# Patient Record
Sex: Female | Born: 1965 | Race: White | Hispanic: No | Marital: Married | Smoking: Never smoker
Health system: Southern US, Community
[De-identification: ages and names within clinical notes are randomized; demographics above are authoritative.]

## PROBLEM LIST (undated history)

## (undated) DIAGNOSIS — Z803 Family history of malignant neoplasm of breast: Secondary | ICD-10-CM

## (undated) DIAGNOSIS — C801 Malignant (primary) neoplasm, unspecified: Secondary | ICD-10-CM

## (undated) DIAGNOSIS — Z9221 Personal history of antineoplastic chemotherapy: Secondary | ICD-10-CM

## (undated) DIAGNOSIS — I10 Essential (primary) hypertension: Secondary | ICD-10-CM

## (undated) DIAGNOSIS — R011 Cardiac murmur, unspecified: Secondary | ICD-10-CM

## (undated) DIAGNOSIS — D759 Disease of blood and blood-forming organs, unspecified: Secondary | ICD-10-CM

## (undated) HISTORY — PX: OTHER SURGICAL HISTORY: SHX169

## (undated) HISTORY — PX: TUBAL LIGATION: SHX77

## (undated) HISTORY — DX: Family history of malignant neoplasm of breast: Z80.3

---

## 2015-01-17 ENCOUNTER — Emergency Department (INDEPENDENT_AMBULATORY_CARE_PROVIDER_SITE_OTHER)
Admission: EM | Admit: 2015-01-17 | Discharge: 2015-01-17 | Disposition: A | Discharge status: Home / Self Care | Payer: No Typology Code available for payment source | Source: Home / Self Care | Attending: Emergency Medicine | Admitting: Emergency Medicine

## 2015-01-17 ENCOUNTER — Encounter (HOSPITAL_COMMUNITY): Payer: Self-pay | Admitting: *Deleted

## 2015-01-17 DIAGNOSIS — I10 Essential (primary) hypertension: Secondary | ICD-10-CM

## 2015-01-17 HISTORY — DX: Essential (primary) hypertension: I10

## 2015-01-17 LAB — POCT I-STAT, CHEM 8
BUN: 18 mg/dL (ref 6–23)
CALCIUM ION: 1.19 mmol/L (ref 1.12–1.23)
Chloride: 105 mmol/L (ref 96–112)
Creatinine, Ser: 0.9 mg/dL (ref 0.50–1.10)
GLUCOSE: 100 mg/dL — AB (ref 70–99)
HCT: 42 % (ref 36.0–46.0)
Hemoglobin: 14.3 g/dL (ref 12.0–15.0)
POTASSIUM: 3.8 mmol/L (ref 3.5–5.1)
SODIUM: 141 mmol/L (ref 135–145)
TCO2: 22 mmol/L (ref 0–100)

## 2015-01-17 MED ORDER — HYDROCHLOROTHIAZIDE 25 MG PO TABS: 25.0000 mg | ORAL_TABLET | Freq: Every day | ORAL | Status: DC

## 2015-01-17 NOTE — Discharge Instructions (Signed)
We are going to change her blood pressure medicine to hydrochlorothiazide. Take 1 pill daily. Check your blood pressure at home several times a week. If it is consistently over 140/90 please come back for medication adjustment. Follow-up with your new doctor as scheduled next month.

## 2015-01-17 NOTE — ED Notes (Signed)
Pt   Requesting  A  Refill  Of  Her  BP   meds          She  Reports  Last  Pm   She  Had  A  Headache     And her  bp is  Elevated     -   She  Is  New  To  The  Area  And  Is   Between  Doctors

## 2015-01-17 NOTE — ED Provider Notes (Signed)
CSN: [id number]     Arrival date & time 01/17/15  1141 History   First MD Initiated Contact with Patient 01/17/15 1153     Chief Complaint  Patient presents with  . Medication Refill   (Consider location/radiation/quality/duration/timing/severity/associated sxs/prior Treatment) HPI She is a 49 year old woman here for medication refill. She is on methyldopa for hypertension. This was started while she was pregnant with her younger son. Her son is now 80 years old and recently stopped nursing. Her prescription is for 500 mg 4 times a day, but she has been taking it twice a day since she realized she was going to have difficulty getting it refilled. She was doing fine on the twice a day dosing, until last night when she felt "bad." She took her blood pressure and it was 158/108. She took a third methyldopa last night, and took her last methyldopa this morning.  She is new to [place]. She are family are likely moving here, but her doctor is still located in [place]. She was unable to get a refill through her doctor in [place].  Past Medical History  Diagnosis Date  . Hypertension    History reviewed. No pertinent past surgical history. History reviewed. No pertinent family history. History  Substance Use Topics  . Smoking status: Never Smoker   . Smokeless tobacco: Not on file  . Alcohol Use: Yes   OB History    No data available     Review of Systems  Eyes: Negative.   Respiratory: Negative.   Cardiovascular: Negative.   Neurological: Negative.     Allergies  Review of patient's allergies indicates no known allergies.  Home Medications   Prior to Admission medications   Medication Sig Start Date End Date Taking? Authorizing Provider  hydrochlorothiazide (HYDRODIURIL) 25 MG tablet Take 1 tablet (25 mg total) by mouth daily. 01/17/15   Melony Overly, MD   BP 132/90 mmHg  Pulse 93  Temp(Src) 99.2 F (37.3 C) (Oral)  Resp 18  SpO2 97%  LMP 12/19/2014 Physical Exam   Constitutional: She is oriented to person, place, and time. She appears well-developed and well-nourished. No distress.  Cardiovascular: Normal rate.   Pulmonary/Chest: Effort normal.  Neurological: She is alert and oriented to person, place, and time.    ED Course  Procedures (including critical care time) Labs Review Labs Reviewed  POCT I-STAT, CHEM 8 - Abnormal; Notable for the following:    Glucose, Bld 100 (*)    All other components within normal limits    Imaging Review No results found.   MDM   1. Essential hypertension    As she is no longer nursing, will change to hydrochlorothiazide 25 mg daily. She has an appointment with a doctor here on March 9. She will monitor her blood pressure at home and follow-up if it is over 140/90.     Melony Overly, MD 01/17/15 1247

## 2015-06-11 ENCOUNTER — Other Ambulatory Visit: Payer: Self-pay

## 2015-06-11 DIAGNOSIS — Z1231 Encounter for screening mammogram for malignant neoplasm of breast: Secondary | ICD-10-CM

## 2015-06-30 ENCOUNTER — Ambulatory Visit
Admission: RE | Admit: 2015-06-30 | Discharge: 2015-06-30 | Disposition: A | Discharge status: Home / Self Care | Payer: No Typology Code available for payment source | Source: Ambulatory Visit

## 2015-06-30 DIAGNOSIS — Z1231 Encounter for screening mammogram for malignant neoplasm of breast: Secondary | ICD-10-CM

## 2016-10-30 ENCOUNTER — Other Ambulatory Visit: Payer: Self-pay | Admitting: Obstetrics & Gynecology

## 2016-10-30 ENCOUNTER — Other Ambulatory Visit (HOSPITAL_COMMUNITY)
Admission: RE | Admit: 2016-10-30 | Discharge: 2016-10-30 | Disposition: A | Discharge status: Home / Self Care | Payer: No Typology Code available for payment source | Source: Ambulatory Visit | Attending: Obstetrics & Gynecology | Admitting: Obstetrics & Gynecology

## 2016-10-30 DIAGNOSIS — Z01419 Encounter for gynecological examination (general) (routine) without abnormal findings: Secondary | ICD-10-CM | POA: Insufficient documentation

## 2016-10-30 DIAGNOSIS — Z1151 Encounter for screening for human papillomavirus (HPV): Secondary | ICD-10-CM | POA: Insufficient documentation

## 2016-11-06 LAB — CYTOLOGY - PAP
DIAGNOSIS: NEGATIVE
HPV: NOT DETECTED

## 2017-12-11 DIAGNOSIS — C50919 Malignant neoplasm of unspecified site of unspecified female breast: Secondary | ICD-10-CM

## 2017-12-11 DIAGNOSIS — Z923 Personal history of irradiation: Secondary | ICD-10-CM

## 2017-12-11 DIAGNOSIS — C801 Malignant (primary) neoplasm, unspecified: Secondary | ICD-10-CM

## 2017-12-11 HISTORY — DX: Malignant neoplasm of unspecified site of unspecified female breast: C50.919

## 2017-12-11 HISTORY — DX: Personal history of irradiation: Z92.3

## 2017-12-11 HISTORY — DX: Malignant (primary) neoplasm, unspecified: C80.1

## 2017-12-11 HISTORY — PX: BREAST LUMPECTOMY: SHX2

## 2018-01-08 ENCOUNTER — Other Ambulatory Visit: Payer: Self-pay | Admitting: Obstetrics & Gynecology

## 2018-01-08 DIAGNOSIS — Z1231 Encounter for screening mammogram for malignant neoplasm of breast: Secondary | ICD-10-CM

## 2018-01-29 ENCOUNTER — Ambulatory Visit
Admission: RE | Admit: 2018-01-29 | Discharge: 2018-01-29 | Disposition: A | Discharge status: Home / Self Care | Payer: BC Managed Care – PPO | Source: Ambulatory Visit | Attending: Obstetrics & Gynecology | Admitting: Obstetrics & Gynecology

## 2018-01-29 DIAGNOSIS — Z1231 Encounter for screening mammogram for malignant neoplasm of breast: Secondary | ICD-10-CM

## 2018-01-30 ENCOUNTER — Other Ambulatory Visit: Payer: Self-pay | Admitting: Obstetrics & Gynecology

## 2018-01-30 DIAGNOSIS — R928 Other abnormal and inconclusive findings on diagnostic imaging of breast: Secondary | ICD-10-CM

## 2018-02-05 ENCOUNTER — Other Ambulatory Visit: Payer: Self-pay | Admitting: Obstetrics & Gynecology

## 2018-02-05 ENCOUNTER — Ambulatory Visit
Admission: RE | Admit: 2018-02-05 | Discharge: 2018-02-05 | Disposition: A | Discharge status: Home / Self Care | Payer: BC Managed Care – PPO | Source: Ambulatory Visit | Attending: Obstetrics & Gynecology | Admitting: Obstetrics & Gynecology

## 2018-02-05 ENCOUNTER — Other Ambulatory Visit: Payer: BC Managed Care – PPO

## 2018-02-05 DIAGNOSIS — R928 Other abnormal and inconclusive findings on diagnostic imaging of breast: Secondary | ICD-10-CM

## 2018-02-05 DIAGNOSIS — N6310 Unspecified lump in the right breast, unspecified quadrant: Secondary | ICD-10-CM

## 2018-02-07 ENCOUNTER — Ambulatory Visit
Admission: RE | Admit: 2018-02-07 | Discharge: 2018-02-07 | Disposition: A | Discharge status: Home / Self Care | Payer: BC Managed Care – PPO | Source: Ambulatory Visit | Attending: Obstetrics & Gynecology | Admitting: Obstetrics & Gynecology

## 2018-02-07 ENCOUNTER — Other Ambulatory Visit: Payer: Self-pay | Admitting: Obstetrics & Gynecology

## 2018-02-07 DIAGNOSIS — N6310 Unspecified lump in the right breast, unspecified quadrant: Secondary | ICD-10-CM

## 2018-02-07 HISTORY — PX: BREAST BIOPSY: SHX20

## 2018-02-08 ENCOUNTER — Other Ambulatory Visit: Payer: Self-pay | Admitting: Internal Medicine

## 2018-02-12 ENCOUNTER — Other Ambulatory Visit: Payer: Self-pay | Admitting: Surgery

## 2018-02-12 DIAGNOSIS — Z853 Personal history of malignant neoplasm of breast: Secondary | ICD-10-CM

## 2018-02-13 ENCOUNTER — Encounter: Payer: Self-pay | Admitting: Radiation Oncology

## 2018-02-13 ENCOUNTER — Telehealth: Payer: Self-pay | Admitting: Oncology

## 2018-02-13 NOTE — Telephone Encounter (Signed)
Appt has been scheduled for the pt to see Dr. Jana Hakim on 3/26 at 4pm. A genetic counseling appt has been scheduled for the pt to see Ferol Luz on 3/26 at 11am. Pt has agreed to the appts. Aware to arrive 30 minutes early for her first appt. Letter mailed.

## 2018-02-14 ENCOUNTER — Other Ambulatory Visit: Payer: Self-pay | Admitting: Surgery

## 2018-02-14 DIAGNOSIS — C50911 Malignant neoplasm of unspecified site of right female breast: Secondary | ICD-10-CM

## 2018-02-21 ENCOUNTER — Ambulatory Visit
Admission: RE | Admit: 2018-02-21 | Discharge: 2018-02-21 | Disposition: A | Discharge status: Home / Self Care | Payer: BC Managed Care – PPO | Source: Ambulatory Visit | Attending: Surgery | Admitting: Surgery

## 2018-02-21 ENCOUNTER — Inpatient Hospital Stay
Admission: RE | Admit: 2018-02-21 | Discharge: 2018-02-21 | Disposition: A | Discharge status: Home / Self Care | Payer: BC Managed Care – PPO | Source: Ambulatory Visit | Attending: Surgery | Admitting: Surgery

## 2018-02-21 DIAGNOSIS — C50911 Malignant neoplasm of unspecified site of right female breast: Secondary | ICD-10-CM

## 2018-02-21 MED ORDER — GADOBENATE DIMEGLUMINE 529 MG/ML IV SOLN
18.0000 mL | Freq: Once | INTRAVENOUS | Status: AC | PRN
  Administered 2018-02-21: 18 mL via INTRAVENOUS

## 2018-02-27 ENCOUNTER — Other Ambulatory Visit: Payer: Self-pay | Admitting: Surgery

## 2018-02-27 DIAGNOSIS — Z853 Personal history of malignant neoplasm of breast: Secondary | ICD-10-CM

## 2018-02-27 NOTE — Progress Notes (Signed)
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  MRI Breast 02/21/2018: 1. Biopsy-proven invasive ductal carcinoma within the upper-outer quadrant of the right breast, with associated biopsy clip, the combination of mass and surrounding/abutting non-mass enhancement measures 2.3 x 2 x 1.9 cm. 2. No evidence of multifocal or multicentric disease within the right breast.  3. No evidence of malignancy within the left breast.  4. No evidence of metastatic lymphadenopathy.  Histology per Pathology Report: Right Breast 02/07/2018    Receptor Status: ER(+ 100%), PR (+ 20%), Her2-neu (-), Ki-67 (10%)  Did patient present with symptoms (if so, please note symptoms) or was this found on screening mammography?: Mammogram done on 01/29/2018 noted possible mass associated with distortion and calcifications in the right breast.  Past/Anticipated interventions by surgeon, if any: Right Breast Patial Mastectomy With Radioactive Seed And Sentinel Lymph Node Biopsy 03/18/2018 with Dr. Ninfa Linden  Past/Anticipated interventions by medical oncology, if any: Chemotherapy  Appointment to see Dr. Jana Hakim on 03/05/2018 at 4pm  Appointment to see Genetic Counselor on 03/05/2018 at 11 am.  Lymphedema issues, if any: No, only in her right arm in relation to the two blood clots.  Both are resolving.  Pain issues, if any: No  BP 116/85 (BP Location: Right Arm, Patient Position: Sitting, Cuff Size: Normal)   Pulse 84   Temp 98.2 F (36.8 C) (Oral)   Resp 16   Ht 5' 4"  (1.626 m)   Wt 189 lb 12.8 oz (86.1 kg)   SpO2 97%   BMI 32.58 kg/m    Wt Readings from Last 3 Encounters:  03/05/18 189 lb 12.8 oz (86.1 kg)    SAFETY ISSUES:  Prior radiation? No  Pacemaker/ICD? No  Possible current  pregnancy? No  Is the patient on methotrexate? No  Current Complaints / other details: Stopped taking birth control pills, 2 blood clots in her right wrist and elbow area.  Prescribed strong dose of aleve to resolve.  Swelling is down.     Cori Razor, RN 02/27/2018,9:16 AM

## 2018-02-28 ENCOUNTER — Other Ambulatory Visit: Payer: Self-pay | Admitting: Surgery

## 2018-02-28 DIAGNOSIS — Z853 Personal history of malignant neoplasm of breast: Secondary | ICD-10-CM

## 2018-02-28 NOTE — Progress Notes (Signed)
[place]  [phone] Fax:([phone]     ID: Anna Conrad DOB: 02/21/1966  MR#: [id number]  JOA#:[id number]  Patient Care Team: Filiberto Pinks as PCP - General (Physician Assistant) Nysha Koplin, Virgie Dad, MD as Consulting Physician (Oncology) Kyung Rudd, MD as Consulting Physician (Radiation Oncology) [name], MD as Consulting Physician (General Surgery) Janyth Pupa, DO as Consulting Physician (Obstetrics and Gynecology) Janyth Pupa, DO as Consulting Physician (Obstetrics and Gynecology) OTHER MD:  CHIEF COMPLAINT: Estrogen receptor positive breast cancer  CURRENT TREATMENT: awaiting definitive surgery   HISTORY OF CURRENT ILLNESS: Anna Conrad had routine screening mammography on 01/29/2018 showing a possible mass with associated calcifications in the right breast. She underwent unilateral right diagnostic mammography with tomography and right breast ultrasonography at [facility] on 02/05/2018 showing a highly suspicious mass over the 10:30 position upper outer of the right breast located 6 cm from the nipple measuring 1.3 x 1.4 x 1.5 cm. Associated microcalcifications corresponding to the mammographic abnormality. Ultrasound of the right axilla was normal.  Accordingly on 02/29/2019 she proceeded to biopsy of the right breast area in question. 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 Prognostic indicators significant for: estrogen receptor, 100% positive with strong staining intensity and progesterone receptor, 20% positive iwht moderate staining intensity. Proliferation marker Ki67 at 10%. HER2 not amplified with ratios HER2/CEP17 signals 1.17 and average HER2 copies per cell 2.45.  On 02/21/2018 the patient had bilateral breast MRI.  This measured the upper outer quadrant right breast mass at 2.3 cm including an area of surrounding  non-masslike enhancement.  There was no evidence of multifocal or multicentric disease, no lymphadenopathy, and no findings in the left breast.  The patient's subsequent history is as detailed below.  INTERVAL HISTORY: Anna Conrad was evaluated in the breast cancer clinic on 03/05/2018 accompanied by her husband, Anna Conrad. Her case was also presented at the multidisciplinary breast cancer conference on 02/13/2018. At that time a preliminary plan was proposed: Genetics referral, breast conserving surgery, Oncotype or Mammaprint   REVIEW OF SYSTEMS: There were no specific symptoms leading to the original mammogram, which was routinely scheduled. The patient denies unusual headaches, visual changes, nausea, vomiting, stiff neck, dizziness, or gait imbalance. There has been no cough, phlegm production, or pleurisy, no chest pain or pressure, and no change in bowel or bladder habits. The patient denies fever, rash, bleeding, unexplained fatigue or unexplained weight loss.  She does not exercise regularly.  A detailed review of systems was otherwise entirely negative.  PAST MEDICAL HISTORY: Past Medical History:  Diagnosis Date  . Family history of breast cancer   . Hypertension   She used to have migraines. She denies GERD, asthma, emphysema. She was told that she had a slight heart murmur. She denies heart palpitations.   PAST SURGICAL HISTORY: Past Surgical History:  Procedure Laterality Date  . cesearean section      2 Caesarian sections- tubal ligation at the second c-section.  FAMILY HISTORY Family History  Problem Relation Age of Onset  . Breast cancer Sister 80       approximate  . Cancer Father 52       started in shoulder, spread to spine, brain, lung  . Cancer Paternal [name]        'blood cancer'  . Heart attack Paternal Uncle   . Breast cancer Cousin        age dx unk  .  Breast cancer Cousin        age dx unk  . Breast cancer Cousin        age dx unk   The patient's father  passed away in the summer 2018 at the age of 54 due to metastatic lung cancer. He was a previous smoker. The patient's mother is alive at age 63. The patient has 1 brother and 1 sister. The patient's sister was diagnosed with breast cancer at the age of 9.  The patient has 3 paternal cousins diagnosed with breast cancer. She denies a family history of ovarian cancer.    GYNECOLOGIC HISTORY:  The patient's periods currently are about twice a month.   Menarche: 52 years old. She used to be on a dance team.  Age at first live birth: 52 years old She is [place] P2. She was taking oral contraceptives, but this has been discontinued as of January 2019. She had bilateral tubal ligation at the time of her second c-section.    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  She has a PhD.  Her husband, Anna Conrad, is a Chief Strategy Officer for [employer], making metal parts. He travels about 2-3 times per year. Their sons are Anna Conrad age 34, and Anna Conrad age 62. She attends [employer].     ADVANCED DIRECTIVES:    HEALTH MAINTENANCE: Social History   Tobacco Use  . Smoking status: Never Smoker  . Smokeless tobacco: Never Used  Substance Use Topics  . Alcohol use: Yes  . Drug use: Never     Colonoscopy:n/a  PAP: 2017/ normal  Bone density: n/a   No Known Allergies  Current Outpatient Medications  Medication Sig Dispense Refill  . hydrochlorothiazide (HYDRODIURIL) 25 MG tablet Take 1 tablet (25 mg total) by mouth daily. 30 tablet 1  . Multiple Vitamins-Minerals (MULTIVITAMIN WITH MINERALS) tablet Take 1 tablet by mouth daily.     No current facility-administered medications for this visit.     OBJECTIVE: Middle-aged white woman who appears well  Vitals:   03/05/18 1557  BP: 121/83  Pulse: 72  Resp: 18  Temp: 98.4 F (36.9 C)  SpO2: 100%     Body mass index is 32.75 kg/m.   Wt Readings from Last 3 Encounters:  03/05/18 190 lb 12.8 oz (86.5 kg)    03/05/18 189 lb 12.8 oz (86.1 kg)      ECOG FS:0 - Asymptomatic  Ocular: Sclerae unicteric, pupils round and equal Ear-nose-throat: Oropharynx clear and moist Lymphatic: No cervical or supraclavicular adenopathy Lungs no rales or rhonchi Heart regular rate and rhythm Abd soft, nontender, positive bowel sounds MSK no focal spinal tenderness, no joint edema Neuro: non-focal, well-oriented, appropriate affect Breasts: I do not palpate a mass in the right breast.  There are no skin or nipple changes of concern.  The left breast is benign.  Both axillae are benign.   LAB RESULTS:  CMP     Component Value Date/Time   NA 141 01/17/2015 1237   K 3.8 01/17/2015 1237   CL 105 01/17/2015 1237   GLUCOSE 100 (H) 01/17/2015 1237   BUN 18 01/17/2015 1237   CREATININE 0.90 01/17/2015 1237    No results found for: TOTALPROTELP, ALBUMINELP, A1GS, A2GS, BETS, BETA2SER, GAMS, MSPIKE, SPEI  No results found for: KPAFRELGTCHN, LAMBDASER, KAPLAMBRATIO  Lab Results  Component Value Date   HGB 14.3 01/17/2015   HCT 42.0 01/17/2015    _0 @  No results  found for: LABCA2  No components found for: [id number]  No results for input(s): INR in the last 168 hours.  No results found for: LABCA2  No results found for: RKY706  No results found for: CBJ628  No results found for: BTD176  No results found for: CA2729  No components found for: HGQUANT  No results found for: CEA1 / No results found for: CEA1   No results found for: AFPTUMOR  No results found for: CHROMOGRNA  No results found for: PSA1  No visits with results within 3 Day(s) from this visit.  Latest known visit with results is:  Orders Only on 10/30/2016  Component Date Value Ref Range Status  . Adequacy 10/30/2016 Satisfactory for evaluation  endocervical/transformation zone component PRESENT.   Final  . Diagnosis 10/30/2016 NEGATIVE FOR INTRAEPITHELIAL LESIONS OR MALIGNANCY.   Final  . Diagnosis  10/30/2016 A LETTER WAS SENT TO THE PATIENT INFORMING HER OF THE ABOVE RESULTS.   Final  . HPV 10/30/2016 NOT DETECTED   Final   Normal Reference Range - NOT Detected  . Material Submitted 10/30/2016 CervicoVaginal Pap [ThinPrep Imaged]   Final    (this displays the last labs from the last 3 days)  No results found for: TOTALPROTELP, ALBUMINELP, A1GS, A2GS, BETS, BETA2SER, GAMS, MSPIKE, SPEI (this displays SPEP labs)  No results found for: KPAFRELGTCHN, LAMBDASER, KAPLAMBRATIO (kappa/lambda light chains)  No results found for: HGBA, HGBA2QUANT, HGBFQUANT, HGBSQUAN (Hemoglobinopathy evaluation)   No results found for: LDH  No results found for: IRON, TIBC, IRONPCTSAT (Iron and TIBC)  No results found for: FERRITIN  Urinalysis No results found for: COLORURINE, APPEARANCEUR, LABSPEC, PHURINE, GLUCOSEU, HGBUR, BILIRUBINUR, KETONESUR, PROTEINUR, UROBILINOGEN, NITRITE, LEUKOCYTESUR   STUDIES: Mr Breast Bilateral W Wo Contrast Inc Cad  Result Date: 02/21/2018 CLINICAL DATA:  Recent diagnosis of invasive ductal carcinoma within the right breast. LABS:  GFR 75, creatinine 0.9 EXAM: BILATERAL BREAST MRI WITH AND WITHOUT CONTRAST TECHNIQUE: Multiplanar, multisequence MR images of both breasts were obtained prior to and following the intravenous administration of 18 ml of MultiHance. THREE-DIMENSIONAL MR IMAGE RENDERING ON INDEPENDENT WORKSTATION: Three-dimensional MR images were rendered by post-processing of the original MR data on an independent workstation. The three-dimensional MR images were interpreted, and findings are reported in the following complete MRI report for this study. Three dimensional images were evaluated at the independent DynaCad workstation COMPARISON:  Previous exam(s). FINDINGS: Breast composition: c. Heterogeneous fibroglandular tissue. Background parenchymal enhancement: Minimal Right breast: Irregular enhancing mass within the upper-outer quadrant of the right breast,  at posterior depth, main component measuring 1.5 x 1.3 x 1.3 cm (transverse by AP by craniocaudal dimensions respectively), with associated biopsy clip consistent with biopsy-proven invasive ductal carcinoma. Additional non-mass enhancement surrounding/abutting the dominant mass extends the transverse measurement to 2.3 cm, the AP dimension to 2 cm and the craniocaudal measurement to 1.9 cm. No additional enhancing mass, non-mass enhancement or secondary signs of malignancy within the right breast. Left breast: No mass or abnormal enhancement. Lymph nodes: No abnormal appearing lymph nodes. Ancillary findings:  None. IMPRESSION: 1. Biopsy-proven invasive ductal carcinoma within the upper-outer quadrant of the right breast, with associated biopsy clip, the combination of mass and surrounding/abutting non-mass enhancement measures 2.3 x 2 x 1.9 cm. 2. No evidence of multifocal or multicentric disease within the right breast. 3. No evidence of malignancy within the left breast. 4. No evidence of metastatic lymphadenopathy. RECOMMENDATION: Per current treatment plan for patient's biopsy-proven right breast cancer. BI-RADS CATEGORY  6: Known  biopsy-proven malignancy. Electronically Signed   By: Franki Cabot M.D.   On: 02/21/2018 11:26   US Breast Ltd Uni Right Inc Axilla  Result Date: 02/05/2018 CLINICAL DATA:  Patient presents for additional views of the right breast as followup to a recent screening exam to evaluate a possible mass. Family history breast cancer in her sister at age 37. EXAM: DIGITAL DIAGNOSTIC right MAMMOGRAM WITH TOMO ULTRASOUND right BREAST COMPARISON:  Previous exam(s). ACR Breast Density Category c: The breast tissue is heterogeneously dense, which may obscure small masses. FINDINGS: Additional images demonstrate a spiculated mass with associated microcalcifications over the upper outer quadrant of the right breast in the posterior third. Targeted ultrasound is performed, showing a hypoechoic  mass with irregular shape and margins as well as intrinsic microcalcifications at the 10:30 position of the right breast 6 cm from the nipple corresponding to the mammographic abnormality. This measures 1.3 x 1.4 x 1.5 cm. Ultrasound of the right axilla is within normal. IMPRESSION: Highly suspicious mass over the 10:30 position of the right breast 6 cm from the nipple measuring 1.3 x 1.4 x 1.5 cm. RECOMMENDATION: Recommend ultrasound-guided core needle biopsy for further evaluation. I have discussed the findings and recommendations with the patient. Results were also provided in writing at the conclusion of the visit. If applicable, a reminder letter will be sent to the patient regarding the next appointment. BI-RADS CATEGORY  5: Highly suggestive of malignancy. Biopsy to be scheduled here at the [facility] prior to patient's departure. Electronically Signed   By: Marin Olp M.D.   On: 02/05/2018 08:49   Mm Diag Breast Tomo Uni Right  Result Date: 02/05/2018 CLINICAL DATA:  Patient presents for additional views of the right breast as followup to a recent screening exam to evaluate a possible mass. Family history breast cancer in her sister at age 3. EXAM: DIGITAL DIAGNOSTIC right MAMMOGRAM WITH TOMO ULTRASOUND right BREAST COMPARISON:  Previous exam(s). ACR Breast Density Category c: The breast tissue is heterogeneously dense, which may obscure small masses. FINDINGS: Additional images demonstrate a spiculated mass with associated microcalcifications over the upper outer quadrant of the right breast in the posterior third. Targeted ultrasound is performed, showing a hypoechoic mass with irregular shape and margins as well as intrinsic microcalcifications at the 10:30 position of the right breast 6 cm from the nipple corresponding to the mammographic abnormality. This measures 1.3 x 1.4 x 1.5 cm. Ultrasound of the right axilla is within normal. IMPRESSION: Highly suspicious mass over the 10:30 position of  the right breast 6 cm from the nipple measuring 1.3 x 1.4 x 1.5 cm. RECOMMENDATION: Recommend ultrasound-guided core needle biopsy for further evaluation. I have discussed the findings and recommendations with the patient. Results were also provided in writing at the conclusion of the visit. If applicable, a reminder letter will be sent to the patient regarding the next appointment. BI-RADS CATEGORY  5: Highly suggestive of malignancy. Biopsy to be scheduled here at the [facility] prior to patient's departure. Electronically Signed   By: Marin Olp M.D.   On: 02/05/2018 08:49   Mm Clip Placement Right  Result Date: 02/07/2018 CLINICAL DATA:  Post ultrasound-guided core needle biopsy of right breast mass. EXAM: DIAGNOSTIC RIGHT MAMMOGRAM POST ULTRASOUND BIOPSY COMPARISON:  Previous exam(s). FINDINGS: Mammographic images were obtained following ultrasound guided biopsy of right breast mass. Two-view mammography demonstrates presence of ribbon shaped marker in expected mammographic position quadrant, posterior depth. IMPRESSION: Successful placement of ribbon shaped marker post  ultrasound-guided core needle biopsy of the right breast. Final Assessment: Post Procedure Mammograms for Marker Placement Electronically Signed   By: Fidela Salisbury M.D.   On: 02/07/2018 08:27   [place] Rt Breast Bx W Loc Dev 1st Lesion Img Bx Spec US Guide  Addendum Date: 02/08/2018   ADDENDUM REPORT: 02/08/2018 12:44 ADDENDUM: Pathology revealed GRADE II INVASIVE DUCTAL CARCINOMA, DUCTAL CARCINOMA IN SITU of the Right breast, 10:30 o'clock. This was found to be concordant by Dr. Fidela Salisbury. Pathology results were discussed with the patient by telephone. The patient reported doing well after the biopsy with tenderness at the site. Post biopsy instructions and care were reviewed and questions were answered. The patient was encouraged to call [facility] for any additional concerns. Surgical  consultation has been arranged with Dr. Nedra Hai at [facility] on February 12, 2018. Pathology results reported by Terie Purser, RN on 02/08/2018. Electronically Signed   By: Fidela Salisbury M.D.   On: 02/08/2018 12:44   Result Date: 02/08/2018 CLINICAL DATA:  Right breast 10:30 o'clock mass. EXAM: ULTRASOUND GUIDED RIGHT BREAST CORE NEEDLE BIOPSY COMPARISON:  Previous exam(s). FINDINGS: I met with the patient and we discussed the procedure of ultrasound-guided biopsy, including benefits and alternatives. We discussed the high likelihood of a successful procedure. We discussed the risks of the procedure, including infection, bleeding, tissue injury, clip migration, and inadequate sampling. Informed written consent was given. The usual time-out protocol was performed immediately prior to the procedure. Lesion quadrant: Upper outer quadrant Using sterile technique and 1% Lidocaine as local anesthetic, under direct ultrasound visualization, a 14 gauge spring-loaded device was used to perform biopsy of right breast 10:30 o'clock mass using a inferior approach. At the conclusion of the procedure a ribbon shaped tissue marker clip was deployed into the biopsy cavity. Follow up 2 view mammogram was performed and dictated separately. IMPRESSION: Ultrasound guided biopsy of right breast. No apparent complications. Electronically Signed: By: Fidela Salisbury M.D. On: 02/07/2018 08:19    ELIGIBLE FOR AVAILABLE RESEARCH PROTOCOL: no  ASSESSMENT: 52 y.o. [place], [place] woman status post right breast upper outer quadrant biopsy 02/07/2018 for a clinical T1c-T2 N0, stage IA-B invasive ductal carcinoma, grade 2, estrogen receptor and progesterone receptor positive, HER-2 not amplified, with an MIB-1 of 10%  (1) breast conserving surgery 03/18/2018  (2) Mammaprint to be obtained from the definitive surgical sample: Chemotherapy not anticipated  (3) adjuvant radiation to follow  (4) antiestrogens to  follow at the completion of local treatment  (5) genetics testing 03/05/2018  PLAN: We spent the better part of today's hour-long appointment discussing the biology of her diagnosis and the specifics of her situation. We first reviewed the fact that cancer is not one disease but more than 100 different diseases and that it is important to keep them separate-- otherwise when friends and relatives discuss their own cancer experiences with Solyana confusion can result. Similarly we explained that if breast cancer spreads to the bone or liver, the patient would not have bone cancer or liver cancer, but breast cancer in the bone and breast cancer in the liver: one cancer in three places-- not 3 different cancers which otherwise would have to be treated in 3 different ways.  We discussed the difference between local and systemic therapy. In terms of loco-regional treatment, lumpectomy plus radiation is equivalent to mastectomy as far as survival is concerned. For this reason, and because the cosmetic results are generally superior, we recommend breast  conserving surgery.   We also noted that in terms of sequencing of treatments, whether systemic therapy or surgery is done first does not affect the ultimate outcome.  This would only be relevant if she turns out to have a deleterious mutation and decides to have bilateral mastectomies.  In that case she likely would want to take some time to make a definitive decision and might want to start antiestrogens preop  We then discussed the rationale for systemic therapy. There is some risk that this cancer may have already spread to other parts of her body. Patients frequently ask at this point about bone scans, CAT scans and PET scans to find out if they have occult breast cancer somewhere else. The problem is that in early stage disease we are much more likely to find false positives then true cancers and this would expose the patient to unnecessary procedures as well  as unnecessary radiation. Scans cannot answer the question the patient really would like to know, which is whether she has microscopic disease elsewhere in her body. For those reasons we do not recommend them.  Of course we would proceed to aggressive evaluation of any symptoms that might suggest metastatic disease, but that is not the case here.  Next we went over the options for systemic therapy which are anti-estrogens, anti-HER-2 immunotherapy, and chemotherapy. Sarie does not meet criteria for anti-HER-2 immunotherapy. She is a good candidate for anti-estrogens.  The question of chemotherapy is more complicated. Chemotherapy is most effective in rapidly growing, aggressive tumors. It is much less effective in low-grade, slow growing cancers, like Janvi 's. For that reason we are going to request a Mammaprint from the definitive surgical sample, as suggested by NCCN guidelines. That will help [place] make a definitive decision regarding chemotherapy in this case.  The hand does qualify for genetics testing.  In patients who carry a deleterious mutation [for example in a  BRCA gene], the risk of a new breast cancer developing in the future may be sufficiently great that the patient may choose bilateral mastectomies. However if she wishes to keep her breasts in that situation it is safe to do so. That would require intensified screening, which generally means not only yearly mammography but a yearly breast MRI as well. Of course, if there is a deleterious mutation bilateral oophorectomy would be necessary as there is no standard screening protocol for ovarian cancer.  Ensured at this point the plan is to proceed to breast conserving surgery with sentinel lymph node sampling, then obtain a Mammaprint which hopefully will be low risk and at that point the patient could proceed to adjuvant radiation.  At the completion of local treatment she would start antiestrogens.  If she does carry a deleterious mutation  and is interested in exploring the possibility of bilateral mastectomies, then most likely we would postpone the initial surgery so she can learn more about reconstruction options and she would go on antiestrogens pending a definitive decision  Satia has a good understanding of the overall plan. She agrees with it. She knows the goal of treatment in her case is cure. She will call with any problems that may develop before her next visit here.   Rayner Erman, Virgie Dad, MD  03/05/18 5:06 PM Medical Oncology and Hematology [facility] [address] [place], [place] [address] Tel. [phone]    Fax. [phone]  This document serves as a record of services personally performed by Lurline Del, MD. It was created on  his behalf by Sheron Nightingale, a trained medical scribe. The creation of this record is based on the scribe's personal observations and the provider's statements to them.   I have reviewed the above documentation for accuracy and completeness, and I agree with the above.

## 2018-03-04 ENCOUNTER — Ambulatory Visit: Payer: BC Managed Care – PPO | Admitting: Radiation Oncology

## 2018-03-05 ENCOUNTER — Inpatient Hospital Stay: Payer: BC Managed Care – PPO | Attending: Oncology | Admitting: Oncology

## 2018-03-05 ENCOUNTER — Inpatient Hospital Stay: Payer: BC Managed Care – PPO

## 2018-03-05 ENCOUNTER — Ambulatory Visit
Admission: RE | Admit: 2018-03-05 | Discharge: 2018-03-05 | Disposition: A | Discharge status: Home / Self Care | Payer: BC Managed Care – PPO | Source: Ambulatory Visit | Attending: Radiation Oncology | Admitting: Radiation Oncology

## 2018-03-05 ENCOUNTER — Inpatient Hospital Stay: Payer: BC Managed Care – PPO | Admitting: Genetics

## 2018-03-05 ENCOUNTER — Encounter: Payer: Self-pay | Admitting: Radiation Oncology

## 2018-03-05 ENCOUNTER — Other Ambulatory Visit: Payer: Self-pay

## 2018-03-05 ENCOUNTER — Encounter: Payer: Self-pay | Admitting: Genetics

## 2018-03-05 VITALS — BP 116/85 | HR 84 | Temp 98.2°F | Resp 16 | Ht 64.0 in | Wt 189.8 lb

## 2018-03-05 DIAGNOSIS — D0511 Intraductal carcinoma in situ of right breast: Secondary | ICD-10-CM

## 2018-03-05 DIAGNOSIS — C50411 Malignant neoplasm of upper-outer quadrant of right female breast: Secondary | ICD-10-CM | POA: Diagnosis not present

## 2018-03-05 DIAGNOSIS — Z803 Family history of malignant neoplasm of breast: Secondary | ICD-10-CM | POA: Insufficient documentation

## 2018-03-05 DIAGNOSIS — Z170 Estrogen receptor positive status [ER+]: Secondary | ICD-10-CM | POA: Diagnosis not present

## 2018-03-05 DIAGNOSIS — I10 Essential (primary) hypertension: Secondary | ICD-10-CM | POA: Insufficient documentation

## 2018-03-05 DIAGNOSIS — Z79899 Other long term (current) drug therapy: Secondary | ICD-10-CM | POA: Insufficient documentation

## 2018-03-05 NOTE — Progress Notes (Signed)
REFERRING PROVIDER: [name], MD [place], [place] [address]  PRIMARY PROVIDER:  Janyth Pupa, DO  PRIMARY REASON FOR VISIT:  1. Malignant neoplasm of upper-outer quadrant of right breast in female, estrogen receptor positive ([place])   2. Family history of breast cancer     HISTORY OF PRESENT ILLNESS:   Anna Conrad, a 52 y.o. female, was seen for a [facility] cancer genetics consultation at the request of Dr. Ninfa Linden due to a personal and family history of cancer.  [name] presents to clinic today to discuss the possibility of a hereditary predisposition to cancer, genetic testing, and to further clarify her future cancer risks, as well as potential cancer risks for family members.   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 Ms. Stiefel has a   Right breast partial mastectomy with radioactive seed and sentinel lymph node biopsy currently scheduled for 03/18/2018.    HORMONAL RISK FACTORS:  Menarche was at age patient cannot recall age.   First live birth at age 6.  Ovaries intact: yes.  Hysterectomy: no.  Colonoscopy: no; not examined. Mammogram within the last year: yes. Number of breast biopsies: 0.   Past Medical History:  Diagnosis Date  . Family history of breast cancer   . Hypertension     No past surgical history on file.  Social History   Socioeconomic History  . Marital status: Married    Spouse name: Not on file  . Number of children: Not on file  . Years of education: Not on file  . Highest education level: Not on file  Occupational History  . Not on file  Social Needs  . Financial resource strain: Not on file  . Food insecurity:    Worry: Not on file    Inability: Not on file  . Transportation needs:    Medical: Not on file    Non-medical: Not on file  Tobacco Use  . Smoking status: Never Smoker  Substance and  Sexual Activity  . Alcohol use: Yes  . Drug use: Not on file  . Sexual activity: Not on file  Lifestyle  . Physical activity:    Days per week: Not on file    Minutes per session: Not on file  . Stress: Not on file  Relationships  . Social connections:    Talks on phone: Not on file    Gets together: Not on file    Attends religious service: Not on file    Active member of club or organization: Not on file    Attends meetings of clubs or organizations: Not on file    Relationship status: Not on file  Other Topics Concern  . Not on file  Social History Narrative  . Not on file     FAMILY HISTORY:  We obtained a detailed, 4-generation family history.  Significant diagnoses are listed below: Family History  Problem Relation Age of Onset  . Breast cancer Sister 10       approximate  . Cancer Father 27       started in shoulder, spread to spine, brain, lung  . Cancer Paternal [name]        'blood cancer'  . Heart attack Paternal Uncle   . Breast cancer Cousin        age dx unk  . Breast cancer Cousin  age dx unk  . Breast cancer Cousin        age dx unk   [name] has 57 and 71 year-old sons.  [name] has a sister who is currently 50 who developed breast cancer at about 50 years of age.  She has no children and has not had genetic testing. [name] also has a brother who is 30 with no history of cancer. He has 3 daughters.   [name] father died at 59 due to cancer.  She reports it started in his shoulder and then spread to his lung, brain, and spine.  [name] has 1 paternal aunt with no history of cancer. This aunt had 6-7 children.  3 of these children (all female) developed breast cancer.  The age of diagnosis for these cousins is unk.  [name] has a paternal uncle who had a blood cancer and later died due to a heart attack.  [name] has 2 other paternal uncles with no history of cancer.  [name] paternal grandparents had no history of cancer.    [name] mother is 8 with no history of cancer.  She had a hysterectomy.  [name] has 7 maternal aunts and 2 maternal uncles with no history of cancer.  [name] reports no history of any cancer in her many maternal cousins.  [name] maternal grandfather died at a young/mid-life age due to a motor vehicle accident.  [name] maternal grandmother lived into 'old age46 with no history of cancer.   Ms. Garling is unaware of previous family history of genetic testing for hereditary cancer risks. Patient's maternal ancestors are of Northern European descent, and paternal ancestors are of English/Irish descent. There is no reported Ashkenazi Jewish ancestry. There is no known consanguinity.  GENETIC COUNSELING ASSESSMENT: [name] is a 52 y.o. female with a personal and family history which is somewhat suggestive of a Hereditary Cancer Predisposition Syndrome. We, therefore, discussed and recommended the following at today's visit.   DISCUSSION: We reviewed the characteristics, features and inheritance patterns of hereditary cancer syndromes. We also discussed genetic testing, including the appropriate family members to test, the process of testing, insurance coverage and turn-around-time for results. We discussed the implications of a negative, positive and/or variant of uncertain significant result. In order to get genetic test results in a timely manner so that [name] can use these genetic test results for surgical decisions, we recommended [name] pursue genetic testing for the Breast Cancer STAT Panel. We then recommend [name] pursue reflex genetic testing to the Common Hereditary Cancers gene panel. The Common Hereditary Cancer Panel offered by [facility] includes sequencing and/or deletion duplication testing of the following 47 genes: APC, ATM, AXIN2, BARD1, BMPR1A, BRCA1, BRCA2, BRIP1, CDH1, CDKN2A (p14ARF), CDKN2A (p16INK4a), CKD4, CHEK2, CTNNA1, DICER1, EPCAM  (Deletion/duplication testing only), GREM1 (promoter region deletion/duplication testing only), KIT, MEN1, MLH1, MSH2, MSH3, MSH6, MUTYH, NBN, NF1, NHTL1, PALB2, PDGFRA, PMS2, POLD1, POLE, PTEN, RAD50, RAD51C, RAD51D, SDHB, SDHC, SDHD, SMAD4, SMARCA4. STK11, TP53, TSC1, TSC2, and VHL.  The following genes were evaluated for sequence changes only: SDHA and HOXB13 c.251G>A variant only.  We discussed that only 5-10% of cancers are associated with a Hereditary cancer predisposition syndrome.  One of the most common hereditary cancer syndromes that increases breast cancer risk is called Hereditary Breast and Ovarian Cancer (HBOC) syndrome.  This syndrome is caused by mutations in the BRCA1 and BRCA2 genes.  This syndrome increases an individual's  lifetime risk to develop breast, ovarian, pancreatic, and other types of cancer.  There are also many other cancer predisposition syndromes caused by mutations in several other genes.  We discussed that if she is found to have a mutation in one of these genes, it may impact surgical decisions, and alter future medical management recommendations such as increased cancer screenings and consideration of risk reducing surgeries.  A positive result could also have implications for the patient's family members.  A Negative result would mean we were unable to identify a hereditary component to her cancer, but does not rule out the possibility of a hereditary basis for her cancer.  There could be mutations that are undetectable by current technology, or in genes not yet tested or identified to increase cancer risk.    We discussed the potential to find a Variant of Uncertain Significance or VUS.  These are variants that have not yet been identified as pathogenic or benign, and it is unknown if this variant is associated with increased cancer risk or if this is a normal finding.  Most VUS's are reclassified to benign or likely benign.   It should not be used to make medical  management decisions. With time, we suspect the lab will determine the significance of any VUS's identified if any.   Based on Ms. Kelliher's personal and family history of cancer, she meets medical criteria for genetic testing. Despite that she meets criteria, she may still have an out of pocket cost. We discussed that if her out of pocket cost for testing is over $100, the laboratory will call and confirm whether she wants to proceed with testing.  If the out of pocket cost of testing is less than $100 she will be billed by the genetic testing laboratory.   We discussed that some people do not want to undergo genetic testing due to fear of genetic discrimination.  A federal law called the Genetic Information Non-Discrimination Act (GINA) of 2008 helps protect individuals against genetic discrimination based on their genetic test results.  It impacts both health insurance and employment.  For health insurance, it protects against increased premiums, being kicked off insurance or being forced to take a test in order to be insured.  For employment it protects against hiring, firing and promoting decisions based on genetic test results.  Health status due to a cancer diagnosis is not protected under GINA.  This law does not protect life insurance, disability insurance, or other types of insurance.   PLAN: After considering the risks, benefits, and limitations, Ms. Cisek  provided informed consent to pursue genetic testing and the blood sample was sent to [facility] for analysis of the Breast Cancer STAT Panel with plans to pursue reflex testing to the Common Hereditary Cancers Panel. Preliminary results should be available within approximately 5-12 days' time, at which point they will be disclosed by telephone to Ms. Daniel, as will any additional recommendations warranted by these results. Ms. Liston will receive a summary of her genetic counseling visit and a copy of her results once available. This  information will also be available in Epic. We encouraged Ms. Genson to remain in contact with cancer genetics annually so that we can continuously update the family history and inform her of any changes in cancer genetics and testing that may be of benefit for her family. Ms. Crear questions were answered to her satisfaction today. Our contact information was provided should additional questions or concerns arise.  Based on Ms. Heavrin's  family history, we recommended her sister and/or paternal cousins with breast cancer, have genetic counseling and testing. Ms. Hunsberger will let us know if we can be of any assistance in coordinating genetic counseling and/or testing for this family member.   Lastly, we encouraged Ms. Voth to remain in contact with cancer genetics annually so that we can continuously update the family history and inform her of any changes in cancer genetics and testing that may be of benefit for this family.   Ms.  Boerema questions were answered to her satisfaction today. Our contact information was provided should additional questions or concerns arise. Thank you for the referral and allowing [place] to share in the care of your patient.   Tana Felts, MS, [facility] Certified Genetic Counselor [email or web address] phone: [phone]  The patient was seen for a total of 35 minutes in face-to-face genetic counseling.  The patient was accompanied today by her husband.  This patient was discussed with Drs. Magrinat, Lindi Adie and/or Burr Medico who agrees with the above.

## 2018-03-05 NOTE — Progress Notes (Signed)
Radiation Oncology         [phone] ________________________________  Name: Anna Conrad        MRN: [id number]  Date of Service: 03/05/2018 DOB: [birth year]  YI:RSWN, Anderson Malta, DO  [name], MD     REFERRING PHYSICIAN: [name], MD   DIAGNOSIS: The encounter diagnosis was Malignant neoplasm of upper-outer quadrant of right breast in female, estrogen receptor positive ([place]).   HISTORY OF PRESENT ILLNESS: Anna Conrad is a 52 y.o. female seen at the request of Dr. Ninfa Linden for new diagnosis of right breast cancer. The patient was found to have a mass in the right breast on screening mammogram in February 2019. Diagnostic ultrasound on 02/05/18 revealed a 1.3 x 1.4 x 1.5 cm at 10:30. The axilla was negative for adenopathy. She underwent biopsy on 02/07/18 revealing a grade 2 invasive ductal carcinoma with DCIS, ER/PR positive, HER2 negative. She also underwent an MRI of bilateral breasts revealing a 1.5 x 1.3 x 1.3 cm mass in the upper outer quadrant of the right breast, as well as non mass enhancement abutting the dominant mass but extending 2.3 x 2 x 1.9 cm. No adenopathy was noted and no masses or enhancement was seen in the left breast. She's met with genetic counseling today and is going to proceed with testing. She is tentatively scheduled for lumpectomy on 03/18/18, and will meet with Dr. Jana Hakim this afternoon as well. She comes today to review options of radiotherapy.   PREVIOUS RADIATION THERAPY: No   PAST MEDICAL HISTORY:  Past Medical History:  Diagnosis Date  . Family history of breast cancer   . Hypertension        PAST SURGICAL HISTORY: Past Surgical History:  Procedure Laterality Date  . cesearean section       FAMILY HISTORY:  Family History  Problem Relation Age of Onset  . Breast cancer Sister 75       approximate  . Cancer Father 38       started in shoulder, spread to spine, brain, lung  . Cancer Paternal [name]    'blood cancer'  . Heart attack Paternal Uncle   . Breast cancer Cousin        age dx unk  . Breast cancer Cousin        age dx unk  . Breast cancer Cousin        age dx unk     SOCIAL HISTORY:  reports that she has never smoked. She has never used smokeless tobacco. She reports that she drinks alcohol. She reports that she does not use drugs. The patient is married and lives in [place]. She has  10 and 50 year old boys. She is accompanied by her husband.  ALLERGIES: Patient has no known allergies.   MEDICATIONS:  Current Outpatient Medications  Medication Sig Dispense Refill  . hydrochlorothiazide (HYDRODIURIL) 25 MG tablet Take 1 tablet (25 mg total) by mouth daily. 30 tablet 1   No current facility-administered medications for this encounter.      REVIEW OF SYSTEMS: On review of systems, the patient reports that she is doing well overall. She denies any chest pain, shortness of breath, cough, fevers, chills, night sweats, unintended weight changes. She denies any bowel or bladder disturbances, and denies abdominal pain, nausea or vomiting. She denies any new musculoskeletal or joint aches or pains. A complete review of systems is obtained and is otherwise negative.     PHYSICAL EXAM:  Wt Readings from  Last 3 Encounters:  03/05/18 189 lb 12.8 oz (86.1 kg)   Temp Readings from Last 3 Encounters:  03/05/18 98.2 F (36.8 C) (Oral)  01/17/15 99.2 F (37.3 C) (Oral)   BP Readings from Last 3 Encounters:  03/05/18 116/85  01/17/15 132/90   Pulse Readings from Last 3 Encounters:  03/05/18 84  01/17/15 93   Pain Assessment Pain Score: 0-No pain/10  In general this is a well appearing caucasian female in no acute distress. She is alert and oriented x4 and appropriate throughout the examination. HEENT reveals that the patient is normocephalic, atraumatic. EOMs are intact. PERRLA. Skin is intact without any evidence of gross lesions. Cardiopulmonary assessment is negative for  acute distress and she exhibits normal effort. Breast exam is deferred at this time.    ECOG = 0  0 - Asymptomatic (Fully active, able to carry on all predisease activities without restriction)  1 - Symptomatic but completely ambulatory (Restricted in physically strenuous activity but ambulatory and able to carry out work of a light or sedentary nature. For example, light housework, office work)  2 - Symptomatic, <50% in bed during the day (Ambulatory and capable of all self care but unable to carry out any work activities. Up and about more than 50% of waking hours)  3 - Symptomatic, >50% in bed, but not bedbound (Capable of only limited self-care, confined to bed or chair 50% or more of waking hours)  4 - Bedbound (Completely disabled. Cannot carry on any self-care. Totally confined to bed or chair)  5 - Death   Eustace Pen MM, Creech RH, Tormey DC, et al. [phone]). "Toxicity and response criteria of the [facility]". [place] Oncol. 5 (6): 649-55    LABORATORY DATA:  Lab Results  Component Value Date   HGB 14.3 01/17/2015   HCT 42.0 01/17/2015   Lab Results  Component Value Date   NA 141 01/17/2015   K 3.8 01/17/2015   CL 105 01/17/2015   No results found for: ALT, AST, GGT, ALKPHOS, BILITOT    RADIOGRAPHY: Mr Breast Bilateral W Wo Contrast Inc Cad  Result Date: 02/21/2018 CLINICAL DATA:  Recent diagnosis of invasive ductal carcinoma within the right breast. LABS:  GFR 75, creatinine 0.9 EXAM: BILATERAL BREAST MRI WITH AND WITHOUT CONTRAST TECHNIQUE: Multiplanar, multisequence MR images of both breasts were obtained prior to and following the intravenous administration of 18 ml of MultiHance. THREE-DIMENSIONAL MR IMAGE RENDERING ON INDEPENDENT WORKSTATION: Three-dimensional MR images were rendered by post-processing of the original MR data on an independent workstation. The three-dimensional MR images were interpreted, and findings are reported in the  following complete MRI report for this study. Three dimensional images were evaluated at the independent DynaCad workstation COMPARISON:  Previous exam(s). FINDINGS: Breast composition: c. Heterogeneous fibroglandular tissue. Background parenchymal enhancement: Minimal Right breast: Irregular enhancing mass within the upper-outer quadrant of the right breast, at posterior depth, main component measuring 1.5 x 1.3 x 1.3 cm (transverse by AP by craniocaudal dimensions respectively), with associated biopsy clip consistent with biopsy-proven invasive ductal carcinoma. Additional non-mass enhancement surrounding/abutting the dominant mass extends the transverse measurement to 2.3 cm, the AP dimension to 2 cm and the craniocaudal measurement to 1.9 cm. No additional enhancing mass, non-mass enhancement or secondary signs of malignancy within the right breast. Left breast: No mass or abnormal enhancement. Lymph nodes: No abnormal appearing lymph nodes. Ancillary findings:  None. IMPRESSION: 1. Biopsy-proven invasive ductal carcinoma within the upper-outer quadrant of the  right breast, with associated biopsy clip, the combination of mass and surrounding/abutting non-mass enhancement measures 2.3 x 2 x 1.9 cm. 2. No evidence of multifocal or multicentric disease within the right breast. 3. No evidence of malignancy within the left breast. 4. No evidence of metastatic lymphadenopathy. RECOMMENDATION: Per current treatment plan for patient's biopsy-proven right breast cancer. BI-RADS CATEGORY  6: Known biopsy-proven malignancy. Electronically Signed   By: Franki Cabot M.D.   On: 02/21/2018 11:26   US Breast Ltd Uni Right Inc Axilla  Result Date: 02/05/2018 CLINICAL DATA:  Patient presents for additional views of the right breast as followup to a recent screening exam to evaluate a possible mass. Family history breast cancer in her sister at age 60. EXAM: DIGITAL DIAGNOSTIC right MAMMOGRAM WITH TOMO ULTRASOUND right BREAST  COMPARISON:  Previous exam(s). ACR Breast Density Category c: The breast tissue is heterogeneously dense, which may obscure small masses. FINDINGS: Additional images demonstrate a spiculated mass with associated microcalcifications over the upper outer quadrant of the right breast in the posterior third. Targeted ultrasound is performed, showing a hypoechoic mass with irregular shape and margins as well as intrinsic microcalcifications at the 10:30 position of the right breast 6 cm from the nipple corresponding to the mammographic abnormality. This measures 1.3 x 1.4 x 1.5 cm. Ultrasound of the right axilla is within normal. IMPRESSION: Highly suspicious mass over the 10:30 position of the right breast 6 cm from the nipple measuring 1.3 x 1.4 x 1.5 cm. RECOMMENDATION: Recommend ultrasound-guided core needle biopsy for further evaluation. I have discussed the findings and recommendations with the patient. Results were also provided in writing at the conclusion of the visit. If applicable, a reminder letter will be sent to the patient regarding the next appointment. BI-RADS CATEGORY  5: Highly suggestive of malignancy. Biopsy to be scheduled here at the [facility] prior to patient's departure. Electronically Signed   By: Marin Olp M.D.   On: 02/05/2018 08:49   Mm Diag Breast Tomo Uni Right  Result Date: 02/05/2018 CLINICAL DATA:  Patient presents for additional views of the right breast as followup to a recent screening exam to evaluate a possible mass. Family history breast cancer in her sister at age 42. EXAM: DIGITAL DIAGNOSTIC right MAMMOGRAM WITH TOMO ULTRASOUND right BREAST COMPARISON:  Previous exam(s). ACR Breast Density Category c: The breast tissue is heterogeneously dense, which may obscure small masses. FINDINGS: Additional images demonstrate a spiculated mass with associated microcalcifications over the upper outer quadrant of the right breast in the posterior third. Targeted ultrasound is  performed, showing a hypoechoic mass with irregular shape and margins as well as intrinsic microcalcifications at the 10:30 position of the right breast 6 cm from the nipple corresponding to the mammographic abnormality. This measures 1.3 x 1.4 x 1.5 cm. Ultrasound of the right axilla is within normal. IMPRESSION: Highly suspicious mass over the 10:30 position of the right breast 6 cm from the nipple measuring 1.3 x 1.4 x 1.5 cm. RECOMMENDATION: Recommend ultrasound-guided core needle biopsy for further evaluation. I have discussed the findings and recommendations with the patient. Results were also provided in writing at the conclusion of the visit. If applicable, a reminder letter will be sent to the patient regarding the next appointment. BI-RADS CATEGORY  5: Highly suggestive of malignancy. Biopsy to be scheduled here at the [facility] prior to patient's departure. Electronically Signed   By: Marin Olp M.D.   On: 02/05/2018 08:49   Mm Clip Placement  Right  Result Date: 02/07/2018 CLINICAL DATA:  Post ultrasound-guided core needle biopsy of right breast mass. EXAM: DIAGNOSTIC RIGHT MAMMOGRAM POST ULTRASOUND BIOPSY COMPARISON:  Previous exam(s). FINDINGS: Mammographic images were obtained following ultrasound guided biopsy of right breast mass. Two-view mammography demonstrates presence of ribbon shaped marker in expected mammographic position quadrant, posterior depth. IMPRESSION: Successful placement of ribbon shaped marker post ultrasound-guided core needle biopsy of the right breast. Final Assessment: Post Procedure Mammograms for Marker Placement Electronically Signed   By: Fidela Salisbury M.D.   On: 02/07/2018 08:27   [place] Rt Breast Bx W Loc Dev 1st Lesion Img Bx Spec US Guide  Addendum Date: 02/08/2018   ADDENDUM REPORT: 02/08/2018 12:44 ADDENDUM: Pathology revealed GRADE II INVASIVE DUCTAL CARCINOMA, DUCTAL CARCINOMA IN SITU of the Right breast, 10:30 o'clock. This was found to be  concordant by Dr. Fidela Salisbury. Pathology results were discussed with the patient by telephone. The patient reported doing well after the biopsy with tenderness at the site. Post biopsy instructions and care were reviewed and questions were answered. The patient was encouraged to call [facility] for any additional concerns. Surgical consultation has been arranged with Dr. Nedra Hai at [facility] on February 12, 2018. Pathology results reported by Terie Purser, RN on 02/08/2018. Electronically Signed   By: Fidela Salisbury M.D.   On: 02/08/2018 12:44   Result Date: 02/08/2018 CLINICAL DATA:  Right breast 10:30 o'clock mass. EXAM: ULTRASOUND GUIDED RIGHT BREAST CORE NEEDLE BIOPSY COMPARISON:  Previous exam(s). FINDINGS: I met with the patient and we discussed the procedure of ultrasound-guided biopsy, including benefits and alternatives. We discussed the high likelihood of a successful procedure. We discussed the risks of the procedure, including infection, bleeding, tissue injury, clip migration, and inadequate sampling. Informed written consent was given. The usual time-out protocol was performed immediately prior to the procedure. Lesion quadrant: Upper outer quadrant Using sterile technique and 1% Lidocaine as local anesthetic, under direct ultrasound visualization, a 14 gauge spring-loaded device was used to perform biopsy of right breast 10:30 o'clock mass using a inferior approach. At the conclusion of the procedure a ribbon shaped tissue marker clip was deployed into the biopsy cavity. Follow up 2 view mammogram was performed and dictated separately. IMPRESSION: Ultrasound guided biopsy of right breast. No apparent complications. Electronically Signed: By: Fidela Salisbury M.D. On: 02/07/2018 08:19       IMPRESSION/PLAN: 1. 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 Dr. Lisbeth Renshaw discusses the  pathology findings and reviews the nature of invasive breast disease. The consensus from the breast conference included meeting with genetic counseling which she has done. If she does not proceed with mastectomy, she would be a candidate for breast conservation with lumpectomy and sentinel node biopsy which she's scheduled for on 03/18/18. Her tumor would be tested for oncotype dx score to determine a role for systemic therapy. Provided that chemotherapy is not indicated, the patient's course would then be followed by external radiotherapy to the breast followed by antiestrogen therapy. We discussed the risks, benefits, short, and long term effects of radiotherapy, and the patient is interested in proceeding if she proceeds with lumpectomy. Dr. Lisbeth Renshaw discusses the delivery and logistics of radiotherapy and anticipates a course of 6 1/2 weeks. We will see her back about 2 weeks after surgery to move forward with the simulation and planning process and anticipate starting radiotherapy about 4 weeks after surgery.  2.  Possible genetic predisposition to malignancy. The patient is a candidate for testing and has decided to proceed. This would influence her decisions for surgery. She will proceed surgically once we know the results of her testing.  In a visit lasting 45 minutes, greater than 50% of the time was spent face to face discussing her case, and coordinating the patient's care.  The above documentation reflects my direct findings during this shared patient visit. Please see the separate note by Dr. Lisbeth Renshaw on this date for the remainder of the patient's plan of care.    Carola Rhine, PAC

## 2018-03-06 ENCOUNTER — Encounter: Payer: Self-pay | Admitting: General Practice

## 2018-03-06 ENCOUNTER — Encounter: Payer: Self-pay | Admitting: *Deleted

## 2018-03-06 ENCOUNTER — Telehealth: Payer: Self-pay | Admitting: Oncology

## 2018-03-06 NOTE — Telephone Encounter (Signed)
Per 3/26 no los 

## 2018-03-06 NOTE — Progress Notes (Signed)
[facility] Psychosocial Distress Screening Clinical Social Work  Clinical Social Work was referred by distress screening protocol.  The patient scored a 6 on the Psychosocial Distress Thermometer which indicates moderate distress. Clinical Social Worker Edwyna Shell to assess for distress and other psychosocial needs. Unable to talk today, requested CSW call before 10 AM during her planning period.  CSW mailed information packet to patient's home.    ONCBCN DISTRESS SCREENING 03/05/2018  Screening Type Initial Screening  Distress experienced in past week (1-10) 6  Practical problem type Insurance;Work/school;Transportation    Clinical Social Worker follow up needed: Yes.    If yes, follow up plan:   Call tomorrow before 10.  Edwyna Shell, LCSW Clinical Social Worker Phone:  [phone]

## 2018-03-07 ENCOUNTER — Encounter: Payer: Self-pay | Admitting: General Practice

## 2018-03-07 NOTE — Progress Notes (Signed)
[facility] Psychosocial Distress Screening Clinical Social Work  Clinical Social Work was referred by distress screening protocol.  The patient scored a 6 on the Psychosocial Distress Thermometer which indicates moderate distress. Clinical Social Worker Edwyna Shell to assess for distress and other psychosocial needs. CSW and patient discussed common feeling and emotions when being diagnosed with cancer, and the importance of support during treatment. CSW informed patient of the support team and support services at [facility]. CSW provided contact information and encouraged patient to call with any questions or concerns.  Discussed concerns over financial impact of cancer diagnosis, concerned about being able to afford treatment.  Has children ages 48 and 70, has discussed diagnosis w children.  Is having trouble w transportation, will refer to [facility] for help post surgery.  Will mail information packet for [place].    ONCBCN DISTRESS SCREENING 03/05/2018  Screening Type Initial Screening  Distress experienced in past week (1-10) 6  Practical problem type Insurance;Work/school;Transportation     Clinical Social Worker follow up needed: Yes.    If yes, follow up plan: Reconnect after surgery to discuss needs at that time.    Edwyna Shell, LCSW Clinical Social Worker Phone:  [phone]

## 2018-03-07 NOTE — Progress Notes (Signed)
[name] CSW Progress Notes  Patient referred to [facility], agency will reach out to patient and patient will inform them when she needs to schedule rides for treatment ' Anna Conrad, [place] Worker Phone:  [phone]

## 2018-03-11 NOTE — Pre-Procedure Instructions (Signed)
Anna Conrad  03/11/2018      [facility] ([facility]), [place] [address] [place] [address] Phone: [phone] Fax: [phone]    Your procedure is scheduled on April 8  Report to [facility] at [place].M.  Call this number if you have problems the morning of surgery:  [phone]   Remember:  Do not eat food or drink liquids after midnight.  Continue all medications as directed by your physician except follow these medication instructions before surgery below  Please complete your PRE-SURGERY ENSURE that was given to before you leave your house the morning of surgery.  Please, if able, drink it in one setting. DO NOT SIP.   Take these medicines the morning of surgery with A SIP OF WATER NONE  7 days prior to surgery STOP taking any Aspirin(unless otherwise instructed by your surgeon), Aleve, Naproxen, Ibuprofen, Motrin, Advil, Goody's, BC's, all herbal medications, fish oil, and all vitamins    Do not wear jewelry, make-up or nail polish.  Do not wear lotions, powders, or perfumes, or deodorant.  Do not shave 48 hours prior to surgery.    Do not bring valuables to the hospital.  [facility] is not responsible for any belongings or valuables.  Contacts, dentures or bridgework may not be worn into surgery.  Leave your suitcase in the car.  After surgery it may be brought to your room.  For patients admitted to the hospital, discharge time will be determined by your treatment team.  Patients discharged the day of surgery will not be allowed to drive home.   Special instructions:   [facility]- Preparing For Surgery  Before surgery, you can play an important role. Because skin is not sterile, your skin needs to be as free of germs as possible. You can reduce the number of germs on your skin by washing with CHG (chlorahexidine gluconate) Soap before surgery.  CHG is an antiseptic cleaner which  kills germs and bonds with the skin to continue killing germs even after washing.  Please do not use if you have an allergy to CHG or antibacterial soaps. If your skin becomes reddened/irritated stop using the CHG.  Do not shave (including legs and underarms) for at least 48 hours prior to first CHG shower. It is OK to shave your face.  Please follow these instructions carefully.   1. Shower the NIGHT BEFORE SURGERY and the MORNING OF SURGERY with CHG.   2. If you chose to wash your hair, wash your hair first as usual with your normal shampoo.  3. After you shampoo, rinse your hair and body thoroughly to remove the shampoo.  4. Use CHG as you would any other liquid soap. You can apply CHG directly to the skin and wash gently with a scrungie or a clean washcloth.   5. Apply the CHG Soap to your body ONLY FROM THE NECK DOWN.  Do not use on open wounds or open sores. Avoid contact with your eyes, ears, mouth and genitals (private parts). Wash Face and genitals (private parts)  with your normal soap.  6. Wash thoroughly, paying special attention to the area where your surgery will be performed.  7. Thoroughly rinse your body with warm water from the neck down.  8. DO NOT shower/wash with your normal soap after using and rinsing off the CHG Soap.  9. Pat yourself dry with a CLEAN TOWEL.  10. Wear CLEAN PAJAMAS to bed the night before surgery, wear comfortable clothes the morning of surgery  11. Place CLEAN SHEETS on your bed the night of your first shower and DO NOT SLEEP WITH PETS.    Day of Surgery: Do not apply any deodorants/lotions. Please wear clean clothes to the hospital/surgery center.      Please read over the following fact sheets that you were given.

## 2018-03-12 ENCOUNTER — Encounter (HOSPITAL_COMMUNITY): Payer: Self-pay

## 2018-03-12 ENCOUNTER — Other Ambulatory Visit: Payer: Self-pay

## 2018-03-12 ENCOUNTER — Encounter (HOSPITAL_COMMUNITY)
Admission: RE | Admit: 2018-03-12 | Discharge: 2018-03-12 | Disposition: A | Discharge status: Home / Self Care | Payer: BC Managed Care – PPO | Source: Ambulatory Visit | Attending: Surgery | Admitting: Surgery

## 2018-03-12 DIAGNOSIS — R9431 Abnormal electrocardiogram [ECG] [EKG]: Secondary | ICD-10-CM | POA: Insufficient documentation

## 2018-03-12 DIAGNOSIS — Z01818 Encounter for other preprocedural examination: Secondary | ICD-10-CM | POA: Diagnosis not present

## 2018-03-12 DIAGNOSIS — I10 Essential (primary) hypertension: Secondary | ICD-10-CM | POA: Insufficient documentation

## 2018-03-12 DIAGNOSIS — I498 Other specified cardiac arrhythmias: Secondary | ICD-10-CM | POA: Diagnosis not present

## 2018-03-12 DIAGNOSIS — Z01812 Encounter for preprocedural laboratory examination: Secondary | ICD-10-CM | POA: Diagnosis not present

## 2018-03-12 HISTORY — DX: Disease of blood and blood-forming organs, unspecified: D75.9

## 2018-03-12 HISTORY — DX: Cardiac murmur, unspecified: R01.1

## 2018-03-12 LAB — BASIC METABOLIC PANEL
Anion gap: 9 (ref 5–15)
BUN: 13 mg/dL (ref 6–20)
CO2: 27 mmol/L (ref 22–32)
Calcium: 9.2 mg/dL (ref 8.9–10.3)
Chloride: 101 mmol/L (ref 101–111)
Creatinine, Ser: 0.98 mg/dL (ref 0.44–1.00)
GFR calc Af Amer: >60 mL/min (ref >60)
GLUCOSE: 89 mg/dL (ref 65–99)
POTASSIUM: 3.4 mmol/L — AB (ref 3.5–5.1)
Sodium: 137 mmol/L (ref 135–145)

## 2018-03-12 LAB — CBC
HEMATOCRIT: 41.8 % (ref 36.0–46.0)
Hemoglobin: 13.6 g/dL (ref 12.0–15.0)
MCH: 30.8 pg (ref 26.0–34.0)
MCHC: 32.5 g/dL (ref 30.0–36.0)
MCV: 94.6 fL (ref 78.0–100.0)
Platelets: 306 10*3/uL (ref 150–400)
RBC: 4.42 MIL/uL (ref 3.87–5.11)
RDW: 12.2 % (ref 11.5–15.5)
WBC: 7.3 10*3/uL (ref 4.0–10.5)

## 2018-03-13 ENCOUNTER — Telehealth: Payer: Self-pay | Admitting: Genetics

## 2018-03-14 ENCOUNTER — Encounter: Payer: Self-pay | Admitting: Genetics

## 2018-03-14 ENCOUNTER — Ambulatory Visit: Payer: Self-pay | Admitting: Genetics

## 2018-03-14 DIAGNOSIS — Z803 Family history of malignant neoplasm of breast: Secondary | ICD-10-CM

## 2018-03-14 DIAGNOSIS — C50411 Malignant neoplasm of upper-outer quadrant of right female breast: Secondary | ICD-10-CM

## 2018-03-14 DIAGNOSIS — Z170 Estrogen receptor positive status [ER+]: Secondary | ICD-10-CM

## 2018-03-14 DIAGNOSIS — Z1379 Encounter for other screening for genetic and chromosomal anomalies: Secondary | ICD-10-CM

## 2018-03-14 NOTE — Progress Notes (Signed)
HPI: Anna Conrad was previously seen in the [employer] [facility] clinic on 03/05/2018 due to a personal and family history of breast cancer and concerns regarding a hereditary predisposition to cancer. Please refer to our prior cancer genetics clinic note for more information regarding Anna Conrad medical, social and family histories, and our assessment and recommendations, at the time. Anna Conrad recent genetic test results were disclosed to her, as well as recommendations warranted by these results. These results and recommendations are discussed in more detail below.  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 Anna Conrad has a   Right breast partial mastectomy with radioactive seed and sentinel lymph node biopsy currently scheduled for 03/18/2018.   CANCER HISTORY:    Malignant neoplasm of upper-outer quadrant of right breast in female, estrogen receptor positive ([place])   03/05/2018 Initial Diagnosis    Malignant neoplasm of upper-outer quadrant of right breast in female, estrogen receptor positive ([place])      03/13/2018 Genetic Testing    The Common Hereditary Cancer Panel offered by [facility] includes sequencing and/or deletion duplication testing of the following 47 genes: APC, ATM, AXIN2, BARD1, BMPR1A, BRCA1, BRCA2, BRIP1, CDH1, CDKN2A (p14ARF), CDKN2A (p16INK4a), CKD4, CHEK2, CTNNA1, DICER1, EPCAM (Deletion/duplication testing only), GREM1 (promoter region deletion/duplication testing only), KIT, MEN1, MLH1, MSH2, MSH3, MSH6, MUTYH, NBN, NF1, NHTL1, PALB2, PDGFRA, PMS2, POLD1, POLE, PTEN, RAD50, RAD51C, RAD51D, SDHB, SDHC, SDHD, SMAD4, SMARCA4. STK11, TP53, TSC1, TSC2, and VHL.  The following genes were evaluated for sequence changes only: SDHA and HOXB13 c.251G>A variant only.  Results: No pathogenic variants identified. A variant of uncertain significance (VUS) in the gene MSH3 was also  identified c.1027+4T>C (Intronic).  VUS's should not be used to impact medical management.  The date of this test report is 03/13/2018.         FAMILY HISTORY:  We obtained a detailed, 4-generation family history.  Significant diagnoses are listed below: Family History  Problem Relation Age of Onset  . Breast cancer Sister 71       approximate  . Cancer Father 28       started in shoulder, spread to spine, brain, lung  . Cancer Paternal [name]        'blood cancer'  . Heart attack Paternal Uncle   . Breast cancer Cousin        age dx unk  . Breast cancer Cousin        age dx unk  . Breast cancer Cousin        age dx unk   Anna Conrad has 48 and 35 year-old sons.  Anna Conrad has a sister who is currently 38 who developed breast cancer at about 50 years of age.  She has no children and has not had genetic testing. Anna Conrad also has a brother who is 87 with no history of cancer. He has 3 daughters.   Anna Conrad father died at 76 due to cancer.  She reports it started in his shoulder and then spread to his lung, brain, and spine.  Anna Conrad has 1 paternal aunt with no history of cancer. This aunt had 6-7 children.  3 of these children (all female) developed breast cancer.  The age of diagnosis for these cousins is unk.  Anna Conrad has a paternal uncle who had a blood cancer and later died due to a heart attack.  Anna Conrad  has 2 other paternal uncles with no history of cancer.  Anna Conrad paternal grandparents had no history of cancer.   Anna Conrad mother is 33 with no history of cancer.  She had a hysterectomy.  Anna Conrad has 7 maternal aunts and 2 maternal uncles with no history of cancer.  Anna Conrad reports no history of any cancer in her many maternal cousins.  Anna Conrad maternal grandfather died at a young/mid-life age due to a motor vehicle accident.  Anna Conrad maternal grandmother lived into 'old age73 with no history of cancer.   Anna Conrad is unaware of previous family  history of genetic testing for hereditary cancer risks. Patient's maternal ancestors are of Northern European descent, and paternal ancestors are of English/Irish descent. There is no reported Ashkenazi Jewish ancestry. There is no known consanguinity.  GENETIC TEST RESULTS: Genetic testing performed through [facility]'s Common Hereditary Cancers Panel reported out on 03/13/2018 showed no pathogenic mutations. The Common Hereditary Cancer Panel offered by [facility] includes sequencing and/or deletion duplication testing of the following 47 genes: APC, ATM, AXIN2, BARD1, BMPR1A, BRCA1, BRCA2, BRIP1, CDH1, CDKN2A (p14ARF), CDKN2A (p16INK4a), CKD4, CHEK2, CTNNA1, DICER1, EPCAM (Deletion/duplication testing only), GREM1 (promoter region deletion/duplication testing only), KIT, MEN1, MLH1, MSH2, MSH3, MSH6, MUTYH, NBN, NF1, NHTL1, PALB2, PDGFRA, PMS2, POLD1, POLE, PTEN, RAD50, RAD51C, RAD51D, SDHB, SDHC, SDHD, SMAD4, SMARCA4. STK11, TP53, TSC1, TSC2, and VHL.  The following genes were evaluated for sequence changes only: SDHA and HOXB13 c.251G>A variant only..  A variant of uncertain significance (VUS) in a gene called MSH3 was also noted. c.1027+4T>C (Intronic).  The test report will be scanned into EPIC and will be located under the Molecular Pathology section of the Results Review tab.A portion of the result report is included below for reference.      We discussed with Anna Conrad that because current genetic testing is not perfect, it is possible there may be a gene mutation in one of these genes that current testing cannot detect, but that chance is small. We also discussed, that there could be another gene that has not yet been discovered, or that we have not yet tested, that is responsible for the cancer diagnoses in the family. It is also possible there is a hereditary cause for the cancer in the family that Anna Conrad did not inherit and therefore was not identified in her testing.  Therefore, it is  important to remain in touch with cancer genetics in the future so that we can continue to offer Ms. Hamler the most up to date genetic testing.   Regarding the VUS in MSH3: At this time, it is unknown if this variant is associated with increased cancer risk or if this is a normal finding, but most variants such as this get reclassified to being inconsequential. It should not be used to make medical management decisions. With time, we suspect the lab will determine the significance of this variant, if any. If we do learn more about it, we will try to contact Ms. Kennard to discuss it further. However, it is important to stay in touch with [place] periodically and keep the address and phone number up to date.  ADDITIONAL GENETIC TESTING: We discussed with Ms. Edgin that there are other genes that are associated with increased cancer risk that can be analyzed. The laboratories that offer this testing look at these additional genes via a hereditary cancer gene panel. Should Ms. Decker wish to pursue additional genetic testing, we are happy to discuss  and coordinate this testing, at any time.    CANCER SCREENING RECOMMENDATIONS: Ms. Mount test result is considered negative (normal).  This means that we have not identified a hereditary cause for personal and family history of cancer at this time.   While reassuring, this does not definitively rule out a hereditary basis for her cancer. It is still possible that there could be genetic mutations that are undetectable by current technology, or genetic mutations in genes that have not been tested or identified to increase cancer risk.  It is also possible there is a hereditary cause for the cancer in her family that Ms. Bassinger did not inherit, and therefore was not found in her genetic testing. Therefore, it is recommended she continue to follow the cancer management and screening guidelines provided by her oncology and primary healthcare provider. Other factors such  as her personal and family history may still affect her cancer risk.  RECOMMENDATIONS FOR FAMILY MEMBERS: Women in this family might be at some increased risk of developing cancer, over the general population risk, simply due to the family history of cancer. We recommended women in this family have a yearly mammogram beginning at age 58, or 18 years younger than the earliest onset of cancer, an annual clinical breast exam, and perform monthly breast self-exams.  Women in this family should also have a gynecological exam as recommended by their primary provider. All family members should have a colonoscopy by age 77.  All family members should inform their physicians about the family history of cancer so their doctors can make the most appropriate screening recommendations for them.   Based on Ms. Morine's family history, we recommended her siblings and all paternal relatives (especailly those affected with cancer), have genetic counseling and testing. Ms. Selinger will let us know if we can be of any assistance in coordinating genetic counseling and/or testing for these family members.   FOLLOW-UP: Lastly, we discussed with Ms. Buel that cancer genetics is a rapidly advancing field and it is possible that new genetic tests will be appropriate for her and/or her family members in the future. We encouraged her to remain in contact with cancer genetics on an annual basis so we can update her personal and family histories and let her know of advances in cancer genetics that may benefit this family.   Our contact number was provided. Ms. Hachey questions were answered to her satisfaction, and she knows she is welcome to call us at anytime with additional questions or concerns.   Ferol Luz, MS, [facility] Certified Genetic Counselor [email or web address]

## 2018-03-14 NOTE — Telephone Encounter (Signed)
Revealed negative genetic testing.  Revealed that a VUS in MSH3 was identified.   This normal result is reassuring and indicates that it is unlikely Anna Conrad's cancer is due to a hereditary cause.  It is unlikely that there is an increased risk of another cancer due to a mutation in one of these genes.  However, genetic testing is not perfect, and cannot definitively rule out a hereditary cause.  It will be important for her to keep in contact with genetics to learn if any additional testing may be needed in the future.   Recommended her siblings and any maternal relatives (especially those affected with cancer) also have genetic testing.

## 2018-03-15 ENCOUNTER — Ambulatory Visit
Admission: RE | Admit: 2018-03-15 | Discharge: 2018-03-15 | Disposition: A | Discharge status: Home / Self Care | Payer: BC Managed Care – PPO | Source: Ambulatory Visit | Attending: Surgery | Admitting: Surgery

## 2018-03-15 DIAGNOSIS — Z853 Personal history of malignant neoplasm of breast: Secondary | ICD-10-CM

## 2018-03-17 NOTE — H&P (Signed)
Anna Conrad  Location: [facility] Patient #: [id number] DOB: [birth year] Married / Language: English / Race: White Female   History of Present Illness  The patient is a 52 year old female who presents with breast cancer. This is a 52 year old female referred by Dobrinka Dimitrova after the recent diagnosis of a right breast cancer. This was found on screening mammography. She had a 1.4 cm mass in the upper outer quadrant of the right breast. Ultrasound of her lymph nodes was normal. 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 Receptors status is currently pending. She has no previous history of breast cancer or problems with her breasts. She denies nipple discharge. She has a sister who was diagnosed with node positive breast cancer before the age of 73 and has since had chemotherapy and radiation after surgery for this. There is no other family history of breast cancer although she reports multiple other cancers in her extended family. She is otherwise healthy and without complaints.   Past Surgical History  Breast Biopsy  Right. Cesarean Section - Multiple   Diagnostic Studies History  Colonoscopy  never Mammogram  1-3 years ago  Allergies  No Known Drug Allergies [02/12/2018]:  Medication History  Aviane (0.1-20MG -MCG Tablet, Oral) Active. Lisinopril-Hydrochlorothiazide (10-12.5MG  Tablet, Oral) Active. Medications Reconciled  Social History Alcohol use  Occasional alcohol use. Caffeine use  Carbonated beverages, Coffee, Tea. No drug use  Tobacco use  Never smoker.  Family History  Arthritis  Mother. Breast Cancer  Family Members In General, Sister. Heart disease in female family member before age 40  Hypertension  Brother, Mother. Prostate Cancer  Father. Respiratory Condition  Brother, Father.  Pregnancy / Birth History   Contraceptive History  Oral contraceptives. Irregular  periods  Length (months) of breastfeeding  12-24 Maternal age  6-40 Para  2  Other Problems Breast Cancer  Heart murmur  High blood pressure  Hypercholesterolemia  Other disease, cancer, significant illness  Umbilical Hernia Repair     Review of Systems General Not Present- Appetite Loss, Chills, Fatigue, Fever, Night Sweats, Weight Gain and Weight Loss. Skin Not Present- Change in Wart/Mole, Dryness, Hives, Jaundice, New Lesions, Non-Healing Wounds, Rash and Ulcer. HEENT Not Present- Earache, Hearing Loss, Hoarseness, Nose Bleed, Oral Ulcers, Ringing in the Ears, Seasonal Allergies, Sinus Pain, Sore Throat, Visual Disturbances, Wears glasses/contact lenses and Yellow Eyes. Respiratory Present- Snoring. Not Present- Bloody sputum, Chronic Cough, Difficulty Breathing and Wheezing. Breast Present- Breast Mass. Not Present- Breast Pain, Nipple Discharge and Skin Changes. Cardiovascular Present- Swelling of Extremities. Not Present- Chest Pain, Difficulty Breathing Lying Down, Leg Cramps, Palpitations, Rapid Heart Rate and Shortness of Breath. Gastrointestinal Not Present- Abdominal Pain, Bloating, Bloody Stool, Change in Bowel Habits, Chronic diarrhea, Constipation, Difficulty Swallowing, Excessive gas, Gets full quickly at meals, Hemorrhoids, Indigestion, Nausea, Rectal Pain and Vomiting. Female Genitourinary Not Present- Frequency, Nocturia, Painful Urination, Pelvic Pain and Urgency. Musculoskeletal Present- Swelling of Extremities. Not Present- Back Pain, Joint Pain, Joint Stiffness, Muscle Pain and Muscle Weakness. Neurological Present- Decreased Memory. Not Present- Fainting, Headaches, Numbness, Seizures, Tingling, Tremor, Trouble walking and Weakness. Psychiatric Not Present- Anxiety, Bipolar, Change in Sleep Pattern, Depression, Fearful and Frequent crying. Endocrine Present- Hair Changes. Not Present- Cold Intolerance, Excessive Hunger, Heat Intolerance, Hot flashes and  New Diabetes. Hematology Not Present- Blood Thinners, Easy Bruising, Excessive bleeding, Gland problems, HIV and Persistent Infections.  Vitals  Weight: 190.8 lb Height: 64in Body Surface Area: 1.92 m  Body Mass Index: 32.75 kg/m  Temp.: 98.30F(Oral)  Pulse: 68 (Regular)  BP: 140/82 (Sitting, Left Arm, Standard)    Physical Exam General Mental Status-Alert. General Appearance-Consistent with stated age. Hydration-Well hydrated. Voice-Normal.  Head and Neck Head-normocephalic, atraumatic with no lesions or palpable masses. Trachea-midline. Thyroid Gland Characteristics - normal size and consistency.  Eye Eyeball - Bilateral-Extraocular movements intact. Sclera/Conjunctiva - Bilateral-No scleral icterus.  Chest and Lung Exam Chest and lung exam reveals -quiet, even and easy respiratory effort with no use of accessory muscles and on auscultation, normal breath sounds, no adventitious sounds and normal vocal resonance. Inspection Chest Wall - Normal. Back - normal.  Breast Breast - Left-Symmetric, Non Tender, No Biopsy scars, no Dimpling, No Inflammation, No Lumpectomy scars, No Mastectomy scars, No Peau d' Orange. Breast - Right-Symmetric, Non Tender, No Biopsy scars, no Dimpling, No Inflammation, No Lumpectomy scars, No Mastectomy scars, No Peau d' Orange. Breast Lump-No Palpable Breast Mass. Note: I am uncertain whether I palpate the mass in the right breast. There is no ecchymosis from the biopsy. There are no skin changes   Cardiovascular Cardiovascular examination reveals -normal heart sounds, regular rate and rhythm with no murmurs and normal pedal pulses bilaterally.  Abdomen Inspection Inspection of the abdomen reveals - No Hernias. Skin - Scar - no surgical scars. Palpation/Percussion Palpation and Percussion of the abdomen reveal - Soft, Non Tender, No Rebound tenderness, No Rigidity (guarding) and No  hepatosplenomegaly. Auscultation Auscultation of the abdomen reveals - Bowel sounds normal.  Neurologic Neurologic evaluation reveals -alert and oriented x 3 with no impairment of recent or remote memory. Mental Status-Normal.  Musculoskeletal Normal Exam - Left-Upper Extremity Strength Normal and Lower Extremity Strength Normal. Normal Exam - Right-Upper Extremity Strength Normal and Lower Extremity Strength Normal.  Lymphatic Head & Neck  General Head & Neck Lymphatics: Bilateral - Description - Normal. Axillary  General Axillary Region: Bilateral - Description - Normal. Tenderness - Non Tender. Femoral & Inguinal - Did not examine.    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 I had a long discussion with the patient and her husband in detail. We discussed breast conservation versus mastectomy. We discussed postoperative radiation. We discussed antihormonal therapy and chemotherapy. Her issue currently is the need for genetic counseling given the cancer in her sister. I also recommend bilateral breast MRIs. We will refer her to the breast center to be evaluated by the medical and radiation oncologist as well as genetic counseling. She is interested in breast conservation. I discussed a radioactive seed guided right breast partial mastectomy and sentinel lymph node biopsy with her in detail. We discussed the risk of the surgery as well as detail. We will hold on scheduling until she has been presented at breast conference and further decisions have been made. She and her husband are in agreement with plan  Addendum:  She is negative for genetics related to breast cancer.  No other abnormalities were seen on MRI in either breast.

## 2018-03-17 NOTE — Anesthesia Preprocedure Evaluation (Signed)
Anesthesia Evaluation  Patient identified by MRN, date of birth, ID band Patient awake    Reviewed: Allergy & Precautions, H&P , Patient's Chart, lab work & pertinent test results, reviewed documented beta blocker date and time   Airway Mallampati: II  TM Distance: >3 FB Neck ROM: full    Dental no notable dental hx.    Pulmonary    Pulmonary exam normal breath sounds clear to auscultation       Cardiovascular hypertension,  Rhythm:regular Rate:Normal     Neuro/Psych    GI/Hepatic   Endo/Other    Renal/GU      Musculoskeletal   Abdominal   Peds  Hematology   Anesthesia Other Findings   Reproductive/Obstetrics                             Anesthesia Physical Anesthesia Plan  ASA: II  Anesthesia Plan: General   Post-op Pain Management:  Regional for Post-op pain   Induction: Intravenous  PONV Risk Score and Plan: 2 and Dexamethasone, Ondansetron and Treatment may vary due to age or medical condition  Airway Management Planned: LMA  Additional Equipment:   Intra-op Plan:   Post-operative Plan:   Informed Consent: I have reviewed the patients History and Physical, chart, labs and discussed the procedure including the risks, benefits and alternatives for the proposed anesthesia with the patient or authorized representative who has indicated his/her understanding and acceptance.   Dental Advisory Given  Plan Discussed with: CRNA and Surgeon  Anesthesia Plan Comments: ( )        Anesthesia Quick Evaluation

## 2018-03-18 ENCOUNTER — Encounter (HOSPITAL_COMMUNITY): Payer: Self-pay | Admitting: *Deleted

## 2018-03-18 ENCOUNTER — Ambulatory Visit (HOSPITAL_COMMUNITY)
Admission: RE | Admit: 2018-03-18 | Discharge: 2018-03-18 | Disposition: A | Discharge status: Home / Self Care | Payer: BC Managed Care – PPO | Source: Ambulatory Visit | Attending: Surgery | Admitting: Surgery

## 2018-03-18 ENCOUNTER — Ambulatory Visit
Admission: RE | Admit: 2018-03-18 | Discharge: 2018-03-18 | Disposition: A | Discharge status: Home / Self Care | Payer: BC Managed Care – PPO | Source: Ambulatory Visit | Attending: Surgery | Admitting: Surgery

## 2018-03-18 ENCOUNTER — Encounter (HOSPITAL_COMMUNITY)
Admission: RE | Disposition: A | Discharge status: Home / Self Care | Payer: Self-pay | Source: Ambulatory Visit | Attending: Surgery

## 2018-03-18 ENCOUNTER — Other Ambulatory Visit: Payer: Self-pay

## 2018-03-18 ENCOUNTER — Ambulatory Visit (HOSPITAL_COMMUNITY): Payer: BC Managed Care – PPO | Admitting: Anesthesiology

## 2018-03-18 ENCOUNTER — Ambulatory Visit (HOSPITAL_COMMUNITY): Payer: BC Managed Care – PPO | Admitting: Emergency Medicine

## 2018-03-18 DIAGNOSIS — R011 Cardiac murmur, unspecified: Secondary | ICD-10-CM | POA: Insufficient documentation

## 2018-03-18 DIAGNOSIS — E7800 Pure hypercholesterolemia, unspecified: Secondary | ICD-10-CM | POA: Diagnosis not present

## 2018-03-18 DIAGNOSIS — Z853 Personal history of malignant neoplasm of breast: Secondary | ICD-10-CM

## 2018-03-18 DIAGNOSIS — Z170 Estrogen receptor positive status [ER+]: Secondary | ICD-10-CM | POA: Diagnosis not present

## 2018-03-18 DIAGNOSIS — Z8249 Family history of ischemic heart disease and other diseases of the circulatory system: Secondary | ICD-10-CM | POA: Insufficient documentation

## 2018-03-18 DIAGNOSIS — I10 Essential (primary) hypertension: Secondary | ICD-10-CM | POA: Insufficient documentation

## 2018-03-18 DIAGNOSIS — N6011 Diffuse cystic mastopathy of right breast: Secondary | ICD-10-CM | POA: Diagnosis not present

## 2018-03-18 DIAGNOSIS — Z91040 Latex allergy status: Secondary | ICD-10-CM | POA: Diagnosis not present

## 2018-03-18 DIAGNOSIS — C50411 Malignant neoplasm of upper-outer quadrant of right female breast: Secondary | ICD-10-CM | POA: Diagnosis present

## 2018-03-18 DIAGNOSIS — Z79899 Other long term (current) drug therapy: Secondary | ICD-10-CM | POA: Diagnosis not present

## 2018-03-18 HISTORY — PX: BREAST LUMPECTOMY WITH RADIOACTIVE SEED AND SENTINEL LYMPH NODE BIOPSY: SHX6550

## 2018-03-18 HISTORY — PX: BREAST LUMPECTOMY: SHX2

## 2018-03-18 LAB — POCT PREGNANCY, URINE: Preg Test, Ur: NEGATIVE

## 2018-03-18 SURGERY — BREAST LUMPECTOMY WITH RADIOACTIVE SEED AND SENTINEL LYMPH NODE BIOPSY
Anesthesia: General | Site: Breast | Laterality: Right

## 2018-03-18 MED ORDER — TECHNETIUM TC 99M SULFUR COLLOID FILTERED: 1.0000 | Freq: Once | INTRAVENOUS | Status: DC | PRN

## 2018-03-18 MED ORDER — ACETAMINOPHEN 500 MG PO TABS
ORAL_TABLET | ORAL | Status: AC
  Administered 2018-03-18: 1000 mg
  Filled 2018-03-18: qty 2

## 2018-03-18 MED ORDER — SODIUM CHLORIDE 0.9 % IJ SOLN
INTRAMUSCULAR | Status: AC
  Filled 2018-03-18: qty 10

## 2018-03-18 MED ORDER — CHLORHEXIDINE GLUCONATE CLOTH 2 % EX PADS: 6.0000 | MEDICATED_PAD | Freq: Once | CUTANEOUS | Status: DC

## 2018-03-18 MED ORDER — CELECOXIB 200 MG PO CAPS: 200.0000 mg | ORAL_CAPSULE | ORAL | Status: DC

## 2018-03-18 MED ORDER — FENTANYL CITRATE (PF) 250 MCG/5ML IJ SOLN
INTRAMUSCULAR | Status: AC
  Filled 2018-03-18: qty 5

## 2018-03-18 MED ORDER — DEXAMETHASONE SODIUM PHOSPHATE 10 MG/ML IJ SOLN
INTRAMUSCULAR | Status: DC | PRN
  Administered 2018-03-18: 10 mg via INTRAVENOUS

## 2018-03-18 MED ORDER — 0.9 % SODIUM CHLORIDE (POUR BTL) OPTIME
TOPICAL | Status: DC | PRN
  Administered 2018-03-18: 1000 mL

## 2018-03-18 MED ORDER — GABAPENTIN 300 MG PO CAPS: 300.0000 mg | ORAL_CAPSULE | ORAL | Status: DC

## 2018-03-18 MED ORDER — PHENYLEPHRINE 40 MCG/ML (10ML) SYRINGE FOR IV PUSH (FOR BLOOD PRESSURE SUPPORT)
PREFILLED_SYRINGE | INTRAVENOUS | Status: DC | PRN
  Administered 2018-03-18: 80 ug via INTRAVENOUS
  Administered 2018-03-18: 120 ug via INTRAVENOUS
  Administered 2018-03-18: 80 ug via INTRAVENOUS
  Administered 2018-03-18: 120 ug via INTRAVENOUS
  Administered 2018-03-18: 40 ug via INTRAVENOUS
  Administered 2018-03-18: 80 ug via INTRAVENOUS

## 2018-03-18 MED ORDER — BUPIVACAINE-EPINEPHRINE 0.25% -1:200000 IJ SOLN
INTRAMUSCULAR | Status: DC | PRN
  Administered 2018-03-18: 20 mL

## 2018-03-18 MED ORDER — LIDOCAINE 2% (20 MG/ML) 5 ML SYRINGE
INTRAMUSCULAR | Status: DC | PRN
  Administered 2018-03-18: 100 mg via INTRAVENOUS

## 2018-03-18 MED ORDER — GABAPENTIN 300 MG PO CAPS
ORAL_CAPSULE | ORAL | Status: AC
  Administered 2018-03-18: 300 mg
  Filled 2018-03-18: qty 1

## 2018-03-18 MED ORDER — CEFAZOLIN SODIUM-DEXTROSE 2-4 GM/100ML-% IV SOLN
INTRAVENOUS | Status: AC
  Filled 2018-03-18: qty 100

## 2018-03-18 MED ORDER — PROPOFOL 10 MG/ML IV BOLUS
INTRAVENOUS | Status: DC | PRN
  Administered 2018-03-18: 160 mg via INTRAVENOUS

## 2018-03-18 MED ORDER — BUPIVACAINE-EPINEPHRINE (PF) 0.25% -1:200000 IJ SOLN
INTRAMUSCULAR | Status: AC
  Filled 2018-03-18: qty 30

## 2018-03-18 MED ORDER — CELECOXIB 200 MG PO CAPS
ORAL_CAPSULE | ORAL | Status: AC
  Administered 2018-03-18: 200 mg
  Filled 2018-03-18: qty 1

## 2018-03-18 MED ORDER — LACTATED RINGERS IV SOLN
INTRAVENOUS | Status: DC | PRN
  Administered 2018-03-18 (×2): via INTRAVENOUS

## 2018-03-18 MED ORDER — ONDANSETRON HCL 4 MG/2ML IJ SOLN
INTRAMUSCULAR | Status: DC | PRN
  Administered 2018-03-18: 4 mg via INTRAVENOUS

## 2018-03-18 MED ORDER — FENTANYL CITRATE (PF) 250 MCG/5ML IJ SOLN
INTRAMUSCULAR | Status: DC | PRN
  Administered 2018-03-18 (×2): 25 ug via INTRAVENOUS
  Administered 2018-03-18 (×2): 50 ug via INTRAVENOUS

## 2018-03-18 MED ORDER — METHYLENE BLUE 0.5 % INJ SOLN
INTRAVENOUS | Status: AC
  Filled 2018-03-18: qty 10

## 2018-03-18 MED ORDER — CEFAZOLIN SODIUM-DEXTROSE 2-4 GM/100ML-% IV SOLN
2.0000 g | INTRAVENOUS | Status: AC
  Administered 2018-03-18: 2 g via INTRAVENOUS

## 2018-03-18 MED ORDER — OXYCODONE HCL 5 MG PO TABS: 5.0000 mg | ORAL_TABLET | Freq: Four times a day (QID) | ORAL | 0 refills | Status: DC | PRN

## 2018-03-18 MED ORDER — ACETAMINOPHEN 500 MG PO TABS: 1000.0000 mg | ORAL_TABLET | ORAL | Status: DC

## 2018-03-18 MED ORDER — PROPOFOL 10 MG/ML IV BOLUS
INTRAVENOUS | Status: AC
  Filled 2018-03-18: qty 20

## 2018-03-18 MED ORDER — MIDAZOLAM HCL 2 MG/2ML IJ SOLN
INTRAMUSCULAR | Status: DC | PRN
  Administered 2018-03-18: 2 mg via INTRAVENOUS

## 2018-03-18 MED ORDER — FENTANYL CITRATE (PF) 100 MCG/2ML IJ SOLN: 25.0000 ug | INTRAMUSCULAR | Status: DC | PRN

## 2018-03-18 MED ORDER — MIDAZOLAM HCL 2 MG/2ML IJ SOLN
INTRAMUSCULAR | Status: AC
  Filled 2018-03-18: qty 2

## 2018-03-18 SURGICAL SUPPLY — 39 items
APPLIER CLIP 9.375 MED OPEN (MISCELLANEOUS) ×3
BINDER BREAST XLRG (GAUZE/BANDAGES/DRESSINGS) ×3 IMPLANT
BLADE SURG 15 STRL LF DISP TIS (BLADE) ×1 IMPLANT
BLADE SURG 15 STRL SS (BLADE) ×2
CANISTER SUCT 3000ML PPV (MISCELLANEOUS) ×3 IMPLANT
CHLORAPREP W/TINT 26ML (MISCELLANEOUS) ×3 IMPLANT
CLIP APPLIE 9.375 MED OPEN (MISCELLANEOUS) ×1 IMPLANT
COVER PROBE W GEL 5X96 (DRAPES) ×3 IMPLANT
COVER SURGICAL LIGHT HANDLE (MISCELLANEOUS) ×3 IMPLANT
DERMABOND ADVANCED (GAUZE/BANDAGES/DRESSINGS) ×2
DERMABOND ADVANCED .7 DNX12 (GAUZE/BANDAGES/DRESSINGS) ×1 IMPLANT
DEVICE DUBIN SPECIMEN MAMMOGRA (MISCELLANEOUS) ×3 IMPLANT
DRAPE CHEST BREAST 15X10 FENES (DRAPES) ×3 IMPLANT
DRAPE UTILITY XL STRL (DRAPES) ×3 IMPLANT
ELECT CAUTERY BLADE 6.4 (BLADE) ×3 IMPLANT
ELECT REM PT RETURN 9FT ADLT (ELECTROSURGICAL) ×3
ELECTRODE REM PT RTRN 9FT ADLT (ELECTROSURGICAL) ×1 IMPLANT
GAUZE SPONGE 4X4 12PLY STRL (GAUZE/BANDAGES/DRESSINGS) ×3 IMPLANT
GLOVE SURG SIGNA 7.5 PF LTX (GLOVE) ×3 IMPLANT
GLOVE SURG SS PI 6.5 STRL IVOR (GLOVE) ×3 IMPLANT
GLOVE SURG SS PI 8.0 STRL IVOR (GLOVE) ×3 IMPLANT
GOWN STRL REUS W/ TWL XL LVL3 (GOWN DISPOSABLE) ×2 IMPLANT
GOWN STRL REUS W/TWL XL LVL3 (GOWN DISPOSABLE) ×4
KIT BASIN OR (CUSTOM PROCEDURE TRAY) ×3 IMPLANT
KIT MARKER MARGIN INK (KITS) ×3 IMPLANT
NEEDLE HYPO 25GX1X1/2 BEV (NEEDLE) ×3 IMPLANT
NS IRRIG 1000ML POUR BTL (IV SOLUTION) ×3 IMPLANT
PACK SURGICAL SETUP 50X90 (CUSTOM PROCEDURE TRAY) ×3 IMPLANT
PAD ABD 8X10 STRL (GAUZE/BANDAGES/DRESSINGS) ×3 IMPLANT
PENCIL BUTTON HOLSTER BLD 10FT (ELECTRODE) ×3 IMPLANT
SPONGE LAP 18X18 X RAY DECT (DISPOSABLE) ×3 IMPLANT
SUT MNCRL AB 4-0 PS2 18 (SUTURE) ×3 IMPLANT
SUT VIC AB 3-0 SH 18 (SUTURE) ×3 IMPLANT
SYR BULB 3OZ (MISCELLANEOUS) ×3 IMPLANT
SYR CONTROL 10ML LL (SYRINGE) ×3 IMPLANT
TOWEL GREEN STERILE FF (TOWEL DISPOSABLE) ×3 IMPLANT
TUBE CONNECTING 12'X1/4 (SUCTIONS) ×1
TUBE CONNECTING 12X1/4 (SUCTIONS) ×2 IMPLANT
YANKAUER SUCT BULB TIP NO VENT (SUCTIONS) ×3 IMPLANT

## 2018-03-18 NOTE — Op Note (Signed)
RIGHT BREAST PARTIAL MASTECTOMY WITH RADIOACTIVE SEED AND SENTINEL LYMPH NODE BIOPSY  Procedure Note  Anna Conrad 03/18/2018   Pre-op Diagnosis: RIGHT BREAST CANCER     Post-op Diagnosis: same  Procedure(s): RIGHT BREAST PARTIAL MASTECTOMY WITH RADIOACTIVE SEED AND DEEP AXILLARY SENTINEL LYMPH NODE BIOPSY  Surgeon(s): [name], MD  Anesthesia: General  Staff:  Circulator: Rozell Searing, RN Scrub Person: Adella Hare Float Surgical Tech: Rolan Bucco  Estimated Blood Loss: Minimal               Specimens: sent to path  Indications: This is a 52 year old female found to have an invasive right breast cancer as well as ductal carcinoma in situ in the upper outer quadrant of the right breast.  Preoperative genetic testing was negative.  Preoperative MRI showed no other areas of abnormality in either breast.  The decision was made to proceed to the operating room for a right breast partial mastectomy and sentinel node biopsy  Procedure: The patient was identified in the holding area.  She already had the radioactive seed in the right breast placed by radiology.  Radiation technologist's then injected radioactive isotope around the areola.  She was taken to the operating room.  She was placed supine on the operating room table and general anesthesia was induced.  Her right breast and axilla were prepped and draped in usual sterile fashion.  Using the neoprobe I identified the seed in the upper outer quadrant deep in the right breast.  I elected to make an incision in the patient's right axilla after anesthetized skin with Marcaine.  I then created a skin flap with the electrocautery and tunneled with the neoprobe toward the radioactive seed.  I then performed a right upper outer quadrant partial mastectomy with the aid of the neoprobe.  I stayed just underneath the skin it went all the way down to the chest wall and appeared to stay widely around the radioactive  seed with the neoprobe.  Once the specimen was completely removed I marked all margins with marker paint.  An x-ray of the specimen revealed that the suspicious area, radioactive seed, and previous marker were in the specimen.  This was sent to pathology for evaluation.  Through the same incision, I used the neoprobe to identify the right deep axillary sentinel lymph node.  I dissected deep into the axilla and identified the lymph node as well as one adjacent lymph node which I sent together to pathology for evaluation.  I then examined the nodal basin with the neoprobe and found no other increased uptake of radioactive isotope.  I examined the axilla and found no enlarged lymph nodes.  We then thoroughly irrigated the cavity with saline.  I anesthetized it further with Marcaine.  I placed surgical clips into the biopsy cavity of the breast.  Hemostasis appeared to be achieved.  I then closed the subtenons tissue with interrupted 3-0 Vicryl sutures and closed the skin with a running 4-0 Monocryl.  Dermabond and a breast binder was then applied.  The patient tolerated the procedure well.  All the counts were correct at the end of the procedure.  The patient was then extubated in the operating room and taken in a stable condition to the recovery room.          Anna Conrad A   Date: 03/18/2018  Time: 8:12 AM

## 2018-03-18 NOTE — Anesthesia Procedure Notes (Signed)
Procedure Name: LMA Insertion Date/Time: 03/18/2018 7:19 AM Performed by: Imagene Riches, CRNA Pre-anesthesia Checklist: Patient identified, Emergency Drugs available, Suction available and Patient being monitored Patient Re-evaluated:Patient Re-evaluated prior to induction Oxygen Delivery Method: Circle System Utilized Preoxygenation: Pre-oxygenation with 100% oxygen Induction Type: IV induction Ventilation: Mask ventilation without difficulty LMA: LMA inserted LMA Size: 4.0 Number of attempts: 1 Airway Equipment and Method: Bite block Placement Confirmation: positive ETCO2 Tube secured with: Tape Dental Injury: Teeth and Oropharynx as per pre-operative assessment

## 2018-03-18 NOTE — Anesthesia Procedure Notes (Signed)
Anesthesia Regional Block: Pectoralis block   Pre-Anesthetic Checklist: ,, timeout performed, Correct Patient, Correct Site, Correct Laterality, Correct Procedure, Correct Position, site marked, Risks and benefits discussed, pre-op evaluation,  At surgeon's request and post-op pain management  Laterality: Right  Prep: chloraprep       Needles:   Needle Type: Echogenic Needle     Needle Length: 9cm  Needle Gauge: 21     Additional Needles:   Procedures:,,,, ultrasound used (permanent image in chart),,,,  Narrative:  Start time: 03/18/2018 6:54 AM End time: 03/18/2018 7:02 AM Injection made incrementally with aspirations every 5 mL. Anesthesiologist: Lyndle Herrlich, MD

## 2018-03-18 NOTE — Discharge Instructions (Signed)
[facility] °Office Phone Number [phone] ° °BREAST BIOPSY/ PARTIAL MASTECTOMY: POST OP INSTRUCTIONS ° °Always review your discharge instruction sheet given to you by the facility where your surgery was performed. ° °IF YOU HAVE DISABILITY OR FAMILY LEAVE FORMS, YOU MUST BRING THEM TO THE OFFICE FOR PROCESSING.  DO NOT GIVE THEM TO YOUR DOCTOR. ° °1. A prescription for pain medication may be given to you upon discharge.  Take your pain medication as prescribed, if needed.  If narcotic pain medicine is not needed, then you may take acetaminophen (Tylenol) or ibuprofen (Advil) as needed. °2. Take your usually prescribed medications unless otherwise directed °3. If you need a refill on your pain medication, please contact your pharmacy.  They will contact our office to request authorization.  Prescriptions will not be filled after 5pm or on week-ends. °4. You should eat very light the first 24 hours after surgery, such as soup, crackers, pudding, etc.  Resume your normal diet the day after surgery. °5. Most patients will experience some swelling and bruising in the breast.  Ice packs and a good support bra will help.  Swelling and bruising can take several days to resolve.  °6. It is common to experience some constipation if taking pain medication after surgery.  Increasing fluid intake and taking a stool softener will usually help or prevent this problem from occurring.  A mild laxative (Milk of Magnesia or Miralax) should be taken according to package directions if there are no bowel movements after 48 hours. °7. Unless discharge instructions indicate otherwise, you may remove your bandages 24-48 hours after surgery, and you may shower at that time.  You may have steri-strips (small skin tapes) in place directly over the incision.  These strips should be left on the skin for 7-10 days.  If your surgeon used skin glue on the incision, you may shower in 24 hours.  The glue will flake off over the  next 2-3 weeks.  Any sutures or staples will be removed at the office during your follow-up visit. °8. ACTIVITIES:  You may resume regular daily activities (gradually increasing) beginning the next day.  Wearing a good support bra or sports bra minimizes pain and swelling.  You may have sexual intercourse when it is comfortable. °a. You may drive when you no longer are taking prescription pain medication, you can comfortably wear a seatbelt, and you can safely maneuver your car and apply brakes. °b. RETURN TO WORK:  ______________________________________________________________________________________ °9. You should see your doctor in the office for a follow-up appointment approximately two weeks after your surgery.  Your doctor’s nurse will typically make your follow-up appointment when she calls you with your pathology report.  Expect your pathology report 2-3 business days after your surgery.  You may call to check if you do not hear from us after three days. °10. OTHER INSTRUCTIONS: ___OK TO SHOWER STARTING TOMORROW °11. ____________________________________________________________________________________________ _____________________________________________________________________________________________________________________________________ °_____________________________________________________________________________________________________________________________________ °_____________________________________________________________________________________________________________________________________ ° °WHEN TO CALL YOUR DOCTOR: °1. Fever over 101.0 °2. Nausea and/or vomiting. °3. Extreme swelling or bruising. °4. Continued bleeding from incision. °5. Increased pain, redness, or drainage from the incision. ° °The clinic staff is available to answer your questions during regular business hours.  Please don’t hesitate to call and ask to speak to one of the nurses for clinical concerns.  If you have a  medical emergency, go to the nearest emergency room or call 911.  A surgeon from [facility] is always on call at the hospital. ° °For   further questions, please visit [email or web address]  °

## 2018-03-18 NOTE — Transfer of Care (Signed)
Immediate Anesthesia Transfer of Care Note  Patient: Anna Conrad  Procedure(s) Performed: RIGHT BREAST PARTIAL MASTECTOMY WITH RADIOACTIVE SEED AND SENTINEL LYMPH NODE BIOPSY (Right Breast)  Patient Location: PACU  Anesthesia Type:GA combined with regional for post-op pain  Level of Consciousness: awake and alert   Airway & Oxygen Therapy: Patient Spontanous Breathing and Patient connected to nasal cannula oxygen  Post-op Assessment: Report given to RN and Post -op Vital signs reviewed and stable  Post vital signs: Reviewed and stable  Last Vitals:  Vitals Value Taken Time  BP 107/70 03/18/2018  8:23 AM  Temp    Pulse 86 03/18/2018  8:23 AM  Resp 18 03/18/2018  8:23 AM  SpO2 99 % 03/18/2018  8:23 AM  Vitals shown include unvalidated device data.  Last Pain:  Vitals:   03/18/18 0601  TempSrc: Oral      Patients Stated Pain Goal: 4 (34/19/62 2297)  Complications: No apparent anesthesia complications

## 2018-03-18 NOTE — Interval H&P Note (Signed)
History and Physical Interval Note: no change in H and P  03/18/2018 7:04 AM  Anna Conrad  has presented today for surgery, with the diagnosis of RIGHT BREAST CANCER  The various methods of treatment have been discussed with the patient and family. After consideration of risks, benefits and other options for treatment, the patient has consented to  Procedure(s): RIGHT BREAST PARTIAL MASTECTOMY WITH RADIOACTIVE SEED AND SENTINEL LYMPH NODE BIOPSY (Right) as a surgical intervention .  The patient's history has been reviewed, patient examined, no change in status, stable for surgery.  I have reviewed the patient's chart and labs.  Questions were answered to the patient's satisfaction.     Abdelaziz Westenberger A

## 2018-03-19 ENCOUNTER — Encounter (HOSPITAL_COMMUNITY): Payer: Self-pay | Admitting: Surgery

## 2018-03-20 NOTE — Anesthesia Postprocedure Evaluation (Signed)
Anesthesia Post Note  Patient: Anna Conrad  Procedure(s) Performed: RIGHT BREAST PARTIAL MASTECTOMY WITH RADIOACTIVE SEED AND SENTINEL LYMPH NODE BIOPSY (Right Breast)     Patient location during evaluation: PACU Anesthesia Type: General Level of consciousness: awake and alert Pain management: pain level controlled Vital Signs Assessment: post-procedure vital signs reviewed and stable Respiratory status: spontaneous breathing, nonlabored ventilation, respiratory function stable and patient connected to nasal cannula oxygen Cardiovascular status: blood pressure returned to baseline and stable Postop Assessment: no apparent nausea or vomiting Anesthetic complications: no    Last Vitals:  Vitals:   03/18/18 0853 03/18/18 0856  BP: 105/79 103/73  Pulse: 77 70  Resp: 15 15  Temp:  36.7 C  SpO2: 98% 99%    Last Pain:  Vitals:   03/18/18 0853  TempSrc:   PainSc: 0-No pain                 Denorris Reust EDWARD

## 2018-03-21 ENCOUNTER — Telehealth: Payer: Self-pay | Admitting: *Deleted

## 2018-03-21 NOTE — Telephone Encounter (Signed)
Received order for mammaprint testing. Requisition faxed to pathology and [facility]. Received by [name].  

## 2018-03-29 ENCOUNTER — Encounter: Payer: Self-pay | Admitting: *Deleted

## 2018-03-29 ENCOUNTER — Encounter (HOSPITAL_COMMUNITY): Payer: Self-pay

## 2018-03-29 ENCOUNTER — Telehealth: Payer: Self-pay | Admitting: *Deleted

## 2018-03-29 NOTE — Telephone Encounter (Signed)
Received mammaprint results of high risk.   

## 2018-04-01 ENCOUNTER — Other Ambulatory Visit: Payer: Self-pay | Admitting: Oncology

## 2018-04-02 ENCOUNTER — Other Ambulatory Visit: Payer: Self-pay | Admitting: Surgery

## 2018-04-04 NOTE — Progress Notes (Signed)
[facility]  [phone] [phone]     ID: Anna Conrad DOB: 1965-12-15  MR#: [id number]  OEU#:[id number]  Patient Care Team: Filiberto Pinks as PCP - General (Physician Assistant) Fantasia Jinkins, Virgie Dad, MD as Consulting Physician (Oncology) Kyung Rudd, MD as Consulting Physician (Radiation Oncology) [name], MD as Consulting Physician (General Surgery) Janyth Pupa, DO as Consulting Physician (Obstetrics and Gynecology) Janyth Pupa, DO as Consulting Physician (Obstetrics and Gynecology) OTHER MD:  CHIEF COMPLAINT: Estrogen receptor positive breast cancer  CURRENT TREATMENT:    HISTORY OF CURRENT ILLNESS: From the original intake note:  Anna Conrad had routine screening mammography on 01/29/2018 showing a possible mass with associated calcifications in the right breast. She underwent unilateral right diagnostic mammography with tomography and right breast ultrasonography at [facility] on 02/05/2018 showing a highly suspicious mass over the 10:30 position upper outer of the right breast located 6 cm from the nipple measuring 1.3 x 1.4 x 1.5 cm. Associated microcalcifications corresponding to the mammographic abnormality. Ultrasound of the right axilla was normal.  Accordingly on 02/29/2019 she proceeded to biopsy of the right breast area in question. 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 Prognostic indicators significant for: estrogen receptor, 100% positive with strong staining intensity and progesterone receptor, 20% positive iwht moderate staining intensity. Proliferation marker Ki67 at 10%. HER2 not amplified with ratios HER2/CEP17 signals 1.17 and average HER2 copies per cell 2.45.  On 02/21/2018 the patient had bilateral breast MRI.  This measured the upper outer quadrant right breast mass at 2.3 cm including an area of  surrounding non-masslike enhancement.  There was no evidence of multifocal or multicentric disease, no lymphadenopathy, and no findings in the left breast.  The patient's subsequent history is as detailed below.  INTERVAL HISTORY: Anna Conrad returns today for follow up and treatment of her estrogen receptor positive breast cancer accompanied by her husband. Since her last visit, she underwent right lumpectomy and sentinel lymph node sampling on 03/18/2018 showing: invasive ductal carcinoma grade II spanning 1.5 cm. Negative margins. Fibrocystic changes with calcifications. One benign lymph node (0/1).   She also completed a mammaprint on 03/18/2018 which showed "high risk".  She is here today to discuss those results  REVIEW OF SYSTEMS: Anna Conrad reports that she has had no fever or bleeding after her surgery. She still has some breast and slight right arm swelling, but she denies redness. She was given naproxen for inflammation. She was off from working this week, but she is preparing to return to work. She denies unusual headaches, visual changes, nausea, vomiting, or dizziness. There has been no unusual cough, phlegm production, or pleurisy. This been no change in bowel or bladder habits. She denies unexplained fatigue or unexplained weight loss, bleeding, rash, or fever. A detailed review of systems was otherwise stable.   PAST MEDICAL HISTORY: Past Medical History:  Diagnosis Date  . Blood dyscrasia    hx blood clott superficial in rt arm  . Family history of breast cancer   . Heart murmur   . Hypertension   She used to have migraines. She denies GERD, asthma, emphysema. She was told that she had a slight heart murmur. She denies heart palpitations.   PAST SURGICAL HISTORY: Past Surgical History:  Procedure Laterality Date  . BREAST LUMPECTOMY WITH RADIOACTIVE SEED AND SENTINEL LYMPH NODE BIOPSY Right 03/18/2018   Procedure: RIGHT BREAST PARTIAL MASTECTOMY WITH RADIOACTIVE SEED AND SENTINEL  LYMPH  NODE BIOPSY;  Surgeon: [name], MD;  Location: [name];  Service: General;  Laterality: Right;  . cesearean section     tubal ligation   . TUBAL LIGATION      2 Caesarian sections- tubal ligation at the second c-section.  FAMILY HISTORY Family History  Problem Relation Age of Onset  . Breast cancer Sister 108       approximate  . Cancer Father 89       started in shoulder, spread to spine, brain, lung  . Cancer Paternal [name]        'blood cancer'  . Heart attack Paternal Uncle   . Breast cancer Cousin        age dx unk  . Breast cancer Cousin        age dx unk  . Breast cancer Cousin        age dx unk   The patient's father passed away in the summer 2018 at the age of 83 due to metastatic lung cancer. He was a previous smoker. The patient's mother is alive at age 53. The patient has 1 brother and 1 sister. The patient's sister was diagnosed with breast cancer at the age of 86.  The patient has 3 paternal cousins diagnosed with breast cancer. She denies a family history of ovarian cancer.    GYNECOLOGIC HISTORY:  The patient's periods currently are about twice a month.   Menarche: 52 years old. She used to be on a dance team.  Age at first live birth: 52 years old She is [place] P2. She was taking oral contraceptives, but this has been discontinued as of January 2019. She had bilateral tubal ligation at the time of her second c-section.    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  She has a PhD.  Her husband, Anna Conrad, is a Chief Strategy Officer for [employer], making metal parts. He travels about 2-3 times per year. Their sons are [name] age 38, and [name] age 31. She attends [employer].     ADVANCED DIRECTIVES:    HEALTH MAINTENANCE: Social History   Tobacco Use  . Smoking status: Never Smoker  . Smokeless tobacco: Never Used  Substance Use Topics  . Alcohol use: Yes    Comment: occ  . Drug use: Never      Colonoscopy:n/a  PAP: 2017/ normal  Bone density: n/a   Allergies  Allergen Reactions  . Latex Rash    Red  rash    Current Outpatient Medications  Medication Sig Dispense Refill  . acetaminophen (TYLENOL) 500 MG tablet Take 1,000 mg by mouth every 6 (six) hours as needed (for pain.).    Marland Kitchen ibuprofen (ADVIL,MOTRIN) 200 MG tablet Take 400 mg by mouth every 8 (eight) hours as needed (for pain.).    Marland Kitchen lisinopril-hydrochlorothiazide (PRINZIDE,ZESTORETIC) 10-12.5 MG tablet Take 1 tablet by mouth daily.    . Multiple Vitamins-Minerals (MULTIVITAMIN WITH MINERALS) tablet Take 1 tablet by mouth daily.    Marland Kitchen oxyCODONE (OXY IR/ROXICODONE) 5 MG immediate release tablet Take 1-2 tablets (5-10 mg total) by mouth every 6 (six) hours as needed for moderate pain, severe pain or breakthrough pain. 20 tablet 0   No current facility-administered medications for this visit.     OBJECTIVE: Middle-aged white woman who was tearful during today's visit  Vitals:   04/05/18 1420  BP: 111/77  Pulse: 71  Resp: 19  Temp: 98.5 F (36.9 C)  SpO2: 100%     Body mass index is 32.56 kg/m.   Wt Readings from Last 3 Encounters:  04/05/18 189 lb 11.2 oz (86 kg)  03/12/18 185 lb 6.5 oz (84.1 kg)  03/05/18 189 lb 12.8 oz (86.1 kg)      ECOG FS:1 - Symptomatic but completely ambulatory  Sclerae unicteric, EOMs intact Oropharynx clear and moist No cervical or supraclavicular adenopathy Lungs no rales or rhonchi Heart regular rate and rhythm Abd soft, nontender, positive bowel sounds MSK no focal spinal tenderness, no upper extremity lymphedema Neuro: nonfocal, well oriented, appropriate affect Breasts: The right breast is status post recent lumpectomy.  The cosmetic result is excellent.  There is no erythema, dehiscence, or swelling.  The left breast is benign.  Both axillae are benign.  LAB RESULTS:  CMP     Component Value Date/Time   NA 137 03/12/2018 1530   K 3.4 (L) 03/12/2018 1530   CL  101 03/12/2018 1530   CO2 27 03/12/2018 1530   GLUCOSE 89 03/12/2018 1530   BUN 13 03/12/2018 1530   CREATININE 0.98 03/12/2018 1530   CALCIUM 9.2 03/12/2018 1530   GFRNONAA >60 03/12/2018 1530   GFRAA >60 03/12/2018 1530    No results found for: TOTALPROTELP, ALBUMINELP, A1GS, A2GS, BETS, BETA2SER, GAMS, MSPIKE, SPEI  No results found for: KPAFRELGTCHN, LAMBDASER, KAPLAMBRATIO  Lab Results  Component Value Date   WBC 7.3 03/12/2018   HGB 13.6 03/12/2018   HCT 41.8 03/12/2018   MCV 94.6 03/12/2018   PLT 306 03/12/2018    _0 @  No results found for: LABCA2  No components found for: [id number]  No results for input(s): INR in the last 168 hours.  No results found for: LABCA2  No results found for: IPJ825  No results found for: KNL976  No results found for: BHA193  No results found for: CA2729  No components found for: HGQUANT  No results found for: CEA1 / No results found for: CEA1   No results found for: AFPTUMOR  No results found for: CHROMOGRNA  No results found for: PSA1  No visits with results within 3 Day(s) from this visit.  Latest known visit with results is:  Admission on 03/18/2018, Discharged on 03/18/2018  Component Date Value Ref Range Status  . Preg Test, Ur 03/18/2018 NEGATIVE  NEGATIVE Final   Comment:        THE SENSITIVITY OF THIS METHODOLOGY IS >24 mIU/mL     (this displays the last labs from the last 3 days)  No results found for: TOTALPROTELP, ALBUMINELP, A1GS, A2GS, BETS, BETA2SER, GAMS, MSPIKE, SPEI (this displays SPEP labs)  No results found for: KPAFRELGTCHN, LAMBDASER, KAPLAMBRATIO (kappa/lambda light chains)  No results found for: HGBA, HGBA2QUANT, HGBFQUANT, HGBSQUAN (Hemoglobinopathy evaluation)   No results found for: LDH  No results found for: IRON, TIBC, IRONPCTSAT (Iron and TIBC)  No results found for: FERRITIN  Urinalysis No results found for: COLORURINE, APPEARANCEUR, LABSPEC, PHURINE, GLUCOSEU,  HGBUR, BILIRUBINUR, KETONESUR, PROTEINUR, UROBILINOGEN, NITRITE, LEUKOCYTESUR   STUDIES: Nm Sentinel Node Inj-no Rpt (breast)  Result Date: 03/18/2018 Sulfur colloid was injected by the nuclear medicine technologist for melanoma sentinel node.   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 EXAM: SPECIMEN RADIOGRAPH OF THE RIGHT BREAST COMPARISON:  03/15/2018 FINDINGS: Status post excision of the RIGHT breast. The radioactive seed and biopsy marker clip are present, completely intact, and were  marked for pathology. IMPRESSION: Specimen radiograph of the right breast. Electronically Signed   By: Nolon Nations M.D.   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 Radioactive seed localization is performed in anticipation of RIGHT breast lumpectomy on 03/18/2018. EXAM: MAMMOGRAPHIC GUIDED RADIOACTIVE SEED LOCALIZATION OF THE RIGHT BREAST COMPARISON:  Previous exam(s). FINDINGS: Patient presents for radioactive seed localization prior to RIGHT breast lumpectomy. I met with the patient and we discussed the procedure of seed localization including benefits and alternatives. We discussed the high likelihood of a successful procedure. We discussed the risks of the procedure including infection, bleeding, tissue injury and further surgery. We discussed the low dose of radioactivity involved in the procedure. Informed, written consent was given. The usual time-out protocol was performed immediately prior to the procedure. Using mammographic guidance, sterile technique with chlorhexidine as skin antisepsis, 1% lidocaine as local anesthetic, an I-125 radioactive seed was used to localize the ribbon shaped tissue marker clip  associated with the mass, architectural distortion and microcalcifications in the UPPER OUTER quadrant of the RIGHT breast using a SUPERIOR approach. The follow-up mammogram images confirm appropriate positioning of the seed within the mass and adjacent to the ribbon clip and calcifications. The images were marked for Dr. Ninfa Linden. Follow-up survey of the patient confirms presence of the radioactive seed. Order number of I-125 seed: [id number] Total Activity: 0.248 mCi Reference Date: 03/07/2018 The patient tolerated the procedure well and was released from the [facility]. She was given instructions regarding seed removal. IMPRESSION: Radioactive seed localization of the RIGHT breast. No apparent complications. Electronically Signed   By: Evangeline Dakin M.D.   On: 03/15/2018 16:33    ELIGIBLE FOR AVAILABLE RESEARCH PROTOCOL: no  ASSESSMENT: 52 y.o. [place], [place] woman status post right breast upper outer quadrant biopsy 02/07/2018 for a clinical T1c-T2 N0, stage IA-B invasive ductal carcinoma, grade 2, estrogen receptor and progesterone receptor positive, HER-2 not amplified, with an MIB-1 of 10%  (1) status post right lumpectomy 03/18/2018 for a pT1c pN0, stage IA invasive ductal carcinoma, grade 2, estrogen and progesterone receptor positive, with negative margins.  A total of 1 lymph node was removed  (2) Mammaprint on 03/18/2018 showed high risk, indicating that with chemotherapy and hormone therapy, the patient would have a nearly 95% chance of having no distant metastases within the next 5 years  (3) chemotherapy will consist of doxorubicin and cyclophosphamide in dose dense fashion x4 followed by weekly paclitaxel x12  (4) adjuvant radiation to follow  (5) antiestrogens to follow at the completion of local treatment  (6) genetics testing 03/13/2018 through the Common Hereditary Cancer Panel offered by [facility] if[facility] no deleterious mutations in APC, ATM, AXIN2, BARD1, BMPR1A, BRCA1, BRCA2,  BRIP1, CDH1, CDKN2A (p14ARF), CDKN2A (p16INK4a), CKD4, CHEK2, CTNNA1, DICER1, EPCAM (Deletion/duplication testing only), GREM1 (promoter region deletion/duplication testing only), KIT, MEN1, MLH1, MSH2, MSH3, MSH6, MUTYH, NBN, NF1, NHTL1, PALB2, PDGFRA, PMS2, POLD1, POLE, PTEN, RAD50, RAD51C, RAD51D, SDHB, SDHC, SDHD, SMAD4, SMARCA4. STK11, TP53, TSC1, TSC2, and VHL.  The following genes were evaluated for sequence changes only: SDHA and HOXB13 c.251G>A variant only.  (a)  A variant of uncertain significance (VUS) in the gene MSH3 was also identified c.1027+4T>C ([employer]).    PLAN: I spent approximately 45 minutes today with Anna Conrad and Anna Conrad going over her results.  She has a  good understanding of the past involved, which is generally favorable.  We then discussed the Mammaprint results which shows this small node-negative tumor to be dangerous.  If she wants to have the best prognosis she will need chemotherapy.  What the Mammaprint tells [place] is that if she receives both chemotherapy and antiestrogens she has a nearly 95% chance of not having recurrent disease outside the breast within the next 5 years.  She understands that does not mean that she will have disease after 5 years only that that is as far as a data goes right now.  She also understands that the risk of local recurrence will also be very low but it is a separate risk, related to her radiation treatments as well as the antiestrogen she will eventually take  We then discussed the development of standard of care and specifically the chemo she will received which is cyclophosphamide and doxorubicin in dose dense fashion x4 followed by weekly paclitaxel x12.  We discussed the possible side effects toxicities and complications.  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  She will need an echo and a port.  Anna Conrad understands all this and she understands that the sooner we start treatment the more effective the treatment may  be.  However she really does not want to do anything until after June 11 which is her last day of school.  After much shuffling of calendar dates, we determined that if she starts on June 25 she will be done with a difficult part of the chemo, namely the for CA cycles, before school starts.  She can then receive the Taxol portion of her treatment during the school year, missing a day a week for the first 3 months.  She is willing to do that.  She will return to see me before the start of chemo to discuss how to take her supportive medications.  He knows to call for any other problems that may develop before her next visit.   Alexxus Sobh, Virgie Dad, MD  04/05/18 2:46 PM Medical Oncology and Hematology [facility] [address] [place], [place] [address] Tel. [phone]    Fax. [phone]  This document serves as a record of services personally performed by Lurline Del, MD. It was created on his behalf by Sheron Nightingale, a trained medical scribe. The creation of this record is based on the scribe's personal observations and the provider's statements to them.   I have reviewed the above documentation for accuracy and completeness, and I agree with the above.

## 2018-04-05 ENCOUNTER — Inpatient Hospital Stay: Payer: BC Managed Care – PPO | Attending: Oncology | Admitting: Oncology

## 2018-04-05 ENCOUNTER — Telehealth: Payer: Self-pay | Admitting: Oncology

## 2018-04-05 VITALS — BP 111/77 | HR 71 | Temp 98.5°F | Resp 19 | Ht 64.0 in | Wt 189.7 lb

## 2018-04-05 DIAGNOSIS — Z170 Estrogen receptor positive status [ER+]: Secondary | ICD-10-CM

## 2018-04-05 DIAGNOSIS — C50411 Malignant neoplasm of upper-outer quadrant of right female breast: Secondary | ICD-10-CM | POA: Diagnosis not present

## 2018-04-05 DIAGNOSIS — Z1379 Encounter for other screening for genetic and chromosomal anomalies: Secondary | ICD-10-CM

## 2018-04-05 NOTE — Telephone Encounter (Signed)
Scheduled appt per 4/26 los - Gave patient AVS and calender per los. Orlando Penner scheduled ECHO

## 2018-04-05 NOTE — Progress Notes (Signed)
START ON PATHWAY REGIMEN - Breast   Dose-Dense AC q14 days:   A cycle is every 14 days:     Doxorubicin      Cyclophosphamide      Pegfilgrastim-xxxx   **Always confirm dose/schedule in your pharmacy ordering system**    Paclitaxel 80 mg/m2 Weekly:   Administer weekly:     Paclitaxel   **Always confirm dose/schedule in your pharmacy ordering system**    Patient Characteristics: Postoperative without Neoadjuvant Therapy (Pathologic Staging), Invasive Disease, Adjuvant Therapy, HER2 Negative/Unknown/Equivocal, ER Positive, Node Negative, pT1a-c, pN0/N23m or pT2 or Higher, pN0, MammaPrint(R), High Genomic Risk Therapeutic Status: Postoperative without Neoadjuvant Therapy (Pathologic Staging) AJCC Grade: G2 AJCC N Category: pN0 AJCC M Category: cM0 ER Status: Positive (+) AJCC 8 Stage Grouping: IA HER2 Status: Negative (-) Oncotype Dx Recurrence Score: Ordered Other Genomic Test AJCC T Category: pT1c PR Status: Positive (+) Has this patient completed genomic testing<= Yes - MammaPrint(R) MammaPrint(R) Score: High Genomic Risk Intent of Therapy: Curative Intent, Discussed with Patient

## 2018-04-08 ENCOUNTER — Other Ambulatory Visit: Payer: Self-pay | Admitting: Surgery

## 2018-05-01 ENCOUNTER — Other Ambulatory Visit: Payer: Self-pay | Admitting: Surgery

## 2018-05-20 ENCOUNTER — Encounter (HOSPITAL_BASED_OUTPATIENT_CLINIC_OR_DEPARTMENT_OTHER): Payer: Self-pay | Admitting: *Deleted

## 2018-05-20 ENCOUNTER — Other Ambulatory Visit: Payer: Self-pay

## 2018-05-22 ENCOUNTER — Other Ambulatory Visit (HOSPITAL_COMMUNITY): Payer: BC Managed Care – PPO

## 2018-05-23 ENCOUNTER — Ambulatory Visit (HOSPITAL_COMMUNITY)
Admission: RE | Admit: 2018-05-23 | Discharge: 2018-05-23 | Disposition: A | Discharge status: Home / Self Care | Payer: BC Managed Care – PPO | Source: Ambulatory Visit | Attending: Oncology | Admitting: Oncology

## 2018-05-23 ENCOUNTER — Other Ambulatory Visit (HOSPITAL_COMMUNITY): Payer: BC Managed Care – PPO

## 2018-05-23 ENCOUNTER — Inpatient Hospital Stay: Payer: BC Managed Care – PPO | Attending: Oncology

## 2018-05-23 ENCOUNTER — Encounter (HOSPITAL_BASED_OUTPATIENT_CLINIC_OR_DEPARTMENT_OTHER)
Admission: RE | Admit: 2018-05-23 | Discharge: 2018-05-23 | Disposition: A | Discharge status: Home / Self Care | Payer: BC Managed Care – PPO | Source: Ambulatory Visit | Attending: Surgery | Admitting: Surgery

## 2018-05-23 DIAGNOSIS — Z170 Estrogen receptor positive status [ER+]: Secondary | ICD-10-CM | POA: Insufficient documentation

## 2018-05-23 DIAGNOSIS — I5189 Other ill-defined heart diseases: Secondary | ICD-10-CM | POA: Diagnosis not present

## 2018-05-23 DIAGNOSIS — C50411 Malignant neoplasm of upper-outer quadrant of right female breast: Secondary | ICD-10-CM | POA: Insufficient documentation

## 2018-05-23 DIAGNOSIS — Z5189 Encounter for other specified aftercare: Secondary | ICD-10-CM | POA: Insufficient documentation

## 2018-05-23 DIAGNOSIS — Z5111 Encounter for antineoplastic chemotherapy: Secondary | ICD-10-CM | POA: Insufficient documentation

## 2018-05-23 LAB — BASIC METABOLIC PANEL
ANION GAP: 10 (ref 5–15)
BUN: 16 mg/dL (ref 6–20)
CHLORIDE: 102 mmol/L (ref 101–111)
CO2: 27 mmol/L (ref 22–32)
CREATININE: 1.06 mg/dL — AB (ref 0.44–1.00)
Calcium: 9.4 mg/dL (ref 8.9–10.3)
GFR calc non Af Amer: >60 mL/min (ref >60)
Glucose, Bld: 101 mg/dL — ABNORMAL HIGH (ref 65–99)
POTASSIUM: 3.8 mmol/L (ref 3.5–5.1)
SODIUM: 139 mmol/L (ref 135–145)

## 2018-05-23 NOTE — Progress Notes (Signed)
  Echocardiogram 2D Echocardiogram has been performed.  Anna Conrad F 05/23/2018, 2:17 PM

## 2018-05-26 NOTE — H&P (Signed)
Anna Conrad is an 52 y.o. female.   Chief Complaint: right breast cancer HPI: She is doing well after surgery for her right breast cancer.  Port a cath insertion has been recommended for chemotherapy given her risks of recurrence based on oncotype.  She is currently doing well  Past Medical History:  Diagnosis Date  . Blood dyscrasia    hx blood clott superficial in rt arm  . Cancer [facility]) 2019   right breast cancer  . Family history of breast cancer   . Heart murmur   . Hypertension     Past Surgical History:  Procedure Laterality Date  . BREAST LUMPECTOMY WITH RADIOACTIVE SEED AND SENTINEL LYMPH NODE BIOPSY Right 03/18/2018   Procedure: RIGHT BREAST PARTIAL MASTECTOMY WITH RADIOACTIVE SEED AND SENTINEL LYMPH NODE BIOPSY;  Surgeon: [name], MD;  Location: [place];  Service: General;  Laterality: Right;  . cesearean section     tubal ligation   . TUBAL LIGATION      Family History  Problem Relation Age of Onset  . Breast cancer Sister 27       approximate  . Cancer Father 45       started in shoulder, spread to spine, brain, lung  . Cancer Paternal [name]        'blood cancer'  . Heart attack Paternal Uncle   . Breast cancer Cousin        age dx unk  . Breast cancer Cousin        age dx unk  . Breast cancer Cousin        age dx unk   Social History:  reports that she has never smoked. She has never used smokeless tobacco. She reports that she drinks alcohol. She reports that she does not use drugs.  Allergies:  Allergies  Allergen Reactions  . Latex Rash    Red  rash    No medications prior to admission.    No results found for this or any previous visit (from the past 48 hour(s)). No results found.  Review of Systems  All other systems reviewed and are negative.   Height 5\' 4"  (1.626 m), weight 85.3 kg (188 lb), last menstrual period 04/11/2018. Physical Exam  Constitutional: She is oriented to person, place, and time. She appears  well-developed and well-nourished.  HENT:  Head: Normocephalic and atraumatic.  Eyes: Pupils are equal, round, and reactive to light.  Neck: Normal range of motion. Neck supple. No tracheal deviation present.  Cardiovascular: Normal rate, regular rhythm and normal heart sounds.  Respiratory: Effort normal and breath sounds normal. No respiratory distress.  GI: Soft. There is no tenderness.  Musculoskeletal: Normal range of motion.  Neurological: She is alert and oriented to person, place, and time.  Skin: Skin is warm and dry. No erythema.  Psychiatric: Her behavior is normal. Judgment normal.     Assessment/Plan Right breast cancer  We will now proceed with port-a-cath insertion for planned IV chemotherapy.  We again discussed the risks which include but are not limited to bleeding, infection, injury to surrounding structures, pneumothorax, port malfunction, cardiopulmonary issues, etc.  She agrees to proceed.  Harl Bowie, MD 05/26/2018, 8:00 PM

## 2018-05-27 ENCOUNTER — Ambulatory Visit (HOSPITAL_COMMUNITY): Payer: BC Managed Care – PPO

## 2018-05-27 ENCOUNTER — Ambulatory Visit (HOSPITAL_BASED_OUTPATIENT_CLINIC_OR_DEPARTMENT_OTHER): Payer: BC Managed Care – PPO | Admitting: Registered Nurse

## 2018-05-27 ENCOUNTER — Encounter (HOSPITAL_BASED_OUTPATIENT_CLINIC_OR_DEPARTMENT_OTHER): Payer: Self-pay | Admitting: Registered Nurse

## 2018-05-27 ENCOUNTER — Encounter (HOSPITAL_BASED_OUTPATIENT_CLINIC_OR_DEPARTMENT_OTHER)
Admission: RE | Disposition: A | Discharge status: Home / Self Care | Payer: Self-pay | Source: Ambulatory Visit | Attending: Surgery

## 2018-05-27 ENCOUNTER — Ambulatory Visit (HOSPITAL_BASED_OUTPATIENT_CLINIC_OR_DEPARTMENT_OTHER)
Admission: RE | Admit: 2018-05-27 | Discharge: 2018-05-27 | Disposition: A | Discharge status: Home / Self Care | Payer: BC Managed Care – PPO | Source: Ambulatory Visit | Attending: Surgery | Admitting: Surgery

## 2018-05-27 DIAGNOSIS — Z95828 Presence of other vascular implants and grafts: Secondary | ICD-10-CM

## 2018-05-27 DIAGNOSIS — I10 Essential (primary) hypertension: Secondary | ICD-10-CM | POA: Diagnosis not present

## 2018-05-27 DIAGNOSIS — C50911 Malignant neoplasm of unspecified site of right female breast: Secondary | ICD-10-CM | POA: Diagnosis present

## 2018-05-27 DIAGNOSIS — Z803 Family history of malignant neoplasm of breast: Secondary | ICD-10-CM | POA: Diagnosis not present

## 2018-05-27 DIAGNOSIS — Z91040 Latex allergy status: Secondary | ICD-10-CM | POA: Diagnosis not present

## 2018-05-27 DIAGNOSIS — C50919 Malignant neoplasm of unspecified site of unspecified female breast: Secondary | ICD-10-CM

## 2018-05-27 DIAGNOSIS — R52 Pain, unspecified: Secondary | ICD-10-CM

## 2018-05-27 HISTORY — PX: PORTACATH PLACEMENT: SHX2246

## 2018-05-27 HISTORY — DX: Malignant (primary) neoplasm, unspecified: C80.1

## 2018-05-27 LAB — POCT PREGNANCY, URINE: Preg Test, Ur: NEGATIVE

## 2018-05-27 SURGERY — INSERTION, TUNNELED CENTRAL VENOUS DEVICE, WITH PORT
Anesthesia: General | Site: Chest | Laterality: Left

## 2018-05-27 MED ORDER — CEFAZOLIN SODIUM-DEXTROSE 2-4 GM/100ML-% IV SOLN
INTRAVENOUS | Status: AC
  Filled 2018-05-27: qty 100

## 2018-05-27 MED ORDER — BUPIVACAINE-EPINEPHRINE 0.5% -1:200000 IJ SOLN
INTRAMUSCULAR | Status: DC | PRN
  Administered 2018-05-27: 7 mL

## 2018-05-27 MED ORDER — CEFAZOLIN SODIUM-DEXTROSE 2-4 GM/100ML-% IV SOLN
2.0000 g | INTRAVENOUS | Status: AC
  Administered 2018-05-27: 2 g via INTRAVENOUS

## 2018-05-27 MED ORDER — MIDAZOLAM HCL 2 MG/2ML IJ SOLN
INTRAMUSCULAR | Status: AC
  Filled 2018-05-27: qty 2

## 2018-05-27 MED ORDER — BUPIVACAINE-EPINEPHRINE (PF) 0.5% -1:200000 IJ SOLN
INTRAMUSCULAR | Status: AC
  Filled 2018-05-27: qty 60

## 2018-05-27 MED ORDER — CHLORHEXIDINE GLUCONATE CLOTH 2 % EX PADS: 6.0000 | MEDICATED_PAD | Freq: Once | CUTANEOUS | Status: DC

## 2018-05-27 MED ORDER — LIDOCAINE HCL (CARDIAC) PF 100 MG/5ML IV SOSY
PREFILLED_SYRINGE | INTRAVENOUS | Status: AC
  Filled 2018-05-27: qty 5

## 2018-05-27 MED ORDER — DEXAMETHASONE SODIUM PHOSPHATE 10 MG/ML IJ SOLN
INTRAMUSCULAR | Status: DC | PRN
  Administered 2018-05-27: 10 mg via INTRAVENOUS

## 2018-05-27 MED ORDER — HEPARIN (PORCINE) IN NACL 2-0.9 UNITS/ML
INTRAMUSCULAR | Status: AC | PRN
  Administered 2018-05-27: 5 mL via INTRAVENOUS

## 2018-05-27 MED ORDER — LIDOCAINE HCL (PF) 1 % IJ SOLN
INTRAMUSCULAR | Status: AC
  Filled 2018-05-27: qty 30

## 2018-05-27 MED ORDER — FENTANYL CITRATE (PF) 100 MCG/2ML IJ SOLN
INTRAMUSCULAR | Status: AC
  Filled 2018-05-27: qty 2

## 2018-05-27 MED ORDER — MIDAZOLAM HCL 2 MG/2ML IJ SOLN: 1.0000 mg | INTRAMUSCULAR | Status: DC | PRN

## 2018-05-27 MED ORDER — LIDOCAINE 2% (20 MG/ML) 5 ML SYRINGE
INTRAMUSCULAR | Status: DC | PRN
  Administered 2018-05-27: 60 mg via INTRAVENOUS

## 2018-05-27 MED ORDER — HEPARIN SOD (PORK) LOCK FLUSH 100 UNIT/ML IV SOLN
INTRAVENOUS | Status: DC | PRN
  Administered 2018-05-27: 500 [IU] via INTRAVENOUS

## 2018-05-27 MED ORDER — DEXAMETHASONE SODIUM PHOSPHATE 10 MG/ML IJ SOLN
INTRAMUSCULAR | Status: AC
  Filled 2018-05-27: qty 1

## 2018-05-27 MED ORDER — PROPOFOL 10 MG/ML IV BOLUS
INTRAVENOUS | Status: DC | PRN
  Administered 2018-05-27: 200 mg via INTRAVENOUS

## 2018-05-27 MED ORDER — HEPARIN SOD (PORK) LOCK FLUSH 100 UNIT/ML IV SOLN
INTRAVENOUS | Status: AC
  Filled 2018-05-27: qty 5

## 2018-05-27 MED ORDER — SCOPOLAMINE 1 MG/3DAYS TD PT72: 1.0000 | MEDICATED_PATCH | Freq: Once | TRANSDERMAL | Status: DC | PRN

## 2018-05-27 MED ORDER — FENTANYL CITRATE (PF) 100 MCG/2ML IJ SOLN
INTRAMUSCULAR | Status: DC | PRN
  Administered 2018-05-27 (×2): 50 ug via INTRAVENOUS

## 2018-05-27 MED ORDER — FENTANYL CITRATE (PF) 100 MCG/2ML IJ SOLN: 50.0000 ug | INTRAMUSCULAR | Status: DC | PRN

## 2018-05-27 MED ORDER — PROPOFOL 10 MG/ML IV BOLUS
INTRAVENOUS | Status: AC
  Filled 2018-05-27: qty 20

## 2018-05-27 MED ORDER — MIDAZOLAM HCL 5 MG/5ML IJ SOLN
INTRAMUSCULAR | Status: DC | PRN
  Administered 2018-05-27: 2 mg via INTRAVENOUS

## 2018-05-27 MED ORDER — LACTATED RINGERS IV SOLN
INTRAVENOUS | Status: DC
  Administered 2018-05-27: 07:00:00 via INTRAVENOUS

## 2018-05-27 MED ORDER — HEPARIN (PORCINE) IN NACL 1000-0.9 UT/500ML-% IV SOLN
INTRAVENOUS | Status: AC
  Filled 2018-05-27: qty 500

## 2018-05-27 MED ORDER — OXYCODONE HCL 5 MG PO TABS: 5.0000 mg | ORAL_TABLET | Freq: Four times a day (QID) | ORAL | 0 refills | Status: DC | PRN

## 2018-05-27 MED ORDER — ONDANSETRON HCL 4 MG/2ML IJ SOLN
INTRAMUSCULAR | Status: AC
  Filled 2018-05-27: qty 2

## 2018-05-27 MED ORDER — ONDANSETRON HCL 4 MG/2ML IJ SOLN
INTRAMUSCULAR | Status: DC | PRN
  Administered 2018-05-27: 4 mg via INTRAVENOUS

## 2018-05-27 SURGICAL SUPPLY — 39 items
BAG DECANTER FOR FLEXI CONT (MISCELLANEOUS) ×3 IMPLANT
BLADE HEX COATED 2.75 (ELECTRODE) ×3 IMPLANT
BLADE SURG 15 STRL LF DISP TIS (BLADE) ×1 IMPLANT
BLADE SURG 15 STRL SS (BLADE) ×2
CANISTER SUCT 1200ML W/VALVE (MISCELLANEOUS) IMPLANT
CHLORAPREP W/TINT 26ML (MISCELLANEOUS) ×3 IMPLANT
COVER BACK TABLE 60X90IN (DRAPES) ×3 IMPLANT
COVER MAYO STAND STRL (DRAPES) ×3 IMPLANT
DECANTER SPIKE VIAL GLASS SM (MISCELLANEOUS) IMPLANT
DERMABOND ADVANCED (GAUZE/BANDAGES/DRESSINGS) ×2
DERMABOND ADVANCED .7 DNX12 (GAUZE/BANDAGES/DRESSINGS) ×1 IMPLANT
DRAPE C-ARM 42X72 X-RAY (DRAPES) ×3 IMPLANT
DRAPE LAPAROSCOPIC ABDOMINAL (DRAPES) ×3 IMPLANT
DRAPE UTILITY XL STRL (DRAPES) ×3 IMPLANT
ELECT REM PT RETURN 9FT ADLT (ELECTROSURGICAL) ×3
ELECTRODE REM PT RTRN 9FT ADLT (ELECTROSURGICAL) ×1 IMPLANT
GLOVE SURG SIGNA 7.5 PF LTX (GLOVE) ×3 IMPLANT
GOWN STRL REUS W/ TWL LRG LVL3 (GOWN DISPOSABLE) ×1 IMPLANT
GOWN STRL REUS W/ TWL XL LVL3 (GOWN DISPOSABLE) ×1 IMPLANT
GOWN STRL REUS W/TWL LRG LVL3 (GOWN DISPOSABLE) ×2
GOWN STRL REUS W/TWL XL LVL3 (GOWN DISPOSABLE) ×2
IV KIT MINILOC 20X1 SAFETY (NEEDLE) IMPLANT
KIT PORT POWER 8FR ISP CVUE (Port) ×3 IMPLANT
NEEDLE HYPO 25X1 1.5 SAFETY (NEEDLE) ×3 IMPLANT
PACK BASIN DAY SURGERY FS (CUSTOM PROCEDURE TRAY) ×3 IMPLANT
PENCIL BUTTON HOLSTER BLD 10FT (ELECTRODE) ×3 IMPLANT
SLEEVE SCD COMPRESS KNEE MED (MISCELLANEOUS) ×3 IMPLANT
SUT MNCRL AB 4-0 PS2 18 (SUTURE) ×3 IMPLANT
SUT PROLENE 2 0 SH DA (SUTURE) ×3 IMPLANT
SUT SILK 2 0 TIES 17X18 (SUTURE)
SUT SILK 2-0 18XBRD TIE BLK (SUTURE) IMPLANT
SUT VIC AB 3-0 SH 27 (SUTURE) ×2
SUT VIC AB 3-0 SH 27X BRD (SUTURE) ×1 IMPLANT
SYR CONTROL 10ML LL (SYRINGE) ×3 IMPLANT
TOWEL GREEN STERILE FF (TOWEL DISPOSABLE) ×3 IMPLANT
TOWEL OR NON WOVEN STRL DISP B (DISPOSABLE) ×3 IMPLANT
TUBE CONNECTING 20'X1/4 (TUBING)
TUBE CONNECTING 20X1/4 (TUBING) IMPLANT
YANKAUER SUCT BULB TIP NO VENT (SUCTIONS) IMPLANT

## 2018-05-27 NOTE — Anesthesia Preprocedure Evaluation (Signed)
Anesthesia Evaluation  Patient identified by MRN, date of birth, ID band Patient awake    Reviewed: Allergy & Precautions, H&P , NPO status , Patient's Chart, lab work & pertinent test results  Airway Mallampati: II  TM Distance: >3 FB Neck ROM: full    Dental no notable dental hx.    Pulmonary neg pulmonary ROS,    Pulmonary exam normal breath sounds clear to auscultation       Cardiovascular hypertension, Pt. on medications  Rhythm:regular Rate:Normal     Neuro/Psych negative neurological ROS     GI/Hepatic negative GI ROS, Neg liver ROS,   Endo/Other  negative endocrine ROS  Renal/GU negative Renal ROS     Musculoskeletal   Abdominal   Peds  Hematology negative hematology ROS (+)   Anesthesia Other Findings   Reproductive/Obstetrics                             Anesthesia Physical  Anesthesia Plan  ASA: II  Anesthesia Plan: General   Post-op Pain Management:    Induction: Intravenous  PONV Risk Score and Plan: 3 and Dexamethasone, Ondansetron, Treatment may vary due to age or medical condition and Midazolam  Airway Management Planned: LMA  Additional Equipment:   Intra-op Plan:   Post-operative Plan: Extubation in OR  Informed Consent: I have reviewed the patients History and Physical, chart, labs and discussed the procedure including the risks, benefits and alternatives for the proposed anesthesia with the patient or authorized representative who has indicated his/her understanding and acceptance.   Dental Advisory Given  Plan Discussed with: CRNA and Surgeon  Anesthesia Plan Comments: ( )        Anesthesia Quick Evaluation

## 2018-05-27 NOTE — Anesthesia Procedure Notes (Signed)
Procedure Name: LMA Insertion Date/Time: 05/27/2018 7:34 AM Performed by: Talbot Grumbling, CRNA Pre-anesthesia Checklist: Patient identified, Emergency Drugs available, Suction available and Patient being monitored Patient Re-evaluated:Patient Re-evaluated prior to induction Oxygen Delivery Method: Circle system utilized Preoxygenation: Pre-oxygenation with 100% oxygen Induction Type: IV induction Ventilation: Mask ventilation without difficulty LMA: LMA inserted LMA Size: 4.0 Number of attempts: 1 Placement Confirmation: positive ETCO2 and breath sounds checked- equal and bilateral Tube secured with: Tape Dental Injury: Teeth and Oropharynx as per pre-operative assessment

## 2018-05-27 NOTE — Discharge Instructions (Signed)
Ok to shower starting tomorrow  Ice pack, tylenol, ibuprofen for pain  No vigorous activity for one week   Post Anesthesia Home Care Instructions  Activity: Get plenty of rest for the remainder of the day. A responsible individual must stay with you for 24 hours following the procedure.  For the next 24 hours, DO NOT: -Drive a car -Paediatric nurse -Drink alcoholic beverages -Take any medication unless instructed by your physician -Make any legal decisions or sign important papers.  Meals: Start with liquid foods such as gelatin or soup. Progress to regular foods as tolerated. Avoid greasy, spicy, heavy foods. If nausea and/or vomiting occur, drink only clear liquids until the nausea and/or vomiting subsides. Call your physician if vomiting continues.  Special Instructions/Symptoms: Your throat may feel dry or sore from the anesthesia or the breathing tube placed in your throat during surgery. If this causes discomfort, gargle with warm salt water. The discomfort should disappear within 24 hours.  If you had a scopolamine patch placed behind your ear for the management of post- operative nausea and/or vomiting:  1. The medication in the patch is effective for 72 hours, after which it should be removed.  Wrap patch in a tissue and discard in the trash. Wash hands thoroughly with soap and water. 2. You may remove the patch earlier than 72 hours if you experience unpleasant side effects which may include dry mouth, dizziness or visual disturbances. 3. Avoid touching the patch. Wash your hands with soap and water after contact with the patch.

## 2018-05-27 NOTE — Anesthesia Postprocedure Evaluation (Signed)
Anesthesia Post Note  Patient: Anna Conrad  Procedure(s) Performed: INSERTION PORT-A-CATH (Left Chest)     Patient location during evaluation: PACU Anesthesia Type: General Level of consciousness: awake and alert Pain management: pain level controlled Vital Signs Assessment: post-procedure vital signs reviewed and stable Respiratory status: spontaneous breathing, nonlabored ventilation, respiratory function stable and patient connected to nasal cannula oxygen Cardiovascular status: blood pressure returned to baseline and stable Postop Assessment: no apparent nausea or vomiting Anesthetic complications: no    Last Vitals:  Vitals:   05/27/18 0900 05/27/18 0919  BP: 105/82 99/73  Pulse: (!) 57 (!) 59  Resp: 20 18  Temp:  (!) 36.4 C  SpO2: 97% 98%    Last Pain:  Vitals:   05/27/18 0919  TempSrc:   PainSc: 0-No pain                 Tiajuana Amass

## 2018-05-27 NOTE — Op Note (Signed)
INSERTION PORT-Conrad-CATH  Procedure Note  Anna Conrad 05/27/2018   Pre-op Diagnosis: Breast Cancer     Post-op Diagnosis: same  Procedure(s): LEFT SUBCLAVIAN VEIN INSERTION PORT-Conrad-CATH (8 FR)  Surgeon(s): [name], MD  Anesthesia: General  Staff:  Circulator: Candi Leash, RN Scrub Person: Silvio Clayman, CST Circulator Assistant: Durwin Reges, RN  Estimated Blood Loss: Minimal               Procedure: The patient was brought to the operating room and identified as the correct patient.  She was placed supine on the operating room table and general anesthesia was induced.  Her chest and neck were then prepped and draped in the usual sterile fashion.  I anesthetized the skin and clavicle in the left chest with Marcaine.  I then used the introducer needle with the patient in the Trendelenburg position and cannulated the left subclavian vein.  Conrad wire was passed through the needle and into the central venous system.  Fluoroscopy confirmed placement in the central venous system.  I anesthetized the skin further at the needle introduction site with Marcaine and made an incision with Conrad scalpel.  I then created Conrad port pocket with the electrocautery.  An 8 [place] Port-Conrad-Cath was brought to the field.  It fit easily into the pocket.  I fed the introducer sheath and dilator over the wire and into the central venous system easily.  I then attached the catheter to the port.  Good flush and return were demonstrated.  The port was placed in the pocket and the catheter was fed down the peel-away sheath.  The sheath was completely peeled away and removed leaving the port and central venous system.  Again this was confirmed under fluoroscopy.  I accessed the port and good flush and return were demonstrated.  The port was sewn to the chest wall with 2 separate 3-0 Prolene sutures.  I then closed the subcutaneous tissue with interrupted 3-0 Vicryl sutures and closed the skin  with Conrad running 4-0 Monocryl.  Dermabond was then applied.  I again accessed the port.  Again return was demonstrated and I instilled concentrated heparin solution into the port.  Dermabond was then applied.  The patient tolerated procedure well.  All the counts were correct at the end of the procedure.  The patient was then extubated in the operating room and taken in Conrad stable condition to the recovery room.          Anna Conrad   Date: 05/27/2018  Time: 8:04 AM

## 2018-05-27 NOTE — Interval H&P Note (Signed)
History and Physical Interval Note:no change in H and P  05/27/2018 7:00 AM  Anna Conrad  has presented today for surgery, with the diagnosis of Breast Cancer  The various methods of treatment have been discussed with the patient and family. After consideration of risks, benefits and other options for treatment, the patient has consented to  Procedure(s): INSERTION PORT-A-CATH (N/A) as a surgical intervention .  The patient's history has been reviewed, patient examined, no change in status, stable for surgery.  I have reviewed the patient's chart and labs.  Questions were answered to the patient's satisfaction.     Elleigh Cassetta A

## 2018-05-27 NOTE — Transfer of Care (Signed)
Immediate Anesthesia Transfer of Care Note  Patient: Anna Conrad  Procedure(s) Performed: INSERTION PORT-A-CATH (N/A )  Patient Location: PACU  Anesthesia Type:General  Level of Consciousness: awake, alert  and oriented  Airway & Oxygen Therapy: Patient Spontanous Breathing and Patient connected to face mask oxygen  Post-op Assessment: Report given to RN and Post -op Vital signs reviewed and stable  Post vital signs: Reviewed and stable  Last Vitals:  Vitals Value Taken Time  BP    Temp    Pulse 69 05/27/2018  8:09 AM  Resp 14 05/27/2018  8:09 AM  SpO2 100 % 05/27/2018  8:09 AM  Vitals shown include unvalidated device data.  Last Pain:  Vitals:   05/27/18 0709  TempSrc: Oral  PainSc: 0-No pain         Complications: No apparent anesthesia complications

## 2018-05-27 NOTE — Progress Notes (Signed)
[place]  [phone] [phone]     ID: Anna Conrad DOB: [birth year]  MR#: [id number]  FFM#:[id number]  Patient Care Team: Filiberto Pinks as PCP - General (Physician Assistant) Michah Minton, Virgie Dad, MD as Consulting Physician (Oncology) Kyung Rudd, MD as Consulting Physician (Radiation Oncology) [name], MD as Consulting Physician (General Surgery) Janyth Pupa, DO as Consulting Physician (Obstetrics and Gynecology) Janyth Pupa, DO as Consulting Physician (Obstetrics and Gynecology) OTHER MD:  CHIEF COMPLAINT: Estrogen receptor positive breast cancer  CURRENT TREATMENT: Adjuvant chemotherapy  INTERVAL HISTORY: Anna Conrad for follow up and treatment of her estrogen receptor positive breast cancer . She underwent port insertion on 05/26/2018. She will begin cycle 1 of 4 planned cycle of cyclophosphamide and doxorubicin on 06/04/2018 given every 14 days followed by weekly paclitaxel x12. She tolerated port placement well.   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 She denies unusual headaches, visual changes, nausea, vomiting, or dizziness. There has been no unusual cough, phlegm production, or pleurisy. This been no change in bowel or bladder habits. She denies unexplained fatigue or unexplained weight loss, bleeding, rash, or fever. A detailed review of systems was otherwise stable.    HISTORY OF CURRENT ILLNESS: From the original intake note:  [name] had routine screening mammography on 01/29/2018 showing a possible mass with associated calcifications in the right breast. She underwent unilateral right diagnostic mammography with tomography and right breast ultrasonography at [facility] on 02/05/2018 showing a highly suspicious mass over the 10:30 position upper outer of the right breast located 6 cm from the nipple measuring 1.3 x 1.4 x 1.5 cm. Associated microcalcifications corresponding to the mammographic abnormality. Ultrasound of the right axilla was normal.  Accordingly on 02/29/2019 she proceeded to biopsy of the right breast area in question. 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 Prognostic indicators significant for: estrogen receptor, 100% positive with strong staining intensity and progesterone receptor, 20% positive iwht moderate staining intensity. Proliferation marker Ki67 at 10%. HER2 not amplified with ratios HER2/CEP17 signals 1.17 and average HER2 copies per cell 2.45.  On 02/21/2018 the patient had bilateral breast MRI.  This measured the upper outer quadrant right breast mass at 2.3 cm including an area of surrounding non-masslike enhancement.  There was no evidence of multifocal or multicentric disease, no lymphadenopathy, and no findings in the left breast.  The patient's subsequent history is as detailed below.    PAST MEDICAL HISTORY: Past Medical History:  Diagnosis Date  . Blood dyscrasia    hx blood clott superficial in rt arm  . Cancer [facility]) 2019   right breast cancer  . Family history of breast cancer   . Heart murmur   . Hypertension   She used to have migraines. She denies GERD, asthma, emphysema. She was told that she had a slight heart murmur. She denies heart palpitations.   PAST SURGICAL HISTORY: Past Surgical History:  Procedure Laterality Date  . BREAST LUMPECTOMY WITH RADIOACTIVE SEED AND SENTINEL LYMPH NODE BIOPSY Right 03/18/2018   Procedure: RIGHT BREAST PARTIAL MASTECTOMY WITH RADIOACTIVE SEED AND SENTINEL LYMPH NODE BIOPSY;  Surgeon: [name], MD;  Location: [place];  Service: General;  Laterality: Right;  .  cesearean section     tubal  ligation   . PORTACATH PLACEMENT Left 05/27/2018   Procedure: INSERTION PORT-A-CATH;  Surgeon: [name], MD;  Location: [place];  Service: General;  Laterality: Left;  . TUBAL LIGATION      2 Caesarian sections- tubal ligation at the second c-section.  FAMILY HISTORY Family History  Problem Relation Age of Onset  . Breast cancer Sister 68       approximate  . Cancer Father 37       started in shoulder, spread to spine, brain, lung  . Cancer Paternal [name]        'blood cancer'  . Heart attack Paternal Uncle   . Breast cancer Cousin        age dx unk  . Breast cancer Cousin        age dx unk  . Breast cancer Cousin        age dx unk   The patient's father passed away in the summer 2018 at the age of 34 due to metastatic lung cancer. He was a previous smoker. The patient's mother is alive at age 26. The patient has 1 brother and 1 sister. The patient's sister was diagnosed with breast cancer at the age of 81.  The patient has 3 paternal cousins diagnosed with breast cancer. She denies a family history of ovarian cancer.    GYNECOLOGIC HISTORY:  The patient's periods currently are about twice a month.   Menarche: 52 years old. She used to be on a dance team.  Age at first live birth: 52 years old She is Traer P2. She was taking oral contraceptives, but this has been discontinued as of January 2019. She had bilateral tubal ligation at the time of her second c-section.    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  She has a PhD.  Her husband, [name], is a Chief Strategy Officer for [employer], making metal parts. He travels about 2-3 times per year. Their sons are [name] age 84, and [name] age 16. She attends [employer].     ADVANCED DIRECTIVES:    HEALTH MAINTENANCE: Social History   Tobacco Use  . Smoking status: Never Smoker  . Smokeless tobacco: Never Used  Substance Use Topics  . Alcohol use: Yes     Comment: occ  . Drug use: Never     Colonoscopy:n/a  PAP: 2017/ normal  Bone density: n/a   Allergies  Allergen Reactions  . Latex Rash    Red  rash    Current Outpatient Medications  Medication Sig Dispense Refill  . acetaminophen (TYLENOL) 500 MG tablet Take 1,000 mg by mouth every 6 (six) hours as needed (for pain.).    Marland Kitchen ibuprofen (ADVIL,MOTRIN) 200 MG tablet Take 400 mg by mouth every 8 (eight) hours as needed (for pain.).    Marland Kitchen lisinopril-hydrochlorothiazide (PRINZIDE,ZESTORETIC) 10-12.5 MG tablet Take 1 tablet by mouth daily.    Marland Kitchen oxyCODONE (OXY IR/ROXICODONE) 5 MG immediate release tablet Take 1 tablet (5 mg total) by mouth every 6 (six) hours as needed for moderate pain or severe pain. 15 tablet 0   No current facility-administered medications for this visit.     OBJECTIVE: Middle-aged white woman in no acute distress  Vitals:   05/28/18 1428  BP: 111/73  Pulse: 72  Resp: 18  Temp: 98.5 F (36.9 C)  SpO2: 98%     Body mass index is 33.18 kg/m.  Wt Readings from Last 3 Encounters:  05/28/18 193 lb 4.8 oz (87.7 kg)  05/27/18 196 lb (88.9 kg)  04/05/18 189 lb 11.2 oz (86 kg)      ECOG FS:0 - Asymptomatic  Sclerae unicteric, pupils round and equal Oropharynx clear and moist No cervical or supraclavicular adenopathy Lungs no rales or rhonchi Heart regular rate and rhythm Abd soft, nontender, positive bowel sounds MSK no focal spinal tenderness, no upper extremity lymphedema Neuro: nonfocal, well oriented, appropriate affect Breasts: The right breast status post lumpectomy.  The left breast is benign.  Both axilla are benign Skin: Port site intact  LAB RESULTS:  CMP     Component Value Date/Time   NA 139 05/23/2018 1625   K 3.8 05/23/2018 1625   CL 102 05/23/2018 1625   CO2 27 05/23/2018 1625   GLUCOSE 101 (H) 05/23/2018 1625   BUN 16 05/23/2018 1625   CREATININE 1.06 (H) 05/23/2018 1625   CALCIUM 9.4 05/23/2018 1625   GFRNONAA >60 05/23/2018  1625   GFRAA >60 05/23/2018 1625    No results found for: TOTALPROTELP, ALBUMINELP, A1GS, A2GS, BETS, BETA2SER, GAMS, MSPIKE, SPEI  No results found for: KPAFRELGTCHN, LAMBDASER, KAPLAMBRATIO  Lab Results  Component Value Date   WBC 7.3 03/12/2018   HGB 13.6 03/12/2018   HCT 41.8 03/12/2018   MCV 94.6 03/12/2018   PLT 306 03/12/2018    _0 @  No results found for: LABCA2  No components found for: [id number]  No results for input(s): INR in the last 168 hours.  No results found for: LABCA2  No results found for: KZL935  No results found for: TSV779  No results found for: TJQ300  No results found for: CA2729  No components found for: HGQUANT  No results found for: CEA1 / No results found for: CEA1   No results found for: AFPTUMOR  No results found for: CHROMOGRNA  No results found for: PSA1  Admission on 05/27/2018, Discharged on 05/27/2018  Component Date Value Ref Range Status  . Sodium 05/23/2018 139  135 - 145 mmol/L Final  . Potassium 05/23/2018 3.8  3.5 - 5.1 mmol/L Final  . Chloride 05/23/2018 102  101 - 111 mmol/L Final  . CO2 05/23/2018 27  22 - 32 mmol/L Final  . Glucose, Bld 05/23/2018 101* 65 - 99 mg/dL Final  . BUN 05/23/2018 16  6 - 20 mg/dL Final  . Creatinine, Ser 05/23/2018 1.06* 0.44 - 1.00 mg/dL Final  . Calcium 05/23/2018 9.4  8.9 - 10.3 mg/dL Final  . GFR calc non Af Amer 05/23/2018 >60  >60 mL/min Final  . GFR calc Af Amer 05/23/2018 >60  >60 mL/min Final   Comment: (NOTE) The eGFR has been calculated using the CKD EPI equation. This calculation has not been validated in all clinical situations. eGFR's persistently <60 mL/min signify possible Chronic Kidney Disease.   . Anion gap 05/23/2018 10  5 - 15 Final   Performed at [facility], [place] [address] [place], [place] [address]  . Preg Test, Ur 05/27/2018 NEGATIVE  NEGATIVE Final   Comment:        THE SENSITIVITY OF THIS METHODOLOGY IS >24 mIU/mL     (this  displays the last labs from the last 3 days)  No results found for: TOTALPROTELP, ALBUMINELP, A1GS, A2GS, BETS, BETA2SER, GAMS, MSPIKE, SPEI (this displays SPEP labs)  No results found for: KPAFRELGTCHN, LAMBDASER, KAPLAMBRATIO (kappa/lambda light chains)  No results found for: HGBA, HGBA2QUANT, HGBFQUANT, HGBSQUAN (Hemoglobinopathy  evaluation)   No results found for: LDH  No results found for: IRON, TIBC, IRONPCTSAT (Iron and TIBC)  No results found for: FERRITIN  Urinalysis No results found for: COLORURINE, APPEARANCEUR, LABSPEC, PHURINE, GLUCOSEU, HGBUR, BILIRUBINUR, KETONESUR, PROTEINUR, UROBILINOGEN, NITRITE, LEUKOCYTESUR   STUDIES: Dg Chest Port 1 View  Result Date: 05/27/2018 CLINICAL DATA:  Port-A-Cath placement EXAM: PORTABLE CHEST 1 VIEW COMPARISON:  None. FINDINGS: Port-A-Cath tip is in the superior vena cava. No pneumothorax. There is no edema or consolidation. Heart is borderline prominent with pulmonary vascularity normal. No adenopathy. No bone lesions. IMPRESSION: Port-A-Cath tip in the superior vena cava. No pneumothorax. No edema or consolidation. Heart borderline prominent with pulmonary vascularity normal. Electronically Signed   By: Lowella Grip III M.D.   On: 05/27/2018 09:00   Dg Fluoro Guide Cv Line-no Report  Result Date: 05/27/2018 Fluoroscopy was utilized by the requesting physician.  No radiographic interpretation.    ELIGIBLE FOR AVAILABLE RESEARCH PROTOCOL: BCEP   ASSESSMENT: 52 y.o. [place], [place] woman status post right breast upper outer quadrant biopsy 02/07/2018 for a clinical T1c-T2 N0, stage IA-B invasive ductal carcinoma, grade 2, estrogen receptor and progesterone receptor positive, HER-2 not amplified, with an MIB-1 of 10%  (1) status post right lumpectomy 03/18/2018 for a pT1c pN0, stage IA invasive ductal carcinoma, grade 2, estrogen and progesterone receptor positive, with negative margins.  A total of 1 lymph node was removed  (2)  Mammaprint on 03/18/2018 showed high risk, indicating that with chemotherapy and hormone therapy, the patient would have a nearly 95% chance of having no distant metastases within the next 5 years  (3) chemotherapy will consist of doxorubicin and cyclophosphamide in dose dense fashion x4 starting 06/04/2018, to be followed by weekly paclitaxel x12  (a) baseline echocardiogram on 05/23/2018 showed an ejection fraction in the 65-75% range  (4) adjuvant radiation to follow  (5) antiestrogens to follow at the completion of local treatment  (6) genetics testing 03/13/2018 through the Common Hereditary Cancer Panel offered by [facility] ifound no deleterious mutations in APC, ATM, AXIN2, BARD1, BMPR1A, BRCA1, BRCA2, BRIP1, CDH1, CDKN2A (p14ARF), CDKN2A (p16INK4a), CKD4, CHEK2, CTNNA1, DICER1, EPCAM (Deletion/duplication testing only), GREM1 (promoter region deletion/duplication testing only), KIT, MEN1, MLH1, MSH2, MSH3, MSH6, MUTYH, NBN, NF1, NHTL1, PALB2, PDGFRA, PMS2, POLD1, POLE, PTEN, RAD50, RAD51C, RAD51D, SDHB, SDHC, SDHD, SMAD4, SMARCA4. STK11, TP53, TSC1, TSC2, and VHL.  The following genes were evaluated for sequence changes only: SDHA and HOXB13 c.251G>A variant only.  (a)  A variant of uncertain significance (VUS) in the gene MSH3 was also identified c.1027+4T>C (Intronic).    PLAN: Shantese has a strong heart.  We discussed her echo in detail and I also mentioned the BCEP study which she is willing to consider.  We spent most of Conrad's visit going over her the "roadmap" on how to take her supportive medications.  We went through the names, the times, the method, and the possible side effects.  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  She has detailed instructions on managing side effects but nevertheless things  pop up that are not anticipated and it is best if she lets is no early rather than later  I will see her again 06/12/2018 to troubleshoot problems from cycle #1   Asah Lamay, Virgie Dad, MD  05/28/18 2:48 PM Medical Oncology and Hematology [facility] [place], [place] [address] Tel. [phone]    Fax. [phone]  Alice Rieger, am acting as scribe for Chauncey Cruel MD.  I, Lurline Del MD, have reviewed the above documentation for accuracy and completeness, and I agree with the above.

## 2018-05-28 ENCOUNTER — Inpatient Hospital Stay: Payer: BC Managed Care – PPO

## 2018-05-28 ENCOUNTER — Encounter: Payer: Self-pay | Admitting: Oncology

## 2018-05-28 ENCOUNTER — Encounter: Payer: Self-pay | Admitting: *Deleted

## 2018-05-28 ENCOUNTER — Inpatient Hospital Stay (HOSPITAL_BASED_OUTPATIENT_CLINIC_OR_DEPARTMENT_OTHER): Payer: BC Managed Care – PPO | Admitting: Oncology

## 2018-05-28 ENCOUNTER — Telehealth: Payer: Self-pay | Admitting: Oncology

## 2018-05-28 ENCOUNTER — Encounter (HOSPITAL_BASED_OUTPATIENT_CLINIC_OR_DEPARTMENT_OTHER): Payer: Self-pay | Admitting: Surgery

## 2018-05-28 VITALS — BP 111/73 | HR 72 | Temp 98.5°F | Resp 18 | Ht 64.0 in | Wt 193.3 lb

## 2018-05-28 DIAGNOSIS — C50411 Malignant neoplasm of upper-outer quadrant of right female breast: Secondary | ICD-10-CM

## 2018-05-28 DIAGNOSIS — Z170 Estrogen receptor positive status [ER+]: Secondary | ICD-10-CM | POA: Diagnosis not present

## 2018-05-28 DIAGNOSIS — Z5111 Encounter for antineoplastic chemotherapy: Secondary | ICD-10-CM | POA: Diagnosis present

## 2018-05-28 DIAGNOSIS — Z5189 Encounter for other specified aftercare: Secondary | ICD-10-CM | POA: Diagnosis not present

## 2018-05-28 MED ORDER — DEXAMETHASONE 4 MG PO TABS: ORAL_TABLET | ORAL | 1 refills | Status: DC

## 2018-05-28 MED ORDER — LIDOCAINE-PRILOCAINE 2.5-2.5 % EX CREA: TOPICAL_CREAM | CUTANEOUS | 3 refills | Status: DC

## 2018-05-28 MED ORDER — PROCHLORPERAZINE MALEATE 10 MG PO TABS: 10.0000 mg | ORAL_TABLET | Freq: Four times a day (QID) | ORAL | 1 refills | Status: DC | PRN

## 2018-05-28 MED ORDER — LORAZEPAM 0.5 MG PO TABS: 0.5000 mg | ORAL_TABLET | Freq: Every evening | ORAL | 0 refills | Status: DC | PRN

## 2018-05-28 NOTE — Progress Notes (Signed)
Returned patient's call from voicemail regarding financial concerns.  Introduced myself as Arboriculturist and my role. Asked patient if she has met her ded/OOP for insurance. She states she thinks she has. Advised her if this is the case, she will not need copay assistance for treatment but she may confirm with her insurance company. Advised that any payment arrangements for bills received would be made via calling the number on the bills. She verbalized understanding.  Discussed the one-time $1000 Advertising account executive. Gave patient income guidelines. Based on verbal information she provided for a household of 4, she qualifies. Patient will bring proof of income today at 1:45 for our appointment before her doctor appointment. She has my name and contact number for any additional financial questions or concerns.

## 2018-05-28 NOTE — Telephone Encounter (Signed)
Gave patient avs and calendar of upcoming appointments.  °

## 2018-05-29 ENCOUNTER — Ambulatory Visit: Payer: BC Managed Care – PPO | Admitting: Oncology

## 2018-05-30 ENCOUNTER — Telehealth: Payer: Self-pay | Admitting: Oncology

## 2018-05-30 NOTE — Telephone Encounter (Signed)
Left message for patient regarding upcoming June appointments.

## 2018-05-31 ENCOUNTER — Telehealth: Payer: Self-pay

## 2018-05-31 NOTE — Telephone Encounter (Signed)
Due to resources error Levada Dy) had to r/s the 7/3 appointment. Per 6/21 error correction with the new resource scheduling

## 2018-06-04 ENCOUNTER — Inpatient Hospital Stay: Payer: BC Managed Care – PPO

## 2018-06-04 ENCOUNTER — Telehealth: Payer: Self-pay

## 2018-06-04 ENCOUNTER — Other Ambulatory Visit: Payer: BC Managed Care – PPO

## 2018-06-04 ENCOUNTER — Ambulatory Visit: Payer: BC Managed Care – PPO

## 2018-06-04 VITALS — BP 136/80 | HR 65 | Temp 98.2°F | Resp 17

## 2018-06-04 DIAGNOSIS — C50411 Malignant neoplasm of upper-outer quadrant of right female breast: Secondary | ICD-10-CM

## 2018-06-04 DIAGNOSIS — Z95828 Presence of other vascular implants and grafts: Secondary | ICD-10-CM | POA: Insufficient documentation

## 2018-06-04 DIAGNOSIS — Z170 Estrogen receptor positive status [ER+]: Principal | ICD-10-CM

## 2018-06-04 DIAGNOSIS — Z5111 Encounter for antineoplastic chemotherapy: Secondary | ICD-10-CM | POA: Diagnosis not present

## 2018-06-04 LAB — CBC WITH DIFFERENTIAL/PLATELET
BASOS PCT: 1 %
Basophils Absolute: 0.1 10*3/uL (ref 0.0–0.1)
Eosinophils Absolute: 0.6 10*3/uL — ABNORMAL HIGH (ref 0.0–0.5)
Eosinophils Relative: 8 %
HEMATOCRIT: 36.6 % (ref 34.8–46.6)
HEMOGLOBIN: 12.6 g/dL (ref 11.6–15.9)
LYMPHS ABS: 1.8 10*3/uL (ref 0.9–3.3)
Lymphocytes Relative: 23 %
MCH: 31.8 pg (ref 25.1–34.0)
MCHC: 34.5 g/dL (ref 31.5–36.0)
MCV: 92.4 fL (ref 79.5–101.0)
MONOS PCT: 8 %
Monocytes Absolute: 0.6 10*3/uL (ref 0.1–0.9)
NEUTROS ABS: 4.5 10*3/uL (ref 1.5–6.5)
NEUTROS PCT: 60 %
Platelets: 265 10*3/uL (ref 145–400)
RBC: 3.96 MIL/uL (ref 3.70–5.45)
RDW: 12.7 % (ref 11.2–14.5)
WBC: 7.6 10*3/uL (ref 3.9–10.3)

## 2018-06-04 LAB — COMPREHENSIVE METABOLIC PANEL
ALK PHOS: 55 U/L (ref 38–126)
ALT: 27 U/L (ref 0–44)
ANION GAP: 8 (ref 5–15)
AST: 19 U/L (ref 15–41)
Albumin: 3.9 g/dL (ref 3.5–5.0)
BUN: 14 mg/dL (ref 6–20)
CALCIUM: 9.1 mg/dL (ref 8.9–10.3)
CHLORIDE: 104 mmol/L (ref 98–111)
CO2: 26 mmol/L (ref 22–32)
CREATININE: 0.87 mg/dL (ref 0.44–1.00)
GFR calc non Af Amer: >60 mL/min (ref >60)
Glucose, Bld: 87 mg/dL (ref 70–99)
Potassium: 3.6 mmol/L (ref 3.5–5.1)
SODIUM: 138 mmol/L (ref 135–145)
Total Bilirubin: 0.5 mg/dL (ref 0.3–1.2)
Total Protein: 6.7 g/dL (ref 6.5–8.1)

## 2018-06-04 MED ORDER — DOXORUBICIN HCL CHEMO IV INJECTION 2 MG/ML
60.0000 mg/m2 | Freq: Once | INTRAVENOUS | Status: AC
  Administered 2018-06-04: 118 mg via INTRAVENOUS
  Filled 2018-06-04: qty 59

## 2018-06-04 MED ORDER — SODIUM CHLORIDE 0.9% FLUSH
10.0000 mL | INTRAVENOUS | Status: DC | PRN
  Administered 2018-06-04: 10 mL
  Filled 2018-06-04: qty 10

## 2018-06-04 MED ORDER — SODIUM CHLORIDE 0.9 % IV SOLN
Freq: Once | INTRAVENOUS | Status: AC
  Administered 2018-06-04: 10:00:00 via INTRAVENOUS

## 2018-06-04 MED ORDER — PALONOSETRON HCL INJECTION 0.25 MG/5ML
0.2500 mg | Freq: Once | INTRAVENOUS | Status: AC
  Administered 2018-06-04: 0.25 mg via INTRAVENOUS

## 2018-06-04 MED ORDER — HEPARIN SOD (PORK) LOCK FLUSH 100 UNIT/ML IV SOLN
500.0000 [IU] | Freq: Once | INTRAVENOUS | Status: AC | PRN
  Administered 2018-06-04: 500 [IU]
  Filled 2018-06-04: qty 5

## 2018-06-04 MED ORDER — CYCLOPHOSPHAMIDE CHEMO INJECTION 1 GM
600.0000 mg/m2 | Freq: Once | INTRAMUSCULAR | Status: AC
  Administered 2018-06-04: 1180 mg via INTRAVENOUS
  Filled 2018-06-04: qty 59

## 2018-06-04 MED ORDER — SODIUM CHLORIDE 0.9 % IV SOLN
Freq: Once | INTRAVENOUS | Status: AC
  Administered 2018-06-04: 10:00:00 via INTRAVENOUS
  Filled 2018-06-04: qty 5

## 2018-06-04 MED ORDER — PALONOSETRON HCL INJECTION 0.25 MG/5ML
INTRAVENOUS | Status: AC
  Filled 2018-06-04: qty 5

## 2018-06-04 NOTE — Patient Instructions (Signed)
[facility] Discharge Instructions for Patients Receiving Chemotherapy  Today you received the following chemotherapy agents: Adriamycin, Cytoxan  To help prevent nausea and vomiting after your treatment, we encourage you to take your nausea medication as directed.   If you develop nausea and vomiting that is not controlled by your nausea medication, call the clinic.   BELOW ARE SYMPTOMS THAT SHOULD BE REPORTED IMMEDIATELY:  *FEVER GREATER THAN 100.5 F  *CHILLS WITH OR WITHOUT FEVER  NAUSEA AND VOMITING THAT IS NOT CONTROLLED WITH YOUR NAUSEA MEDICATION  *UNUSUAL SHORTNESS OF BREATH  *UNUSUAL BRUISING OR BLEEDING  TENDERNESS IN MOUTH AND THROAT WITH OR WITHOUT PRESENCE OF ULCERS  *URINARY PROBLEMS  *BOWEL PROBLEMS  UNUSUAL RASH Items with * indicate a potential emergency and should be followed up as soon as possible.  Feel free to call the clinic should you have any questions or concerns. The clinic phone number is [phone].  Please show the [place] at check-in to the Emergency Department and triage nurse.  Doxorubicin injection What is this medicine? DOXORUBICIN (dox oh ROO bi sin) is a chemotherapy drug. It is used to treat many kinds of cancer like leukemia, lymphoma, neuroblastoma, sarcoma, and Wilms' tumor. It is also used to treat bladder cancer, breast cancer, lung cancer, ovarian cancer, stomach cancer, and thyroid cancer. This medicine may be used for other purposes; ask your health care provider or pharmacist if you have questions. COMMON BRAND NAME(S): Adriamycin, Adriamycin PFS, Adriamycin RDF, Rubex What should I tell my health care provider before I take this medicine? They need to know if you have any of these conditions: -heart disease -history of low blood counts caused by a medicine -liver disease -recent or ongoing radiation therapy -an unusual or allergic reaction to doxorubicin, other chemotherapy agents, other medicines,  foods, dyes, or preservatives -pregnant or trying to get pregnant -breast-feeding How should I use this medicine? This drug is given as an infusion into a vein. It is administered in a hospital or clinic by a specially trained health care professional. If you have pain, swelling, burning or any unusual feeling around the site of your injection, tell your health care professional right away. Talk to your pediatrician regarding the use of this medicine in children. Special care may be needed. Overdosage: If you think you have taken too much of this medicine contact a poison control center or emergency room at once. NOTE: This medicine is only for you. Do not share this medicine with others. What if I miss a dose? It is important not to miss your dose. Call your doctor or health care professional if you are unable to keep an appointment. What may interact with this medicine? This medicine may interact with the following medications: -6-mercaptopurine -paclitaxel -phenytoin -St. John's Wort -trastuzumab -verapamil This list may not describe all possible interactions. Give your health care provider a list of all the medicines, herbs, non-prescription drugs, or dietary supplements you use. Also tell them if you smoke, drink alcohol, or use illegal drugs. Some items may interact with your medicine. What should I watch for while using this medicine? This drug may make you feel generally unwell. This is not uncommon, as chemotherapy can affect healthy cells as well as cancer cells. Report any side effects. Continue your course of treatment even though you feel ill unless your doctor tells you to stop. There is a maximum amount of this medicine you should receive throughout your life. The amount depends on the  medical condition being treated and your overall health. Your doctor will watch how much of this medicine you receive in your lifetime. Tell your doctor if you have taken this medicine before. You  may need blood work done while you are taking this medicine. Your urine may turn red for a few days after your dose. This is not blood. If your urine is dark or brown, call your doctor. In some cases, you may be given additional medicines to help with side effects. Follow all directions for their use. Call your doctor or health care professional for advice if you get a fever, chills or sore throat, or other symptoms of a cold or flu. Do not treat yourself. This drug decreases your body's ability to fight infections. Try to avoid being around people who are sick. This medicine may increase your risk to bruise or bleed. Call your doctor or health care professional if you notice any unusual bleeding. Talk to your doctor about your risk of cancer. You may be more at risk for certain types of cancers if you take this medicine. Do not become pregnant while taking this medicine or for 6 months after stopping it. Women should inform their doctor if they wish to become pregnant or think they might be pregnant. Men should not father a child while taking this medicine and for 6 months after stopping it. There is a potential for serious side effects to an unborn child. Talk to your health care professional or pharmacist for more information. Do not breast-feed an infant while taking this medicine. This medicine has caused ovarian failure in some women and reduced sperm counts in some men This medicine may interfere with the ability to have a child. Talk with your doctor or health care professional if you are concerned about your fertility. What side effects may I notice from receiving this medicine? Side effects that you should report to your doctor or health care professional as soon as possible: -allergic reactions like skin rash, itching or hives, swelling of the face, lips, or tongue -breathing problems -chest pain -fast or irregular heartbeat -low blood counts - this medicine may decrease the number of white  blood cells, red blood cells and platelets. You may be at increased risk for infections and bleeding. -pain, redness, or irritation at site where injected -signs of infection - fever or chills, cough, sore throat, pain or difficulty passing urine -signs of decreased platelets or bleeding - bruising, pinpoint red spots on the skin, black, tarry stools, blood in the urine -swelling of the ankles, feet, hands -tiredness -weakness Side effects that usually do not require medical attention (report to your doctor or health care professional if they continue or are bothersome): -diarrhea -hair loss -mouth sores -nail discoloration or damage -nausea -red colored urine -vomiting This list may not describe all possible side effects. Call your doctor for medical advice about side effects. You may report side effects to FDA at 1-800-FDA-1088. Where should I keep my medicine? This drug is given in a hospital or clinic and will not be stored at home. NOTE: This sheet is a summary. It may not cover all possible information. If you have questions about this medicine, talk to your doctor, pharmacist, or health care provider.  2018 Elsevier/Gold Standard (2016-01-24 11:28:51)  Cyclophosphamide injection What is this medicine? CYCLOPHOSPHAMIDE (sye kloe FOSS fa mide) is a chemotherapy drug. It slows the growth of cancer cells. This medicine is used to treat many types of cancer like lymphoma, myeloma,  leukemia, breast cancer, and ovarian cancer, to name a few. This medicine may be used for other purposes; ask your health care provider or pharmacist if you have questions. COMMON BRAND NAME(S): Cytoxan, Neosar What should I tell my health care provider before I take this medicine? They need to know if you have any of these conditions: -blood disorders -history of other chemotherapy -infection -kidney disease -liver disease -recent or ongoing radiation therapy -tumors in the bone marrow -an unusual or  allergic reaction to cyclophosphamide, other chemotherapy, other medicines, foods, dyes, or preservatives -pregnant or trying to get pregnant -breast-feeding How should I use this medicine? This drug is usually given as an injection into a vein or muscle or by infusion into a vein. It is administered in a hospital or clinic by a specially trained health care professional. Talk to your pediatrician regarding the use of this medicine in children. Special care may be needed. Overdosage: If you think you have taken too much of this medicine contact a poison control center or emergency room at once. NOTE: This medicine is only for you. Do not share this medicine with others. What if I miss a dose? It is important not to miss your dose. Call your doctor or health care professional if you are unable to keep an appointment. What may interact with this medicine? This medicine may interact with the following medications: -amiodarone -amphotericin B -azathioprine -certain antiviral medicines for HIV or AIDS such as protease inhibitors (e.g., indinavir, ritonavir) and zidovudine -certain blood pressure medications such as benazepril, captopril, enalapril, fosinopril, lisinopril, moexipril, monopril, perindopril, quinapril, ramipril, trandolapril -certain cancer medications such as anthracyclines (e.g., daunorubicin, doxorubicin), busulfan, cytarabine, paclitaxel, pentostatin, tamoxifen, trastuzumab -certain diuretics such as chlorothiazide, chlorthalidone, hydrochlorothiazide, indapamide, metolazone -certain medicines that treat or prevent blood clots like warfarin -certain muscle relaxants such as succinylcholine -cyclosporine -etanercept -indomethacin -medicines to increase blood counts like filgrastim, pegfilgrastim, sargramostim -medicines used as general anesthesia -metronidazole -natalizumab This list may not describe all possible interactions. Give your health care provider a list of all the  medicines, herbs, non-prescription drugs, or dietary supplements you use. Also tell them if you smoke, drink alcohol, or use illegal drugs. Some items may interact with your medicine. What should I watch for while using this medicine? Visit your doctor for checks on your progress. This drug may make you feel generally unwell. This is not uncommon, as chemotherapy can affect healthy cells as well as cancer cells. Report any side effects. Continue your course of treatment even though you feel ill unless your doctor tells you to stop. Drink water or other fluids as directed. Urinate often, even at night. In some cases, you may be given additional medicines to help with side effects. Follow all directions for their use. Call your doctor or health care professional for advice if you get a fever, chills or sore throat, or other symptoms of a cold or flu. Do not treat yourself. This drug decreases your body's ability to fight infections. Try to avoid being around people who are sick. This medicine may increase your risk to bruise or bleed. Call your doctor or health care professional if you notice any unusual bleeding. Be careful brushing and flossing your teeth or using a toothpick because you may get an infection or bleed more easily. If you have any dental work done, tell your dentist you are receiving this medicine. You may get drowsy or dizzy. Do not drive, use machinery, or do anything that needs mental alertness until  you know how this medicine affects you. Do not become pregnant while taking this medicine or for 1 year after stopping it. Women should inform their doctor if they wish to become pregnant or think they might be pregnant. Men should not father a child while taking this medicine and for 4 months after stopping it. There is a potential for serious side effects to an unborn child. Talk to your health care professional or pharmacist for more information. Do not breast-feed an infant while taking  this medicine. This medicine may interfere with the ability to have a child. This medicine has caused ovarian failure in some women. This medicine has caused reduced sperm counts in some men. You should talk with your doctor or health care professional if you are concerned about your fertility. If you are going to have surgery, tell your doctor or health care professional that you have taken this medicine. What side effects may I notice from receiving this medicine? Side effects that you should report to your doctor or health care professional as soon as possible: -allergic reactions like skin rash, itching or hives, swelling of the face, lips, or tongue -low blood counts - this medicine may decrease the number of white blood cells, red blood cells and platelets. You may be at increased risk for infections and bleeding. -signs of infection - fever or chills, cough, sore throat, pain or difficulty passing urine -signs of decreased platelets or bleeding - bruising, pinpoint red spots on the skin, black, tarry stools, blood in the urine -signs of decreased red blood cells - unusually weak or tired, fainting spells, lightheadedness -breathing problems -dark urine -dizziness -palpitations -swelling of the ankles, feet, hands -trouble passing urine or change in the amount of urine -weight gain -yellowing of the eyes or skin Side effects that usually do not require medical attention (report to your doctor or health care professional if they continue or are bothersome): -changes in nail or skin color -hair loss -missed menstrual periods -mouth sores -nausea, vomiting This list may not describe all possible side effects. Call your doctor for medical advice about side effects. You may report side effects to FDA at 1-800-FDA-1088. Where should I keep my medicine? This drug is given in a hospital or clinic and will not be stored at home. NOTE: This sheet is a summary. It may not cover all possible  information. If you have questions about this medicine, talk to your doctor, pharmacist, or health care provider.  2018 Elsevier/Gold Standard (2012-10-11 16:22:58)

## 2018-06-04 NOTE — Telephone Encounter (Signed)
Pt called and wanting to know why she had an appt for Thursday. Told pt that she will need to come back for a neulasta injection. Explained what the injection is for and to start taking claritin po otc to help with bone pain. Pt confirmed time/date and verbalized understanding of all post chemo home management of side effects.

## 2018-06-05 ENCOUNTER — Encounter: Payer: Self-pay | Admitting: Pharmacist

## 2018-06-05 NOTE — Progress Notes (Signed)
Patient received 1st time Cytoxan infusion and rate was decreased from the 6103mL/hr to 4102mL/hr after complaints of sinus pain and disorientation. Once the rate was decreased the patient's symptoms cleared and she was able to complete the infusion without any further delays.

## 2018-06-06 ENCOUNTER — Inpatient Hospital Stay: Payer: BC Managed Care – PPO

## 2018-06-06 VITALS — BP 130/79 | HR 73 | Temp 98.6°F | Resp 18

## 2018-06-06 DIAGNOSIS — C50411 Malignant neoplasm of upper-outer quadrant of right female breast: Secondary | ICD-10-CM

## 2018-06-06 DIAGNOSIS — Z170 Estrogen receptor positive status [ER+]: Principal | ICD-10-CM

## 2018-06-06 DIAGNOSIS — Z5111 Encounter for antineoplastic chemotherapy: Secondary | ICD-10-CM | POA: Diagnosis not present

## 2018-06-06 MED ORDER — PEGFILGRASTIM-CBQV 6 MG/0.6ML ~~LOC~~ SOSY
PREFILLED_SYRINGE | SUBCUTANEOUS | Status: AC
Start: 2018-06-06 — End: ?
  Filled 2018-06-06: qty 0.6

## 2018-06-06 MED ORDER — PEGFILGRASTIM-CBQV 6 MG/0.6ML ~~LOC~~ SOSY
6.0000 mg | PREFILLED_SYRINGE | Freq: Once | SUBCUTANEOUS | Status: AC
  Administered 2018-06-06: 6 mg via SUBCUTANEOUS

## 2018-06-06 NOTE — Patient Instructions (Signed)
Pegfilgrastim injection What is this medicine? PEGFILGRASTIM (PEG fil gra stim) is a long-acting granulocyte colony-stimulating factor that stimulates the growth of neutrophils, a type of white blood cell important in the body's fight against infection. It is used to reduce the incidence of fever and infection in patients with certain types of cancer who are receiving chemotherapy that affects the bone marrow, and to increase survival after being exposed to high doses of radiation. This medicine may be used for other purposes; ask your health care provider or pharmacist if you have questions. COMMON BRAND NAME(S): Neulasta What should I tell my health care provider before I take this medicine? They need to know if you have any of these conditions: -kidney disease -latex allergy -ongoing radiation therapy -sickle cell disease -skin reactions to acrylic adhesives (On-Body Injector only) -an unusual or allergic reaction to pegfilgrastim, filgrastim, other medicines, foods, dyes, or preservatives -pregnant or trying to get pregnant -breast-feeding How should I use this medicine? This medicine is for injection under the skin. If you get this medicine at home, you will be taught how to prepare and give the pre-filled syringe or how to use the On-body Injector. Refer to the patient Instructions for Use for detailed instructions. Use exactly as directed. Tell your healthcare provider immediately if you suspect that the On-body Injector may not have performed as intended or if you suspect the use of the On-body Injector resulted in a missed or partial dose. It is important that you put your used needles and syringes in a special sharps container. Do not put them in a trash can. If you do not have a sharps container, call your pharmacist or healthcare provider to get one. Talk to your pediatrician regarding the use of this medicine in children. While this drug may be prescribed for selected conditions,  precautions do apply. Overdosage: If you think you have taken too much of this medicine contact a poison control center or emergency room at once. NOTE: This medicine is only for you. Do not share this medicine with others. What if I miss a dose? It is important not to miss your dose. Call your doctor or health care professional if you miss your dose. If you miss a dose due to an On-body Injector failure or leakage, a new dose should be administered as soon as possible using a single prefilled syringe for manual use. What may interact with this medicine? Interactions have not been studied. Give your health care provider a list of all the medicines, herbs, non-prescription drugs, or dietary supplements you use. Also tell them if you smoke, drink alcohol, or use illegal drugs. Some items may interact with your medicine. This list may not describe all possible interactions. Give your health care provider a list of all the medicines, herbs, non-prescription drugs, or dietary supplements you use. Also tell them if you smoke, drink alcohol, or use illegal drugs. Some items may interact with your medicine. What should I watch for while using this medicine? You may need blood work done while you are taking this medicine. If you are going to need a MRI, CT scan, or other procedure, tell your doctor that you are using this medicine (On-Body Injector only). What side effects may I notice from receiving this medicine? Side effects that you should report to your doctor or health care professional as soon as possible: -allergic reactions like skin rash, itching or hives, swelling of the face, lips, or tongue -dizziness -fever -pain, redness, or irritation at site   where injected -pinpoint red spots on the skin -red or dark-brown urine -shortness of breath or breathing problems -stomach or side pain, or pain at the shoulder -swelling -tiredness -trouble passing urine or change in the amount of urine Side  effects that usually do not require medical attention (report to your doctor or health care professional if they continue or are bothersome): -bone pain -muscle pain This list may not describe all possible side effects. Call your doctor for medical advice about side effects. You may report side effects to FDA at 1-800-FDA-1088. Where should I keep my medicine? Keep out of the reach of children. Store pre-filled syringes in a refrigerator between 2 and 8 degrees C (36 and 46 degrees F). Do not freeze. Keep in carton to protect from light. Throw away this medicine if it is left out of the refrigerator for more than 48 hours. Throw away any unused medicine after the expiration date. NOTE: This sheet is a summary. It may not cover all possible information. If you have questions about this medicine, talk to your doctor, pharmacist, or health care provider.  2018 Elsevier/Gold Standard (2016-11-23 12:58:03)  

## 2018-06-11 NOTE — Progress Notes (Signed)
[name]  [phone] [phone]     ID: Anna Conrad DOB: 10-21-1966  MR#: [id number]  RFF#:[id number]  Patient Care Team: Filiberto Pinks as PCP - General (Physician Assistant) Kathleen Tamm, Virgie Dad, MD as Consulting Physician (Oncology) Kyung Rudd, MD as Consulting Physician (Radiation Oncology) [name], MD as Consulting Physician (General Surgery) Janyth Pupa, DO as Consulting Physician (Obstetrics and Gynecology) Janyth Pupa, DO as Consulting Physician (Obstetrics and Gynecology) OTHER MD:  CHIEF COMPLAINT: Estrogen receptor positive breast cancer  CURRENT TREATMENT: Adjuvant chemotherapy  INTERVAL HISTORY: Anna Conrad returns today for follow up and treatment of her estrogen receptor positive breast cancer accompanied by her husband. Today is day 9 cycle 1 of cyclophosphamide and doxorubicin given every 14 days followed by weekly paclitaxel x12. She tolerated this fairly well. She felt disoriented during the infusion which lasted for several days. She felt tired and nauseous, but she denies vomiting. She took compazine and rested often. She had some dysuria after treatment but this was worse after 2-3 days. She denies hematuria or fever. She still has some urinary pain right now. She has not had significant hair loss yet. She is using ice caps next week. She also has a variety of wigs.   She also received Udenyca on day 3. She notes that her shoulders and back hurts when she lays down. This lasted for about 3 days. She took Tylenol and Advil.    REVIEW OF SYSTEMS: Anna Conrad reports that she is done with work for the summer. 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 She denies unusual headaches, visual changes, nausea, vomiting, or dizziness. There has been no unusual cough, phlegm production, or pleurisy. This been no change in bowel or bladder habits. She denies unexplained fatigue or unexplained weight loss, bleeding,  rash, or fever. A detailed review of systems was otherwise stable.     HISTORY OF CURRENT ILLNESS: From the original intake note:  Anna Conrad had routine screening mammography on 01/29/2018 showing a possible mass with associated calcifications in the right breast. She underwent unilateral right diagnostic mammography with tomography and right breast ultrasonography at [facility] on 02/05/2018 showing a highly suspicious mass over the 10:30 position upper outer of the right breast located 6 cm from the nipple measuring 1.3 x 1.4 x 1.5 cm. Associated microcalcifications corresponding to the mammographic abnormality. Ultrasound of the right axilla was normal.  Accordingly on 02/29/2019 she proceeded to biopsy of the right breast area in question. 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 Prognostic indicators significant for: estrogen receptor, 100% positive with strong staining intensity and progesterone receptor, 20% positive iwht moderate staining intensity. Proliferation marker Ki67 at 10%. HER2 not amplified with ratios HER2/CEP17 signals 1.17 and average HER2 copies per cell 2.45.  On 02/21/2018 the patient had bilateral breast MRI.  This measured the upper outer quadrant right breast mass at 2.3 cm including an area of surrounding non-masslike enhancement.  There was no evidence of multifocal or multicentric disease, no lymphadenopathy, and no findings in the left breast.  The patient's subsequent history is as detailed below.    PAST MEDICAL HISTORY: Past Medical History:  Diagnosis Date  . Blood dyscrasia    hx blood clott superficial in rt arm  . Cancer [facility]) 2019   right breast cancer  . Family history of breast cancer   . Heart murmur   . Hypertension   She used to  have migraines. She denies GERD, asthma, emphysema. She was told that she had a slight heart murmur. She denies heart  palpitations.   PAST SURGICAL HISTORY: Past Surgical History:  Procedure Laterality Date  . BREAST LUMPECTOMY WITH RADIOACTIVE SEED AND SENTINEL LYMPH NODE BIOPSY Right 03/18/2018   Procedure: RIGHT BREAST PARTIAL MASTECTOMY WITH RADIOACTIVE SEED AND SENTINEL LYMPH NODE BIOPSY;  Surgeon: [name], MD;  Location: [facility];  Service: General;  Laterality: Right;  . cesearean section     tubal ligation   . PORTACATH PLACEMENT Left 05/27/2018   Procedure: INSERTION PORT-A-CATH;  Surgeon: [name], MD;  Location: [facility];  Service: General;  Laterality: Left;  . TUBAL LIGATION      2 Caesarian sections- tubal ligation at the second c-section.  FAMILY HISTORY Family History  Problem Relation Age of Onset  . Breast cancer Sister 55       approximate  . Cancer Father 59       started in shoulder, spread to spine, brain, lung  . Cancer Paternal [name]        'blood cancer'  . Heart attack Paternal Uncle   . Breast cancer Cousin        age dx unk  . Breast cancer Cousin        age dx unk  . Breast cancer Cousin        age dx unk   The patient's father passed away in the summer 2018 at the age of 74 due to metastatic lung cancer. He was a previous smoker. The patient's mother is alive at age 28. The patient has 1 brother and 1 sister. The patient's sister was diagnosed with breast cancer at the age of 49.  The patient has 3 paternal cousins diagnosed with breast cancer. She denies a family history of ovarian cancer.    GYNECOLOGIC HISTORY:  The patient's periods currently are about twice a month.   Menarche: 52 years old. She used to be on a dance team.  Age at first live birth: 52 years old She is Herbster P2. She was taking oral contraceptives, but this has been discontinued as of January 2019. She had bilateral tubal ligation at the time of her second c-section.    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  She has a PhD.  Her  husband, Anna Conrad, is a Chief Strategy Officer for [employer], making metal parts. He travels about 2-3 times per year. Their sons are Anna Conrad age 12, and Anna Conrad age 60. She attends [employer].     ADVANCED DIRECTIVES:    HEALTH MAINTENANCE: Social History   Tobacco Use  . Smoking status: Never Smoker  . Smokeless tobacco: Never Used  Substance Use Topics  . Alcohol use: Yes    Comment: occ  . Drug use: Never     Colonoscopy:n/a  PAP: 2017/ normal  Bone density: n/a   Allergies  Allergen Reactions  . Latex Rash    Red  rash    Current Outpatient Medications  Medication Sig Dispense Refill  . acetaminophen (TYLENOL) 500 MG tablet Take 1,000 mg by mouth every 6 (six) hours as needed (for pain.).    Marland Kitchen dexamethasone (DECADRON) 4 MG tablet Take 2 tablets by mouth once a day on the day after chemotherapy and then take 2 tablets two times a day for 2 days. Take with food. 30 tablet 1  . ibuprofen (ADVIL,MOTRIN) 200 MG tablet Take  400 mg by mouth every 8 (eight) hours as needed (for pain.).    Marland Kitchen lidocaine-prilocaine (EMLA) cream Apply to affected area once 30 g 3  . lisinopril-hydrochlorothiazide (PRINZIDE,ZESTORETIC) 10-12.5 MG tablet Take 1 tablet by mouth daily.    Marland Kitchen LORazepam (ATIVAN) 0.5 MG tablet Take 1 tablet (0.5 mg total) by mouth at bedtime as needed (Nausea or vomiting). 30 tablet 0  . oxyCODONE (OXY IR/ROXICODONE) 5 MG immediate release tablet Take 1 tablet (5 mg total) by mouth every 6 (six) hours as needed for moderate pain or severe pain. 15 tablet 0  . prochlorperazine (COMPAZINE) 10 MG tablet Take 1 tablet (10 mg total) by mouth every 6 (six) hours as needed (Nausea or vomiting). 30 tablet 1   No current facility-administered medications for this visit.     OBJECTIVE: Middle-aged white woman who appears well  Vitals:   06/12/18 1429  BP: 112/72  Pulse: (!) 104  Resp: 18  Temp: 98.7 F (37.1 C)  SpO2: 99%     Body mass index is 32.87 kg/m.     Wt Readings from Last 3 Encounters:  06/12/18 191 lb 8 oz (86.9 kg)  05/28/18 193 lb 4.8 oz (87.7 kg)  05/27/18 196 lb (88.9 kg)      ECOG FS:1 - Symptomatic but completely ambulatory  Sclerae unicteric, EOMs intact Oropharynx clear and moist No cervical or supraclavicular adenopathy Lungs no rales or rhonchi Heart regular rate and rhythm Abd soft, nontender, positive bowel sounds MSK no focal spinal tenderness, no upper extremity lymphedema Neuro: nonfocal, well oriented, appropriate affect Breasts: Right breast is status post lumpectomy.  There are no findings of concern.  Left breast is unremarkable.  Both axillae are benign.   LAB RESULTS:  CMP     Component Value Date/Time   NA 138 06/04/2018 0810   K 3.6 06/04/2018 0810   CL 104 06/04/2018 0810   CO2 26 06/04/2018 0810   GLUCOSE 87 06/04/2018 0810   BUN 14 06/04/2018 0810   CREATININE 0.87 06/04/2018 0810   CALCIUM 9.1 06/04/2018 0810   PROT 6.7 06/04/2018 0810   ALBUMIN 3.9 06/04/2018 0810   AST 19 06/04/2018 0810   ALT 27 06/04/2018 0810   ALKPHOS 55 06/04/2018 0810   BILITOT 0.5 06/04/2018 0810   GFRNONAA >60 06/04/2018 0810   GFRAA >60 06/04/2018 0810    No results found for: TOTALPROTELP, ALBUMINELP, A1GS, A2GS, BETS, BETA2SER, GAMS, MSPIKE, SPEI  No results found for: KPAFRELGTCHN, LAMBDASER, KAPLAMBRATIO  Lab Results  Component Value Date   WBC 4.1 06/12/2018   NEUTROABS 1.6 06/12/2018   HGB 12.3 06/12/2018   HCT 36.4 06/12/2018   MCV 93.1 06/12/2018   PLT 189 06/12/2018    @LASTCHEMISTRY @  No results found for: LABCA2  No components found for: UYQIHK742  No results for input(s): INR in the last 168 hours.  No results found for: LABCA2  No results found for: VZD638  No results found for: VFI433  No results found for: IRJ188  No results found for: CA2729  No components found for: HGQUANT  No results found for: CEA1 / No results found for: CEA1   No results found for:  AFPTUMOR  No results found for: CHROMOGRNA  No results found for: PSA1  Appointment on 06/12/2018  Component Date Value Ref Range Status  . WBC 06/12/2018 4.1  3.9 - 10.3 K/uL Final  . RBC 06/12/2018 3.91  3.70 - 5.45 MIL/uL Final  . Hemoglobin 06/12/2018 12.3  11.6 -  15.9 g/dL Final  . HCT 06/12/2018 36.4  34.8 - 46.6 % Final  . MCV 06/12/2018 93.1  79.5 - 101.0 fL Final  . MCH 06/12/2018 31.4  25.1 - 34.0 pg Final  . MCHC 06/12/2018 33.8  31.5 - 36.0 g/dL Final  . RDW 06/12/2018 12.6  11.2 - 14.5 % Final  . Platelets 06/12/2018 189  145 - 400 K/uL Final  . Neutrophils Relative % 06/12/2018 38  % Final  . Neutro Abs 06/12/2018 1.6  1.5 - 6.5 K/uL Final  . Lymphocytes Relative 06/12/2018 36  % Final  . Lymphs Abs 06/12/2018 1.5  0.9 - 3.3 K/uL Final  . Monocytes Relative 06/12/2018 12  % Final  . Monocytes Absolute 06/12/2018 0.5  0.1 - 0.9 K/uL Final  . Eosinophils Relative 06/12/2018 12  % Final  . Eosinophils Absolute 06/12/2018 0.5  0.0 - 0.5 K/uL Final  . Basophils Relative 06/12/2018 2  % Final  . Basophils Absolute 06/12/2018 0.1  0.0 - 0.1 K/uL Final   Performed at [facility], [place] [address] [place], [place] [address]    (this displays the last labs from the last 3 days)  No results found for: TOTALPROTELP, ALBUMINELP, A1GS, A2GS, BETS, BETA2SER, GAMS, MSPIKE, SPEI (this displays SPEP labs)  No results found for: KPAFRELGTCHN, LAMBDASER, KAPLAMBRATIO (kappa/lambda light chains)  No results found for: HGBA, HGBA2QUANT, HGBFQUANT, HGBSQUAN (Hemoglobinopathy evaluation)   No results found for: LDH  No results found for: IRON, TIBC, IRONPCTSAT (Iron and TIBC)  No results found for: FERRITIN  Urinalysis No results found for: COLORURINE, APPEARANCEUR, LABSPEC, PHURINE, GLUCOSEU, HGBUR, BILIRUBINUR, KETONESUR, PROTEINUR, UROBILINOGEN, NITRITE, LEUKOCYTESUR   STUDIES: Dg Chest Port 1 View  Result Date: 05/27/2018 CLINICAL DATA:   Port-A-Cath placement EXAM: PORTABLE CHEST 1 VIEW COMPARISON:  None. FINDINGS: Port-A-Cath tip is in the superior vena cava. No pneumothorax. There is no edema or consolidation. Heart is borderline prominent with pulmonary vascularity normal. No adenopathy. No bone lesions. IMPRESSION: Port-A-Cath tip in the superior vena cava. No pneumothorax. No edema or consolidation. Heart borderline prominent with pulmonary vascularity normal. Electronically Signed   By: Lowella Grip III M.D.   On: 05/27/2018 09:00   Dg Fluoro Guide Cv Line-no Report  Result Date: 05/27/2018 Fluoroscopy was utilized by the requesting physician.  No radiographic interpretation.    ELIGIBLE FOR AVAILABLE RESEARCH PROTOCOL: BCEP   ASSESSMENT: 52 y.o. [place], [place] woman status post right breast upper outer quadrant biopsy 02/07/2018 for a clinical T1c-T2 N0, stage IA-B invasive ductal carcinoma, grade 2, estrogen receptor and progesterone receptor positive, HER-2 not amplified, with an MIB-1 of 10%  (1) status post right lumpectomy 03/18/2018 for a pT1c pN0, stage IA invasive ductal carcinoma, grade 2, estrogen and progesterone receptor positive, with negative margins.  A total of 1 lymph node was removed  (2) Mammaprint on 03/18/2018 showed high risk, indicating that with chemotherapy and hormone therapy, the patient would have a nearly 95% chance of having no distant metastases within the next 5 years  (3) chemotherapy will consist of doxorubicin and cyclophosphamide in dose dense fashion x4 starting 06/04/2018, to be followed by weekly paclitaxel x12  (a) baseline echocardiogram on 05/23/2018 showed an ejection fraction in the 65-75% range  (4) adjuvant radiation to follow  (5) antiestrogens to follow at the completion of local treatment  (6) genetics testing 03/13/2018 through the Common Hereditary Cancer Panel offered by [facility] ifound no deleterious mutations in APC, ATM, AXIN2, BARD1, BMPR1A, BRCA1,  BRCA2, BRIP1,  CDH1, CDKN2A (p14ARF), CDKN2A (p16INK4a), CKD4, CHEK2, CTNNA1, DICER1, EPCAM (Deletion/duplication testing only), GREM1 (promoter region deletion/duplication testing only), KIT, MEN1, MLH1, MSH2, MSH3, MSH6, MUTYH, NBN, NF1, NHTL1, PALB2, PDGFRA, PMS2, POLD1, POLE, PTEN, RAD50, RAD51C, RAD51D, SDHB, SDHC, SDHD, SMAD4, SMARCA4. STK11, TP53, TSC1, TSC2, and VHL.  The following genes were evaluated for sequence changes only: SDHA and HOXB13 c.251G>A variant only.  (a)  A variant of uncertain significance (VUS) in the gene MSH3 was also identified c.1027+4T>C (Intronic).    PLAN: Arrie did generally quite well with her first cycle of chemotherapy.  She may be beginning to develop associated nausea and I am adding Ativan to her premeds to try to prevent that.  She understands she will need to have someone drive her home each time.  She has some dysuria.  It is not clear whether that is related to the chemo or an infection but we are going to obtain a urinalysis and urine culture today.  Her second cycle is being delayed a week because she has a conference all of next week in [place].  I asked her to make sure to note how she feels on July 9, which is when she would normally have received cycle 2.  If she felt that she could have received at that day then after her July 16 treatment the next treatment will be 2 weeks later not 3.  She has not yet begun to lose her hair but very likely will lose it before the next cycle although she is using an ice Which she obtained through [facility] at a reasonable cost.  She knows to call for any problems that may develop before the next visit.    Lothar Prehn, Virgie Dad, MD  06/12/18 2:49 PM Medical Oncology and Hematology [facility] [address] [place], [place] [address] Tel. [phone]    Fax. [phone]  Alice Rieger, am acting as scribe for Chauncey Cruel MD.  I, Lurline Del MD, have reviewed the above documentation for  accuracy and completeness, and I agree with the above.

## 2018-06-12 ENCOUNTER — Telehealth: Payer: Self-pay | Admitting: Oncology

## 2018-06-12 ENCOUNTER — Inpatient Hospital Stay (HOSPITAL_BASED_OUTPATIENT_CLINIC_OR_DEPARTMENT_OTHER): Payer: BC Managed Care – PPO | Admitting: Oncology

## 2018-06-12 ENCOUNTER — Inpatient Hospital Stay: Payer: BC Managed Care – PPO

## 2018-06-12 ENCOUNTER — Inpatient Hospital Stay: Payer: BC Managed Care – PPO | Attending: Oncology

## 2018-06-12 ENCOUNTER — Other Ambulatory Visit: Payer: BC Managed Care – PPO

## 2018-06-12 VITALS — BP 112/72 | HR 104 | Temp 98.7°F | Resp 18 | Ht 64.0 in | Wt 191.5 lb

## 2018-06-12 DIAGNOSIS — Z5111 Encounter for antineoplastic chemotherapy: Secondary | ICD-10-CM | POA: Insufficient documentation

## 2018-06-12 DIAGNOSIS — C50411 Malignant neoplasm of upper-outer quadrant of right female breast: Secondary | ICD-10-CM

## 2018-06-12 DIAGNOSIS — Z95828 Presence of other vascular implants and grafts: Secondary | ICD-10-CM

## 2018-06-12 DIAGNOSIS — R300 Dysuria: Secondary | ICD-10-CM | POA: Diagnosis not present

## 2018-06-12 DIAGNOSIS — Z170 Estrogen receptor positive status [ER+]: Secondary | ICD-10-CM

## 2018-06-12 DIAGNOSIS — Z5189 Encounter for other specified aftercare: Secondary | ICD-10-CM | POA: Diagnosis not present

## 2018-06-12 LAB — CBC WITH DIFFERENTIAL/PLATELET
BASOS ABS: 0.1 10*3/uL (ref 0.0–0.1)
BASOS PCT: 2 %
EOS ABS: 0.5 10*3/uL (ref 0.0–0.5)
EOS PCT: 12 %
HCT: 36.4 % (ref 34.8–46.6)
Hemoglobin: 12.3 g/dL (ref 11.6–15.9)
Lymphocytes Relative: 36 %
Lymphs Abs: 1.5 10*3/uL (ref 0.9–3.3)
MCH: 31.4 pg (ref 25.1–34.0)
MCHC: 33.8 g/dL (ref 31.5–36.0)
MCV: 93.1 fL (ref 79.5–101.0)
Monocytes Absolute: 0.5 10*3/uL (ref 0.1–0.9)
Monocytes Relative: 12 %
Neutro Abs: 1.6 10*3/uL (ref 1.5–6.5)
Neutrophils Relative %: 38 %
PLATELETS: 189 10*3/uL (ref 145–400)
RBC: 3.91 MIL/uL (ref 3.70–5.45)
RDW: 12.6 % (ref 11.2–14.5)
WBC: 4.1 10*3/uL (ref 3.9–10.3)

## 2018-06-12 LAB — URINALYSIS, COMPLETE (UACMP) WITH MICROSCOPIC
BILIRUBIN URINE: NEGATIVE
Bacteria, UA: NONE SEEN
Glucose, UA: NEGATIVE mg/dL
HGB URINE DIPSTICK: NEGATIVE
KETONES UR: NEGATIVE mg/dL
LEUKOCYTES UA: NEGATIVE
NITRITE: NEGATIVE
Protein, ur: NEGATIVE mg/dL
SPECIFIC GRAVITY, URINE: 1.02 (ref 1.005–1.030)
pH: 7 (ref 5.0–8.0)

## 2018-06-12 LAB — COMPREHENSIVE METABOLIC PANEL
ALK PHOS: 80 U/L (ref 38–126)
ALT: 20 U/L (ref 0–44)
AST: 12 U/L — ABNORMAL LOW (ref 15–41)
Albumin: 4 g/dL (ref 3.5–5.0)
Anion gap: 5 (ref 5–15)
BUN: 16 mg/dL (ref 6–20)
CALCIUM: 9.4 mg/dL (ref 8.9–10.3)
CO2: 32 mmol/L (ref 22–32)
CREATININE: 0.96 mg/dL (ref 0.44–1.00)
Chloride: 103 mmol/L (ref 98–111)
GFR calc Af Amer: >60 mL/min (ref >60)
Glucose, Bld: 96 mg/dL (ref 70–99)
POTASSIUM: 4.1 mmol/L (ref 3.5–5.1)
Sodium: 140 mmol/L (ref 135–145)
TOTAL PROTEIN: 6.8 g/dL (ref 6.5–8.1)

## 2018-06-12 MED ORDER — SODIUM CHLORIDE 0.9% FLUSH
10.0000 mL | INTRAVENOUS | Status: DC | PRN
  Filled 2018-06-12: qty 10

## 2018-06-12 MED ORDER — HEPARIN SOD (PORK) LOCK FLUSH 100 UNIT/ML IV SOLN
500.0000 [IU] | Freq: Once | INTRAVENOUS | Status: DC | PRN
  Filled 2018-06-12: qty 5

## 2018-06-12 NOTE — Progress Notes (Signed)
Pt. arrived for port flush, but only needed labs. Pt. preferred to have lab stick instead of port. Lab called and accepted pt. sent to lab.

## 2018-06-12 NOTE — Telephone Encounter (Signed)
Gave patient avs and calendar of upcoming July appts.  °

## 2018-06-13 LAB — URINE CULTURE

## 2018-06-18 ENCOUNTER — Other Ambulatory Visit: Payer: BC Managed Care – PPO

## 2018-06-18 ENCOUNTER — Ambulatory Visit: Payer: BC Managed Care – PPO

## 2018-06-19 ENCOUNTER — Other Ambulatory Visit: Payer: Self-pay

## 2018-06-19 DIAGNOSIS — C50411 Malignant neoplasm of upper-outer quadrant of right female breast: Secondary | ICD-10-CM

## 2018-06-19 DIAGNOSIS — R300 Dysuria: Secondary | ICD-10-CM

## 2018-06-19 DIAGNOSIS — Z170 Estrogen receptor positive status [ER+]: Principal | ICD-10-CM

## 2018-06-19 MED ORDER — FLUCONAZOLE 100 MG PO TABS: 100.0000 mg | ORAL_TABLET | Freq: Every day | ORAL | 0 refills | Status: DC

## 2018-06-19 MED ORDER — VALACYCLOVIR HCL 1 G PO TABS: 1000.0000 mg | ORAL_TABLET | Freq: Every day | ORAL | 0 refills | Status: DC

## 2018-06-19 NOTE — Progress Notes (Signed)
Pt called inquiring about her recent UA screening and culture. Pt states that her symptoms have resolved and is no longer having trouble with burning with urination.  Pt is now reporting tiny blisters in her vaginal area that is non draining, that have been present for 3-4 days now. Pt denies fever, n/v, hematuria, swelling or unusual odor at this time.   She is coming back for cycle 2 AC next Tuesday and would like to know if she will need to be treated for her vaginal blisters. Pt does not feel its urgent and is managing symptoms at home. Discussed the need for symptoms management with patient and notified Dr.Magrinat. Obtained order for diflucan and valtrex at this time for pt to take at home. Pt will have repeat labs and Urine test next week since her final urine culture was possibly contaminated.   Advised pt to call if she has any worsening of symptoms and we can set her up an appt with SM provider. Pt verbalized understanding and confirmed time/date of appt next Tuesday for her next cycle 2 of chemo.

## 2018-06-21 ENCOUNTER — Telehealth: Payer: Self-pay | Admitting: Adult Health

## 2018-06-21 NOTE — Telephone Encounter (Signed)
Called regarding 7/18

## 2018-06-25 ENCOUNTER — Inpatient Hospital Stay: Payer: BC Managed Care – PPO

## 2018-06-25 ENCOUNTER — Other Ambulatory Visit: Payer: Self-pay | Admitting: Oncology

## 2018-06-25 VITALS — BP 95/72 | HR 73 | Temp 98.8°F | Resp 17

## 2018-06-25 DIAGNOSIS — R300 Dysuria: Secondary | ICD-10-CM

## 2018-06-25 DIAGNOSIS — C50411 Malignant neoplasm of upper-outer quadrant of right female breast: Secondary | ICD-10-CM

## 2018-06-25 DIAGNOSIS — Z95828 Presence of other vascular implants and grafts: Secondary | ICD-10-CM

## 2018-06-25 DIAGNOSIS — Z170 Estrogen receptor positive status [ER+]: Principal | ICD-10-CM

## 2018-06-25 DIAGNOSIS — Z5111 Encounter for antineoplastic chemotherapy: Secondary | ICD-10-CM | POA: Diagnosis not present

## 2018-06-25 LAB — CBC WITH DIFFERENTIAL (CANCER CENTER ONLY)
BASOS ABS: 0 10*3/uL (ref 0.0–0.1)
Basophils Relative: 1 %
EOS PCT: 1 %
Eosinophils Absolute: 0 10*3/uL (ref 0.0–0.5)
HCT: 35.1 % (ref 34.8–46.6)
Hemoglobin: 12.1 g/dL (ref 11.6–15.9)
LYMPHS ABS: 1.3 10*3/uL (ref 0.9–3.3)
LYMPHS PCT: 22 %
MCH: 32.1 pg (ref 25.1–34.0)
MCHC: 34.6 g/dL (ref 31.5–36.0)
MCV: 92.9 fL (ref 79.5–101.0)
Monocytes Absolute: 0.8 10*3/uL (ref 0.1–0.9)
Monocytes Relative: 14 %
Neutro Abs: 3.8 10*3/uL (ref 1.5–6.5)
Neutrophils Relative %: 62 %
PLATELETS: 386 10*3/uL (ref 145–400)
RBC: 3.78 MIL/uL (ref 3.70–5.45)
RDW: 13 % (ref 11.2–14.5)
WBC: 5.9 10*3/uL (ref 3.9–10.3)

## 2018-06-25 LAB — URINALYSIS, COMPLETE (UACMP) WITH MICROSCOPIC
Bilirubin Urine: NEGATIVE
GLUCOSE, UA: NEGATIVE mg/dL
HGB URINE DIPSTICK: NEGATIVE
KETONES UR: NEGATIVE mg/dL
Leukocytes, UA: NEGATIVE
Nitrite: NEGATIVE
PROTEIN: NEGATIVE mg/dL
Specific Gravity, Urine: 1.019 (ref 1.005–1.030)
pH: 6 (ref 5.0–8.0)

## 2018-06-25 LAB — CMP (CANCER CENTER ONLY)
ALK PHOS: 66 U/L (ref 38–126)
ALT: 20 U/L (ref 0–44)
AST: 16 U/L (ref 15–41)
Albumin: 4 g/dL (ref 3.5–5.0)
Anion gap: 9 (ref 5–15)
BILIRUBIN TOTAL: 0.4 mg/dL (ref 0.3–1.2)
BUN: 17 mg/dL (ref 6–20)
CO2: 27 mmol/L (ref 22–32)
CREATININE: 0.96 mg/dL (ref 0.44–1.00)
Calcium: 9.2 mg/dL (ref 8.9–10.3)
Chloride: 101 mmol/L (ref 98–111)
GFR, Est AFR Am: >60 mL/min (ref >60)
Glucose, Bld: 93 mg/dL (ref 70–99)
POTASSIUM: 3.9 mmol/L (ref 3.5–5.1)
Sodium: 137 mmol/L (ref 135–145)
TOTAL PROTEIN: 6.9 g/dL (ref 6.5–8.1)

## 2018-06-25 MED ORDER — SODIUM CHLORIDE 0.9% FLUSH
10.0000 mL | INTRAVENOUS | Status: DC | PRN
  Administered 2018-06-25: 10 mL
  Filled 2018-06-25: qty 10

## 2018-06-25 MED ORDER — SODIUM CHLORIDE 0.9 % IV SOLN
Freq: Once | INTRAVENOUS | Status: AC
  Administered 2018-06-25: 10:00:00 via INTRAVENOUS
  Filled 2018-06-25: qty 5

## 2018-06-25 MED ORDER — HEPARIN SOD (PORK) LOCK FLUSH 100 UNIT/ML IV SOLN
500.0000 [IU] | Freq: Once | INTRAVENOUS | Status: AC | PRN
  Administered 2018-06-25: 500 [IU]
  Filled 2018-06-25: qty 5

## 2018-06-25 MED ORDER — DOXORUBICIN HCL CHEMO IV INJECTION 2 MG/ML
60.0000 mg/m2 | Freq: Once | INTRAVENOUS | Status: AC
  Administered 2018-06-25: 118 mg via INTRAVENOUS
  Filled 2018-06-25: qty 59

## 2018-06-25 MED ORDER — CYCLOPHOSPHAMIDE CHEMO INJECTION 1 GM
600.0000 mg/m2 | Freq: Once | INTRAMUSCULAR | Status: AC
  Administered 2018-06-25: 1180 mg via INTRAVENOUS
  Filled 2018-06-25: qty 59

## 2018-06-25 MED ORDER — LORAZEPAM 2 MG/ML IJ SOLN
0.5000 mg | Freq: Once | INTRAMUSCULAR | Status: AC
  Administered 2018-06-25: 0.5 mg via INTRAVENOUS

## 2018-06-25 MED ORDER — PALONOSETRON HCL INJECTION 0.25 MG/5ML
0.2500 mg | Freq: Once | INTRAVENOUS | Status: AC
Start: 2018-06-25 — End: 2018-06-25
  Administered 2018-06-25: 0.25 mg via INTRAVENOUS

## 2018-06-25 MED ORDER — LORAZEPAM 2 MG/ML IJ SOLN
INTRAMUSCULAR | Status: AC
  Filled 2018-06-25: qty 1

## 2018-06-25 MED ORDER — PALONOSETRON HCL INJECTION 0.25 MG/5ML
INTRAVENOUS | Status: AC
  Filled 2018-06-25: qty 5

## 2018-06-25 MED ORDER — SODIUM CHLORIDE 0.9 % IV SOLN
Freq: Once | INTRAVENOUS | Status: AC
Start: 2018-06-25 — End: 2018-06-25
  Administered 2018-06-25: 10:00:00 via INTRAVENOUS

## 2018-06-25 NOTE — Patient Instructions (Signed)
[facility] Discharge Instructions for Patients Receiving Chemotherapy  Today you received the following chemotherapy agents: Adriamycin, Cytoxan  To help prevent nausea and vomiting after your treatment, we encourage you to take your nausea medication as directed.   If you develop nausea and vomiting that is not controlled by your nausea medication, call the clinic.   BELOW ARE SYMPTOMS THAT SHOULD BE REPORTED IMMEDIATELY:  *FEVER GREATER THAN 100.5 F  *CHILLS WITH OR WITHOUT FEVER  NAUSEA AND VOMITING THAT IS NOT CONTROLLED WITH YOUR NAUSEA MEDICATION  *UNUSUAL SHORTNESS OF BREATH  *UNUSUAL BRUISING OR BLEEDING  TENDERNESS IN MOUTH AND THROAT WITH OR WITHOUT PRESENCE OF ULCERS  *URINARY PROBLEMS  *BOWEL PROBLEMS  UNUSUAL RASH Items with * indicate a potential emergency and should be followed up as soon as possible.  Feel free to call the clinic should you have any questions or concerns. The clinic phone number is [phone].  Please show the CHEMO ALERT CARD at check-in to the Emergency Department and triage nurse.   

## 2018-06-26 LAB — URINE CULTURE

## 2018-06-27 ENCOUNTER — Inpatient Hospital Stay: Payer: BC Managed Care – PPO

## 2018-06-27 VITALS — BP 108/68 | HR 86 | Temp 98.1°F | Resp 18

## 2018-06-27 DIAGNOSIS — Z170 Estrogen receptor positive status [ER+]: Principal | ICD-10-CM

## 2018-06-27 DIAGNOSIS — C50411 Malignant neoplasm of upper-outer quadrant of right female breast: Secondary | ICD-10-CM

## 2018-06-27 DIAGNOSIS — Z5111 Encounter for antineoplastic chemotherapy: Secondary | ICD-10-CM | POA: Diagnosis not present

## 2018-06-27 MED ORDER — PEGFILGRASTIM-CBQV 6 MG/0.6ML ~~LOC~~ SOSY
6.0000 mg | PREFILLED_SYRINGE | Freq: Once | SUBCUTANEOUS | Status: AC
  Administered 2018-06-27: 6 mg via SUBCUTANEOUS

## 2018-06-27 MED ORDER — PEGFILGRASTIM-CBQV 6 MG/0.6ML ~~LOC~~ SOSY
PREFILLED_SYRINGE | SUBCUTANEOUS | Status: AC
Start: 2018-06-27 — End: ?
  Filled 2018-06-27: qty 0.6

## 2018-07-02 ENCOUNTER — Other Ambulatory Visit: Payer: BC Managed Care – PPO

## 2018-07-02 ENCOUNTER — Ambulatory Visit: Payer: BC Managed Care – PPO

## 2018-07-03 ENCOUNTER — Telehealth: Payer: Self-pay | Admitting: Adult Health

## 2018-07-03 ENCOUNTER — Encounter: Payer: Self-pay | Admitting: Adult Health

## 2018-07-03 ENCOUNTER — Inpatient Hospital Stay (HOSPITAL_BASED_OUTPATIENT_CLINIC_OR_DEPARTMENT_OTHER): Payer: BC Managed Care – PPO | Admitting: Adult Health

## 2018-07-03 ENCOUNTER — Telehealth: Payer: Self-pay | Admitting: Oncology

## 2018-07-03 ENCOUNTER — Inpatient Hospital Stay: Payer: BC Managed Care – PPO

## 2018-07-03 VITALS — BP 107/75 | HR 79 | Temp 98.4°F | Resp 19 | Ht 64.0 in | Wt 190.1 lb

## 2018-07-03 DIAGNOSIS — Z170 Estrogen receptor positive status [ER+]: Secondary | ICD-10-CM | POA: Diagnosis not present

## 2018-07-03 DIAGNOSIS — C50411 Malignant neoplasm of upper-outer quadrant of right female breast: Secondary | ICD-10-CM

## 2018-07-03 DIAGNOSIS — Z5111 Encounter for antineoplastic chemotherapy: Secondary | ICD-10-CM | POA: Diagnosis not present

## 2018-07-03 LAB — CBC WITH DIFFERENTIAL (CANCER CENTER ONLY)
BASOS ABS: 0.1 10*3/uL (ref 0.0–0.1)
BASOS PCT: 1 %
Eosinophils Absolute: 0.1 10*3/uL (ref 0.0–0.5)
Eosinophils Relative: 3 %
HCT: 31.8 % — ABNORMAL LOW (ref 34.8–46.6)
Hemoglobin: 10.8 g/dL — ABNORMAL LOW (ref 11.6–15.9)
Lymphocytes Relative: 35 %
Lymphs Abs: 1.5 10*3/uL (ref 0.9–3.3)
MCH: 31.6 pg (ref 25.1–34.0)
MCHC: 34 g/dL (ref 31.5–36.0)
MCV: 93 fL (ref 79.5–101.0)
MONOS PCT: 19 %
Monocytes Absolute: 0.8 10*3/uL (ref 0.1–0.9)
Neutro Abs: 1.8 10*3/uL (ref 1.5–6.5)
Neutrophils Relative %: 42 %
Platelet Count: 168 10*3/uL (ref 145–400)
RBC: 3.42 MIL/uL — ABNORMAL LOW (ref 3.70–5.45)
RDW: 13.2 % (ref 11.2–14.5)
WBC Count: 4.3 10*3/uL (ref 3.9–10.3)

## 2018-07-03 LAB — CMP (CANCER CENTER ONLY)
ALK PHOS: 77 U/L (ref 38–126)
ALT: 14 U/L (ref 0–44)
ANION GAP: 8 (ref 5–15)
AST: 10 U/L — ABNORMAL LOW (ref 15–41)
Albumin: 3.8 g/dL (ref 3.5–5.0)
BUN: 19 mg/dL (ref 6–20)
CO2: 27 mmol/L (ref 22–32)
Calcium: 8.9 mg/dL (ref 8.9–10.3)
Chloride: 101 mmol/L (ref 98–111)
Creatinine: 0.89 mg/dL (ref 0.44–1.00)
Glucose, Bld: 86 mg/dL (ref 70–99)
Potassium: 3.6 mmol/L (ref 3.5–5.1)
SODIUM: 136 mmol/L (ref 135–145)
TOTAL PROTEIN: 6.5 g/dL (ref 6.5–8.1)
Total Bilirubin: 0.3 mg/dL (ref 0.3–1.2)

## 2018-07-03 NOTE — Progress Notes (Signed)
[place]  [phone] [phone]     ID: Anna Conrad DOB: 1966/05/14  MR#: [id number]  RTM#:[id number]  Patient Care Team: Filiberto Pinks as PCP - General (Physician Assistant) Magrinat, Virgie Dad, MD as Consulting Physician (Oncology) Kyung Rudd, MD as Consulting Physician (Radiation Oncology) [name], MD as Consulting Physician (General Surgery) Janyth Pupa, DO as Consulting Physician (Obstetrics and Gynecology) Janyth Pupa, DO as Consulting Physician (Obstetrics and Gynecology) OTHER MD:  CHIEF COMPLAINT: Estrogen receptor positive breast cancer  CURRENT TREATMENT: Adjuvant chemotherapy  INTERVAL HISTORY: Anna Conrad returns today for follow up and treatment of her estrogen receptor positive breast cancer accompanied by her husband. Today is day 9 cycle 2 of cyclophosphamide and doxorubicin given every 14 days followed by weekly paclitaxel x12.   REVIEW OF SYSTEMS: Anna Conrad tolerated her treatment pretty well.  She did receive Lorazepam prior to her last treatment.  She says that this medication made her much more tired and sluggish in the day following her treatment which in turn resulted in her not drinking as much.  She does not want to receive this with her next treatment.  She notes a slight increase in fatigue since her last treatment, along with losing her hair and scalp tenderness.    Anna Conrad denies fevers, vomiting, constipation, diarrhea, mucositis.  She is feeling much improved today.    HISTORY OF CURRENT ILLNESS: From the original intake note:  Anna Conrad had routine screening mammography on 01/29/2018 showing a possible mass with associated calcifications in the right breast. She underwent unilateral right diagnostic mammography with tomography and right breast ultrasonography at [facility] on 02/05/2018 showing a highly suspicious mass over the 10:30 position upper outer of the right breast  located 6 cm from the nipple measuring 1.3 x 1.4 x 1.5 cm. Associated microcalcifications corresponding to the mammographic abnormality. Ultrasound of the right axilla was normal.  Accordingly on 02/29/2019 she proceeded to biopsy of the right breast area in question. 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 Prognostic indicators significant for: estrogen receptor, 100% positive with strong staining intensity and progesterone receptor, 20% positive iwht moderate staining intensity. Proliferation marker Ki67 at 10%. HER2 not amplified with ratios HER2/CEP17 signals 1.17 and average HER2 copies per cell 2.45.  On 02/21/2018 the patient had bilateral breast MRI.  This measured the upper outer quadrant right breast mass at 2.3 cm including an area of surrounding non-masslike enhancement.  There was no evidence of multifocal or multicentric disease, no lymphadenopathy, and no findings in the left breast.  The patient's subsequent history is as detailed below.    PAST MEDICAL HISTORY: Past Medical History:  Diagnosis Date  . Blood dyscrasia    hx blood clott superficial in rt arm  . Cancer [facility]) 2019   right breast cancer  . Family history of breast cancer   . Heart murmur   . Hypertension   She used to have migraines. She denies GERD, asthma, emphysema. She was told that she had a slight heart murmur. She denies heart palpitations.   PAST SURGICAL HISTORY: Past Surgical History:  Procedure Laterality Date  . BREAST LUMPECTOMY WITH RADIOACTIVE SEED AND SENTINEL LYMPH NODE BIOPSY Right 03/18/2018   Procedure: RIGHT BREAST PARTIAL MASTECTOMY WITH RADIOACTIVE SEED AND SENTINEL LYMPH NODE BIOPSY;  Surgeon: [name], MD;  Location: [facility];  Service: General;  Laterality: Right;  . cesearean section     tubal  ligation   . PORTACATH PLACEMENT Left 05/27/2018   Procedure: INSERTION PORT-A-CATH;  Surgeon: [name], MD;  Location: [place];  Service: General;  Laterality: Left;  . TUBAL LIGATION      2 Caesarian sections- tubal ligation at the second c-section.  FAMILY HISTORY Family History  Problem Relation Age of Onset  . Breast cancer Sister 55       approximate  . Cancer Father 72       started in shoulder, spread to spine, brain, lung  . Cancer Paternal [name]        'blood cancer'  . Heart attack Paternal Uncle   . Breast cancer Cousin        age dx unk  . Breast cancer Cousin        age dx unk  . Breast cancer Cousin        age dx unk   The patient's father passed away in the summer 2018 at the age of 56 due to metastatic lung cancer. He was a previous smoker. The patient's mother is alive at age 93. The patient has 1 brother and 1 sister. The patient's sister was diagnosed with breast cancer at the age of 67.  The patient has 3 paternal cousins diagnosed with breast cancer. She denies a family history of ovarian cancer.    GYNECOLOGIC HISTORY:  The patient's periods currently are about twice a month.   Menarche: 52 years old. She used to be on a dance team.  Age at first live birth: 52 years old She is [place] P2. She was taking oral contraceptives, but this has been discontinued as of January 2019. She had bilateral tubal ligation at the time of her second c-section.    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  She has a PhD.  Her husband, Anna Conrad, is a Chief Strategy Officer for [employer], making metal parts. He travels about 2-3 times per year. Their sons are Anna Conrad age 24, and Anna Conrad age 20. She attends [employer].     ADVANCED DIRECTIVES:    HEALTH MAINTENANCE: Social History   Tobacco Use  . Smoking status: Never Smoker  . Smokeless tobacco: Never Used  Substance Use Topics  . Alcohol use: Yes    Comment: occ  . Drug use: Never     Colonoscopy:n/a  PAP: 2017/ normal  Bone density:  n/a   Allergies  Allergen Reactions  . Latex Rash    Red  rash    Current Outpatient Medications  Medication Sig Dispense Refill  . acetaminophen (TYLENOL) 500 MG tablet Take 1,000 mg by mouth every 6 (six) hours as needed (for pain.).    Marland Kitchen dexamethasone (DECADRON) 4 MG tablet Take 2 tablets by mouth once a day on the day after chemotherapy and then take 2 tablets two times a day for 2 days. Take with food. 30 tablet 1  . ibuprofen (ADVIL,MOTRIN) 200 MG tablet Take 400 mg by mouth every 8 (eight) hours as needed (for pain.).    Marland Kitchen lidocaine-prilocaine (EMLA) cream Apply to affected area once 30 g 3  . lisinopril-hydrochlorothiazide (PRINZIDE,ZESTORETIC) 10-12.5 MG tablet Take 1 tablet by mouth daily.    Marland Kitchen LORazepam (ATIVAN) 0.5 MG tablet Take 1 tablet (0.5 mg total) by mouth at bedtime as needed (Nausea or vomiting). 30 tablet 0  . oxyCODONE (OXY IR/ROXICODONE) 5 MG immediate release tablet Take 1 tablet (5 mg total) by mouth every  6 (six) hours as needed for moderate pain or severe pain. 15 tablet 0  . prochlorperazine (COMPAZINE) 10 MG tablet Take 1 tablet (10 mg total) by mouth every 6 (six) hours as needed (Nausea or vomiting). 30 tablet 1   No current facility-administered medications for this visit.     OBJECTIVE:  Vitals:   07/03/18 1025  BP: 107/75  Pulse: 79  Resp: 19  Temp: 98.4 F (36.9 C)  SpO2: 99%     Body mass index is 32.63 kg/m.   Wt Readings from Last 3 Encounters:  07/03/18 190 lb 1.6 oz (86.2 kg)  06/12/18 191 lb 8 oz (86.9 kg)  05/28/18 193 lb 4.8 oz (87.7 kg)   ECOG FS:1 - Symptomatic but completely ambulatory GENERAL: Patient is a well appearing female in no acute distress HEENT:  Sclerae anicteric.  Oropharynx clear and moist. No ulcerations or evidence of oropharyngeal candidiasis. Neck is supple.  NODES:  No cervical, supraclavicular, or axillary lymphadenopathy palpated.  BREAST EXAM:  Deferred. LUNGS:  Clear to auscultation bilaterally.  No  wheezes or rhonchi. HEART:  Regular rate and rhythm. No murmur appreciated. ABDOMEN:  Soft, nontender.  Positive, normoactive bowel sounds. No organomegaly palpated. MSK:  No focal spinal tenderness to palpation. Full range of motion bilaterally in the upper extremities. EXTREMITIES:  No peripheral edema.   SKIN:  Clear with no obvious rashes or skin changes. No nail dyscrasia. NEURO:  Nonfocal. Well oriented.  Appropriate affect.     LAB RESULTS:  CMP     Component Value Date/Time   NA 137 06/25/2018 0814   K 3.9 06/25/2018 0814   CL 101 06/25/2018 0814   CO2 27 06/25/2018 0814   GLUCOSE 93 06/25/2018 0814   BUN 17 06/25/2018 0814   CREATININE 0.96 06/25/2018 0814   CALCIUM 9.2 06/25/2018 0814   PROT 6.9 06/25/2018 0814   ALBUMIN 4.0 06/25/2018 0814   AST 16 06/25/2018 0814   ALT 20 06/25/2018 0814   ALKPHOS 66 06/25/2018 0814   BILITOT 0.4 06/25/2018 0814   GFRNONAA >60 06/25/2018 0814   GFRAA >60 06/25/2018 0814    No results found for: TOTALPROTELP, ALBUMINELP, A1GS, A2GS, BETS, BETA2SER, GAMS, MSPIKE, SPEI  No results found for: KPAFRELGTCHN, LAMBDASER, KAPLAMBRATIO  Lab Results  Component Value Date   WBC 4.3 07/03/2018   NEUTROABS 1.8 07/03/2018   HGB 10.8 (L) 07/03/2018   HCT 31.8 (L) 07/03/2018   MCV 93.0 07/03/2018   PLT 168 07/03/2018    @LASTCHEMISTRY @  No results found for: LABCA2  No components found for: [id number]  No results for input(s): INR in the last 168 hours.  No results found for: LABCA2  No results found for: YQM578  No results found for: ION629  No results found for: BMW413  No results found for: CA2729  No components found for: HGQUANT  No results found for: CEA1 / No results found for: CEA1   No results found for: AFPTUMOR  No results found for: CHROMOGRNA  No results found for: PSA1  Appointment on 07/03/2018  Component Date Value Ref Range Status  . WBC Count 07/03/2018 4.3  3.9 - 10.3 K/uL Final  . RBC  07/03/2018 3.42* 3.70 - 5.45 MIL/uL Final  . Hemoglobin 07/03/2018 10.8* 11.6 - 15.9 g/dL Final  . HCT 07/03/2018 31.8* 34.8 - 46.6 % Final  . MCV 07/03/2018 93.0  79.5 - 101.0 fL Final  . MCH 07/03/2018 31.6  25.1 - 34.0 pg Final  . MCHC  07/03/2018 34.0  31.5 - 36.0 g/dL Final  . RDW 07/03/2018 13.2  11.2 - 14.5 % Final  . Platelet Count 07/03/2018 168  145 - 400 K/uL Final  . Neutrophils Relative % 07/03/2018 42  % Final  . Neutro Abs 07/03/2018 1.8  1.5 - 6.5 K/uL Final  . Lymphocytes Relative 07/03/2018 35  % Final  . Lymphs Abs 07/03/2018 1.5  0.9 - 3.3 K/uL Final  . Monocytes Relative 07/03/2018 19  % Final  . Monocytes Absolute 07/03/2018 0.8  0.1 - 0.9 K/uL Final  . Eosinophils Relative 07/03/2018 3  % Final  . Eosinophils Absolute 07/03/2018 0.1  0.0 - 0.5 K/uL Final  . Basophils Relative 07/03/2018 1  % Final  . Basophils Absolute 07/03/2018 0.1  0.0 - 0.1 K/uL Final   Performed at [facility], [address] [place], [place] [address]    (this displays the last labs from the last 3 days)  No results found for: TOTALPROTELP, ALBUMINELP, A1GS, A2GS, BETS, BETA2SER, GAMS, MSPIKE, SPEI (this displays SPEP labs)  No results found for: KPAFRELGTCHN, LAMBDASER, KAPLAMBRATIO (kappa/lambda light chains)  No results found for: HGBA, HGBA2QUANT, HGBFQUANT, HGBSQUAN (Hemoglobinopathy evaluation)   No results found for: LDH  No results found for: IRON, TIBC, IRONPCTSAT (Iron and TIBC)  No results found for: FERRITIN  Urinalysis    Component Value Date/Time   COLORURINE YELLOW 06/25/2018 0815   APPEARANCEUR HAZY (A) 06/25/2018 0815   LABSPEC 1.019 06/25/2018 0815   PHURINE 6.0 06/25/2018 0815   GLUCOSEU NEGATIVE 06/25/2018 0815   HGBUR NEGATIVE 06/25/2018 0815   BILIRUBINUR NEGATIVE 06/25/2018 0815   KETONESUR NEGATIVE 06/25/2018 0815   PROTEINUR NEGATIVE 06/25/2018 0815   NITRITE NEGATIVE 06/25/2018 0815   LEUKOCYTESUR NEGATIVE 06/25/2018  0815     STUDIES: No results found.  ELIGIBLE FOR AVAILABLE RESEARCH PROTOCOL: BCEP   ASSESSMENT: 52 y.o. [place], [place] woman status post right breast upper outer quadrant biopsy 02/07/2018 for a clinical T1c-T2 N0, stage IA-B invasive ductal carcinoma, grade 2, estrogen receptor and progesterone receptor positive, HER-2 not amplified, with an MIB-1 of 10%  (1) status post right lumpectomy 03/18/2018 for a pT1c pN0, stage IA invasive ductal carcinoma, grade 2, estrogen and progesterone receptor positive, with negative margins.  A total of 1 lymph node was removed  (2) Mammaprint on 03/18/2018 showed high risk, indicating that with chemotherapy and hormone therapy, the patient would have a nearly 95% chance of having no distant metastases within the next 5 years  (3) chemotherapy will consist of doxorubicin and cyclophosphamide in dose dense fashion x4 starting 06/04/2018, to be followed by weekly paclitaxel x12  (a) baseline echocardiogram on 05/23/2018 showed an ejection fraction in the 65-75% range  (4) adjuvant radiation to follow  (5) antiestrogens to follow at the completion of local treatment  (6) genetics testing 03/13/2018 through the Common Hereditary Cancer Panel offered by [facility] ifound no deleterious mutations in APC, ATM, AXIN2, BARD1, BMPR1A, BRCA1, BRCA2, BRIP1, CDH1, CDKN2A (p14ARF), CDKN2A (p16INK4a), CKD4, CHEK2, CTNNA1, DICER1, EPCAM (Deletion/duplication testing only), GREM1 (promoter region deletion/duplication testing only), KIT, MEN1, MLH1, MSH2, MSH3, MSH6, MUTYH, NBN, NF1, NHTL1, PALB2, PDGFRA, PMS2, POLD1, POLE, PTEN, RAD50, RAD51C, RAD51D, SDHB, SDHC, SDHD, SMAD4, SMARCA4. STK11, TP53, TSC1, TSC2, and VHL.  The following genes were evaluated for sequence changes only: SDHA and HOXB13 c.251G>A variant only.  (a)  A variant of uncertain significance (VUS) in the gene MSH3 was also identified c.1027+4T>C (Intronic).    PLAN:  Millenia is feeling well today.  I reviewed  her labs with her in detail.  She is not neutropenic.  She is tolerating her treatment well.  I removed the Lorazepam from her treatment plant.  She will return in one week for labs and cycle 3 of chemotherapy, and in 2 weeks for labs, and f/u with myself or Dr. Jana Hakim.   She knows to call for any problems that may develop before the next visit.  A total of (20) minutes of face-to-face time was spent with this patient with greater than 50% of that time in counseling and care-coordination.    Wilber Bihari, NP 07/03/18 10:39 AM Medical Oncology and Hematology [facility] [address] [place], [place] [address] Tel. [phone]    Fax. [phone]

## 2018-07-03 NOTE — Telephone Encounter (Signed)
Per 7/24 decline avs and calendar

## 2018-07-03 NOTE — Telephone Encounter (Signed)
Printed medical records for patient to pick up, Release ID: [id number]

## 2018-07-09 ENCOUNTER — Inpatient Hospital Stay: Payer: BC Managed Care – PPO

## 2018-07-09 ENCOUNTER — Other Ambulatory Visit: Payer: Self-pay | Admitting: Hematology and Oncology

## 2018-07-09 VITALS — BP 109/69 | HR 65 | Temp 99.3°F | Resp 17

## 2018-07-09 DIAGNOSIS — Z95828 Presence of other vascular implants and grafts: Secondary | ICD-10-CM

## 2018-07-09 DIAGNOSIS — G44209 Tension-type headache, unspecified, not intractable: Secondary | ICD-10-CM

## 2018-07-09 DIAGNOSIS — C50411 Malignant neoplasm of upper-outer quadrant of right female breast: Secondary | ICD-10-CM

## 2018-07-09 DIAGNOSIS — Z5111 Encounter for antineoplastic chemotherapy: Secondary | ICD-10-CM | POA: Diagnosis not present

## 2018-07-09 DIAGNOSIS — Z170 Estrogen receptor positive status [ER+]: Secondary | ICD-10-CM

## 2018-07-09 LAB — CBC WITH DIFFERENTIAL (CANCER CENTER ONLY)
Basophils Absolute: 0.1 10*3/uL (ref 0.0–0.1)
Basophils Relative: 1 %
Eosinophils Absolute: 0.1 10*3/uL (ref 0.0–0.5)
Eosinophils Relative: 2 %
HEMATOCRIT: 32.7 % — AB (ref 34.8–46.6)
HEMOGLOBIN: 10.9 g/dL — AB (ref 11.6–15.9)
LYMPHS ABS: 1.4 10*3/uL (ref 0.9–3.3)
LYMPHS PCT: 22 %
MCH: 31.8 pg (ref 25.1–34.0)
MCHC: 33.3 g/dL (ref 31.5–36.0)
MCV: 95.3 fL (ref 79.5–101.0)
Monocytes Absolute: 0.5 10*3/uL (ref 0.1–0.9)
Monocytes Relative: 9 %
NEUTROS ABS: 4.2 10*3/uL (ref 1.5–6.5)
NEUTROS PCT: 66 %
PLATELETS: 197 10*3/uL (ref 145–400)
RBC: 3.43 MIL/uL — AB (ref 3.70–5.45)
RDW: 13.8 % (ref 11.2–14.5)
WBC: 6.2 10*3/uL (ref 3.9–10.3)

## 2018-07-09 LAB — CMP (CANCER CENTER ONLY)
ALT: 19 U/L (ref 0–44)
ANION GAP: 6 (ref 5–15)
AST: 20 U/L (ref 15–41)
Albumin: 3.6 g/dL (ref 3.5–5.0)
Alkaline Phosphatase: 65 U/L (ref 38–126)
BILIRUBIN TOTAL: 0.2 mg/dL — AB (ref 0.3–1.2)
BUN: 13 mg/dL (ref 6–20)
CHLORIDE: 105 mmol/L (ref 98–111)
CO2: 30 mmol/L (ref 22–32)
Calcium: 8.9 mg/dL (ref 8.9–10.3)
Creatinine: 0.87 mg/dL (ref 0.44–1.00)
GFR, Est AFR Am: >60 mL/min (ref >60)
GFR, Estimated: >60 mL/min (ref >60)
Glucose, Bld: 84 mg/dL (ref 70–99)
POTASSIUM: 3.7 mmol/L (ref 3.5–5.1)
Sodium: 141 mmol/L (ref 135–145)
TOTAL PROTEIN: 6.3 g/dL — AB (ref 6.5–8.1)

## 2018-07-09 MED ORDER — DOXORUBICIN HCL CHEMO IV INJECTION 2 MG/ML
60.0000 mg/m2 | Freq: Once | INTRAVENOUS | Status: AC
  Administered 2018-07-09: 118 mg via INTRAVENOUS
  Filled 2018-07-09: qty 59

## 2018-07-09 MED ORDER — PALONOSETRON HCL INJECTION 0.25 MG/5ML
INTRAVENOUS | Status: AC
  Filled 2018-07-09: qty 5

## 2018-07-09 MED ORDER — SODIUM CHLORIDE 0.9 % IV SOLN
600.0000 mg/m2 | Freq: Once | INTRAVENOUS | Status: AC
  Administered 2018-07-09: 1180 mg via INTRAVENOUS
  Filled 2018-07-09: qty 59

## 2018-07-09 MED ORDER — ACETAMINOPHEN 325 MG PO TABS
650.0000 mg | ORAL_TABLET | Freq: Once | ORAL | Status: AC
  Administered 2018-07-09: 650 mg via ORAL

## 2018-07-09 MED ORDER — SODIUM CHLORIDE 0.9% FLUSH
10.0000 mL | INTRAVENOUS | Status: DC | PRN
  Administered 2018-07-09: 10 mL
  Filled 2018-07-09: qty 10

## 2018-07-09 MED ORDER — HEPARIN SOD (PORK) LOCK FLUSH 100 UNIT/ML IV SOLN
500.0000 [IU] | Freq: Once | INTRAVENOUS | Status: AC | PRN
  Administered 2018-07-09: 500 [IU]
  Filled 2018-07-09: qty 5

## 2018-07-09 MED ORDER — SODIUM CHLORIDE 0.9 % IV SOLN
Freq: Once | INTRAVENOUS | Status: AC
  Administered 2018-07-09: 10:00:00 via INTRAVENOUS
  Filled 2018-07-09: qty 250

## 2018-07-09 MED ORDER — FOSAPREPITANT DIMEGLUMINE INJECTION 150 MG
Freq: Once | INTRAVENOUS | Status: AC
  Administered 2018-07-09: 11:00:00 via INTRAVENOUS
  Filled 2018-07-09: qty 5

## 2018-07-09 MED ORDER — PALONOSETRON HCL INJECTION 0.25 MG/5ML
0.2500 mg | Freq: Once | INTRAVENOUS | Status: AC
  Administered 2018-07-09: 0.25 mg via INTRAVENOUS

## 2018-07-09 MED ORDER — ALTEPLASE 2 MG IJ SOLR
2.0000 mg | Freq: Once | INTRAMUSCULAR | Status: AC | PRN
  Administered 2018-07-09: 2 mg
  Filled 2018-07-09: qty 2

## 2018-07-09 MED ORDER — ACETAMINOPHEN 325 MG PO TABS
ORAL_TABLET | ORAL | Status: AC
  Filled 2018-07-09: qty 2

## 2018-07-09 NOTE — Patient Instructions (Signed)
[facility] Discharge Instructions for Patients Receiving Chemotherapy  Today you received the following chemotherapy agents Adriamycin; Cytoxan  To help prevent nausea and vomiting after your treatment, we encourage you to take your nausea medication as directed   If you develop nausea and vomiting that is not controlled by your nausea medication, call the clinic.   BELOW ARE SYMPTOMS THAT SHOULD BE REPORTED IMMEDIATELY:  *FEVER GREATER THAN 100.5 F  *CHILLS WITH OR WITHOUT FEVER  NAUSEA AND VOMITING THAT IS NOT CONTROLLED WITH YOUR NAUSEA MEDICATION  *UNUSUAL SHORTNESS OF BREATH  *UNUSUAL BRUISING OR BLEEDING  TENDERNESS IN MOUTH AND THROAT WITH OR WITHOUT PRESENCE OF ULCERS  *URINARY PROBLEMS  *BOWEL PROBLEMS  UNUSUAL RASH Items with * indicate a potential emergency and should be followed up as soon as possible.  Feel free to call the clinic should you have any questions or concerns. The clinic phone number is [phone].  Please show the CHEMO ALERT CARD at check-in to the Emergency Department and triage nurse.   

## 2018-07-11 ENCOUNTER — Inpatient Hospital Stay: Payer: BC Managed Care – PPO | Attending: Oncology

## 2018-07-11 VITALS — BP 130/76 | HR 87 | Temp 98.3°F | Resp 18

## 2018-07-11 DIAGNOSIS — Z170 Estrogen receptor positive status [ER+]: Secondary | ICD-10-CM | POA: Diagnosis not present

## 2018-07-11 DIAGNOSIS — L298 Other pruritus: Secondary | ICD-10-CM | POA: Insufficient documentation

## 2018-07-11 DIAGNOSIS — Z5111 Encounter for antineoplastic chemotherapy: Secondary | ICD-10-CM | POA: Insufficient documentation

## 2018-07-11 DIAGNOSIS — C50411 Malignant neoplasm of upper-outer quadrant of right female breast: Secondary | ICD-10-CM

## 2018-07-11 DIAGNOSIS — Z7689 Persons encountering health services in other specified circumstances: Secondary | ICD-10-CM | POA: Diagnosis not present

## 2018-07-11 DIAGNOSIS — T8089XA Other complications following infusion, transfusion and therapeutic injection, initial encounter: Secondary | ICD-10-CM | POA: Insufficient documentation

## 2018-07-11 MED ORDER — PEGFILGRASTIM-CBQV 6 MG/0.6ML ~~LOC~~ SOSY
6.0000 mg | PREFILLED_SYRINGE | Freq: Once | SUBCUTANEOUS | Status: AC
  Administered 2018-07-11: 6 mg via SUBCUTANEOUS

## 2018-07-11 MED ORDER — PEGFILGRASTIM-CBQV 6 MG/0.6ML ~~LOC~~ SOSY
PREFILLED_SYRINGE | SUBCUTANEOUS | Status: AC
  Filled 2018-07-11: qty 0.6

## 2018-07-11 NOTE — Patient Instructions (Signed)
Pegfilgrastim injection What is this medicine? PEGFILGRASTIM (PEG fil gra stim) is a long-acting granulocyte colony-stimulating factor that stimulates the growth of neutrophils, a type of white blood cell important in the body's fight against infection. It is used to reduce the incidence of fever and infection in patients with certain types of cancer who are receiving chemotherapy that affects the bone marrow, and to increase survival after being exposed to high doses of radiation. This medicine may be used for other purposes; ask your health care provider or pharmacist if you have questions. COMMON BRAND NAME(S): Neulasta What should I tell my health care provider before I take this medicine? They need to know if you have any of these conditions: -kidney disease -latex allergy -ongoing radiation therapy -sickle cell disease -skin reactions to acrylic adhesives (On-Body Injector only) -an unusual or allergic reaction to pegfilgrastim, filgrastim, other medicines, foods, dyes, or preservatives -pregnant or trying to get pregnant -breast-feeding How should I use this medicine? This medicine is for injection under the skin. If you get this medicine at home, you will be taught how to prepare and give the pre-filled syringe or how to use the On-body Injector. Refer to the patient Instructions for Use for detailed instructions. Use exactly as directed. Tell your healthcare provider immediately if you suspect that the On-body Injector may not have performed as intended or if you suspect the use of the On-body Injector resulted in a missed or partial dose. It is important that you put your used needles and syringes in a special sharps container. Do not put them in a trash can. If you do not have a sharps container, call your pharmacist or healthcare provider to get one. Talk to your pediatrician regarding the use of this medicine in children. While this drug may be prescribed for selected conditions,  precautions do apply. Overdosage: If you think you have taken too much of this medicine contact a poison control center or emergency room at once. NOTE: This medicine is only for you. Do not share this medicine with others. What if I miss a dose? It is important not to miss your dose. Call your doctor or health care professional if you miss your dose. If you miss a dose due to an On-body Injector failure or leakage, a new dose should be administered as soon as possible using a single prefilled syringe for manual use. What may interact with this medicine? Interactions have not been studied. Give your health care provider a list of all the medicines, herbs, non-prescription drugs, or dietary supplements you use. Also tell them if you smoke, drink alcohol, or use illegal drugs. Some items may interact with your medicine. This list may not describe all possible interactions. Give your health care provider a list of all the medicines, herbs, non-prescription drugs, or dietary supplements you use. Also tell them if you smoke, drink alcohol, or use illegal drugs. Some items may interact with your medicine. What should I watch for while using this medicine? You may need blood work done while you are taking this medicine. If you are going to need a MRI, CT scan, or other procedure, tell your doctor that you are using this medicine (On-Body Injector only). What side effects may I notice from receiving this medicine? Side effects that you should report to your doctor or health care professional as soon as possible: -allergic reactions like skin rash, itching or hives, swelling of the face, lips, or tongue -dizziness -fever -pain, redness, or irritation at site   where injected -pinpoint red spots on the skin -red or dark-brown urine -shortness of breath or breathing problems -stomach or side pain, or pain at the shoulder -swelling -tiredness -trouble passing urine or change in the amount of urine Side  effects that usually do not require medical attention (report to your doctor or health care professional if they continue or are bothersome): -bone pain -muscle pain This list may not describe all possible side effects. Call your doctor for medical advice about side effects. You may report side effects to FDA at 1-800-FDA-1088. Where should I keep my medicine? Keep out of the reach of children. Store pre-filled syringes in a refrigerator between 2 and 8 degrees C (36 and 46 degrees F). Do not freeze. Keep in carton to protect from light. Throw away this medicine if it is left out of the refrigerator for more than 48 hours. Throw away any unused medicine after the expiration date. NOTE: This sheet is a summary. It may not cover all possible information. If you have questions about this medicine, talk to your doctor, pharmacist, or health care provider.  2018 Elsevier/Gold Standard (2016-11-23 12:58:03)  

## 2018-07-15 ENCOUNTER — Other Ambulatory Visit: Payer: Self-pay

## 2018-07-15 DIAGNOSIS — Z170 Estrogen receptor positive status [ER+]: Principal | ICD-10-CM

## 2018-07-15 DIAGNOSIS — C50411 Malignant neoplasm of upper-outer quadrant of right female breast: Secondary | ICD-10-CM

## 2018-07-16 ENCOUNTER — Ambulatory Visit: Payer: BC Managed Care – PPO

## 2018-07-16 ENCOUNTER — Inpatient Hospital Stay: Payer: BC Managed Care – PPO

## 2018-07-16 ENCOUNTER — Inpatient Hospital Stay: Payer: BC Managed Care – PPO | Admitting: Adult Health

## 2018-07-16 ENCOUNTER — Other Ambulatory Visit: Payer: BC Managed Care – PPO

## 2018-07-23 ENCOUNTER — Telehealth: Payer: Self-pay | Admitting: Adult Health

## 2018-07-23 ENCOUNTER — Inpatient Hospital Stay: Payer: BC Managed Care – PPO

## 2018-07-23 VITALS — BP 101/66 | HR 74 | Temp 98.0°F | Resp 18

## 2018-07-23 DIAGNOSIS — Z170 Estrogen receptor positive status [ER+]: Principal | ICD-10-CM

## 2018-07-23 DIAGNOSIS — C50411 Malignant neoplasm of upper-outer quadrant of right female breast: Secondary | ICD-10-CM

## 2018-07-23 DIAGNOSIS — Z95828 Presence of other vascular implants and grafts: Secondary | ICD-10-CM

## 2018-07-23 DIAGNOSIS — Z5111 Encounter for antineoplastic chemotherapy: Secondary | ICD-10-CM | POA: Diagnosis not present

## 2018-07-23 LAB — CBC WITH DIFFERENTIAL (CANCER CENTER ONLY)
BASOS ABS: 0 10*3/uL (ref 0.0–0.1)
BASOS PCT: 1 %
EOS ABS: 0 10*3/uL (ref 0.0–0.5)
EOS PCT: 0 %
HCT: 31.6 % — ABNORMAL LOW (ref 34.8–46.6)
HEMOGLOBIN: 10.7 g/dL — AB (ref 11.6–15.9)
LYMPHS ABS: 1.1 10*3/uL (ref 0.9–3.3)
Lymphocytes Relative: 14 %
MCH: 31.9 pg (ref 25.1–34.0)
MCHC: 33.9 g/dL (ref 31.5–36.0)
MCV: 94.3 fL (ref 79.5–101.0)
Monocytes Absolute: 0.9 10*3/uL (ref 0.1–0.9)
Monocytes Relative: 12 %
NEUTROS PCT: 73 %
Neutro Abs: 5.8 10*3/uL (ref 1.5–6.5)
PLATELETS: 252 10*3/uL (ref 145–400)
RBC: 3.35 MIL/uL — AB (ref 3.70–5.45)
RDW: 14.6 % — ABNORMAL HIGH (ref 11.2–14.5)
WBC: 7.8 10*3/uL (ref 3.9–10.3)

## 2018-07-23 LAB — CMP (CANCER CENTER ONLY)
ALT: 17 U/L (ref 0–44)
AST: 15 U/L (ref 15–41)
Albumin: 3.8 g/dL (ref 3.5–5.0)
Alkaline Phosphatase: 67 U/L (ref 38–126)
Anion gap: 11 (ref 5–15)
BILIRUBIN TOTAL: 0.4 mg/dL (ref 0.3–1.2)
BUN: 12 mg/dL (ref 6–20)
CHLORIDE: 103 mmol/L (ref 98–111)
CO2: 24 mmol/L (ref 22–32)
CREATININE: 0.84 mg/dL (ref 0.44–1.00)
Calcium: 9.1 mg/dL (ref 8.9–10.3)
Glucose, Bld: 90 mg/dL (ref 70–99)
POTASSIUM: 3.8 mmol/L (ref 3.5–5.1)
Sodium: 138 mmol/L (ref 135–145)
Total Protein: 6.6 g/dL (ref 6.5–8.1)

## 2018-07-23 MED ORDER — SODIUM CHLORIDE 0.9 % IV SOLN
INTRAVENOUS | Status: AC
  Administered 2018-07-23: 11:00:00 via INTRAVENOUS
  Filled 2018-07-23 (×2): qty 250

## 2018-07-23 MED ORDER — SODIUM CHLORIDE 0.9 % IV SOLN
600.0000 mg/m2 | Freq: Once | INTRAVENOUS | Status: AC
  Administered 2018-07-23: 1180 mg via INTRAVENOUS
  Filled 2018-07-23: qty 59

## 2018-07-23 MED ORDER — DOXORUBICIN HCL CHEMO IV INJECTION 2 MG/ML
60.0000 mg/m2 | Freq: Once | INTRAVENOUS | Status: AC
  Administered 2018-07-23: 118 mg via INTRAVENOUS
  Filled 2018-07-23: qty 59

## 2018-07-23 MED ORDER — SODIUM CHLORIDE 0.9% FLUSH
10.0000 mL | INTRAVENOUS | Status: DC | PRN
  Administered 2018-07-23: 10 mL
  Filled 2018-07-23: qty 10

## 2018-07-23 MED ORDER — SODIUM CHLORIDE 0.9 % IV SOLN
Freq: Once | INTRAVENOUS | Status: AC
  Administered 2018-07-23: 09:00:00 via INTRAVENOUS
  Filled 2018-07-23: qty 250

## 2018-07-23 MED ORDER — SODIUM CHLORIDE 0.9 % IV SOLN
Freq: Once | INTRAVENOUS | Status: AC
  Administered 2018-07-23: 10:00:00 via INTRAVENOUS
  Filled 2018-07-23: qty 5

## 2018-07-23 MED ORDER — PALONOSETRON HCL INJECTION 0.25 MG/5ML
0.2500 mg | Freq: Once | INTRAVENOUS | Status: AC
  Administered 2018-07-23: 0.25 mg via INTRAVENOUS

## 2018-07-23 MED ORDER — PALONOSETRON HCL INJECTION 0.25 MG/5ML
INTRAVENOUS | Status: AC
  Filled 2018-07-23: qty 5

## 2018-07-23 MED ORDER — HEPARIN SOD (PORK) LOCK FLUSH 100 UNIT/ML IV SOLN
500.0000 [IU] | Freq: Once | INTRAVENOUS | Status: AC | PRN
  Administered 2018-07-23: 500 [IU]
  Filled 2018-07-23: qty 5

## 2018-07-23 NOTE — Progress Notes (Signed)
1024 Patient complained of frontal headache radiating to her temples with vision changes. Vital signs taken. 23/52 Notified Dr. Jana Hakim.  New orders for a liter of saline given over 2 hours  1115 Patient reports headache has pretty much resolved. Vision is normal. Dr. Jana Hakim advised ok to continue to with treatment.  1117 Adriamycin administration started with checking for blood return. Port did not return any blood. Positional changes and flushing line produced no results. Charge nurse notified. Patient laid completely flat, turned to her right side with her left arm above her head and blood was noted. Adriamycin administered with positive blood return every 3 cc's.

## 2018-07-23 NOTE — Telephone Encounter (Signed)
Tried to reach regarding 8/15 I did leave a message

## 2018-07-23 NOTE — Patient Instructions (Signed)
[facility] Discharge Instructions for Patients Receiving Chemotherapy  Today you received the following chemotherapy agents Adriamycin; Cytoxan  To help prevent nausea and vomiting after your treatment, we encourage you to take your nausea medication as directed   If you develop nausea and vomiting that is not controlled by your nausea medication, call the clinic.   BELOW ARE SYMPTOMS THAT SHOULD BE REPORTED IMMEDIATELY:  *FEVER GREATER THAN 100.5 F  *CHILLS WITH OR WITHOUT FEVER  NAUSEA AND VOMITING THAT IS NOT CONTROLLED WITH YOUR NAUSEA MEDICATION  *UNUSUAL SHORTNESS OF BREATH  *UNUSUAL BRUISING OR BLEEDING  TENDERNESS IN MOUTH AND THROAT WITH OR WITHOUT PRESENCE OF ULCERS  *URINARY PROBLEMS  *BOWEL PROBLEMS  UNUSUAL RASH Items with * indicate a potential emergency and should be followed up as soon as possible.  Feel free to call the clinic should you have any questions or concerns. The clinic phone number is [phone].  Please show the CHEMO ALERT CARD at check-in to the Emergency Department and triage nurse.   

## 2018-07-25 ENCOUNTER — Telehealth: Payer: Self-pay | Admitting: *Deleted

## 2018-07-25 ENCOUNTER — Inpatient Hospital Stay: Payer: BC Managed Care – PPO

## 2018-07-25 DIAGNOSIS — Z5111 Encounter for antineoplastic chemotherapy: Secondary | ICD-10-CM | POA: Diagnosis not present

## 2018-07-25 DIAGNOSIS — C50411 Malignant neoplasm of upper-outer quadrant of right female breast: Secondary | ICD-10-CM

## 2018-07-25 DIAGNOSIS — Z170 Estrogen receptor positive status [ER+]: Principal | ICD-10-CM

## 2018-07-25 MED ORDER — PEGFILGRASTIM-CBQV 6 MG/0.6ML ~~LOC~~ SOSY
PREFILLED_SYRINGE | SUBCUTANEOUS | Status: AC
  Filled 2018-07-25: qty 0.6

## 2018-07-25 MED ORDER — PEGFILGRASTIM-CBQV 6 MG/0.6ML ~~LOC~~ SOSY
6.0000 mg | PREFILLED_SYRINGE | Freq: Once | SUBCUTANEOUS | Status: AC
  Administered 2018-07-25: 6 mg via SUBCUTANEOUS

## 2018-07-25 NOTE — Telephone Encounter (Signed)
This RN spoke with Anna Conrad per her call to [facility] pm 8/14.  [name] states she needs to come in for an injection- and did not have an appointment- she states she has tried to call scheduling several times.  This RN noted Anna Conrad was treated on 8/13 with an injection appointment today at 1 pm- informed [name] who states she was unaware appointment was made.  She has a meeting at work and inquired if she could come in post 2pm.  This RN informed her above is appropriate.  No further needs at this time.  Note made on appointment per above, time not changed due to concern with new scheduling template and communication with staff.

## 2018-07-30 ENCOUNTER — Ambulatory Visit: Payer: BC Managed Care – PPO | Admitting: Adult Health

## 2018-07-30 ENCOUNTER — Other Ambulatory Visit: Payer: BC Managed Care – PPO

## 2018-07-31 ENCOUNTER — Telehealth: Payer: Self-pay | Admitting: Oncology

## 2018-07-31 NOTE — Telephone Encounter (Signed)
Left message for pt regarding upcoming appts per 8/19 sch message

## 2018-08-09 ENCOUNTER — Other Ambulatory Visit: Payer: BC Managed Care – PPO

## 2018-08-09 ENCOUNTER — Encounter: Payer: Self-pay | Admitting: Adult Health

## 2018-08-09 ENCOUNTER — Inpatient Hospital Stay: Payer: BC Managed Care – PPO

## 2018-08-09 ENCOUNTER — Telehealth: Payer: Self-pay | Admitting: Medical

## 2018-08-09 ENCOUNTER — Inpatient Hospital Stay (HOSPITAL_BASED_OUTPATIENT_CLINIC_OR_DEPARTMENT_OTHER): Payer: BC Managed Care – PPO | Admitting: Adult Health

## 2018-08-09 ENCOUNTER — Inpatient Hospital Stay (HOSPITAL_BASED_OUTPATIENT_CLINIC_OR_DEPARTMENT_OTHER): Payer: BC Managed Care – PPO | Admitting: Medical

## 2018-08-09 ENCOUNTER — Ambulatory Visit: Payer: BC Managed Care – PPO | Admitting: Adult Health

## 2018-08-09 ENCOUNTER — Telehealth: Payer: Self-pay | Admitting: Adult Health

## 2018-08-09 VITALS — BP 119/72 | HR 68 | Temp 98.6°F | Resp 18 | Ht 64.0 in | Wt 198.7 lb

## 2018-08-09 VITALS — BP 119/80 | HR 85 | Temp 97.8°F | Resp 18

## 2018-08-09 DIAGNOSIS — Z5111 Encounter for antineoplastic chemotherapy: Secondary | ICD-10-CM | POA: Diagnosis not present

## 2018-08-09 DIAGNOSIS — Z170 Estrogen receptor positive status [ER+]: Principal | ICD-10-CM

## 2018-08-09 DIAGNOSIS — C50411 Malignant neoplasm of upper-outer quadrant of right female breast: Secondary | ICD-10-CM

## 2018-08-09 DIAGNOSIS — T8090XA Unspecified complication following infusion and therapeutic injection, initial encounter: Secondary | ICD-10-CM

## 2018-08-09 LAB — CBC WITH DIFFERENTIAL (CANCER CENTER ONLY)
BASOS ABS: 0 10*3/uL (ref 0.0–0.1)
BASOS PCT: 1 %
EOS ABS: 0 10*3/uL (ref 0.0–0.5)
EOS PCT: 0 %
HCT: 28.7 % — ABNORMAL LOW (ref 34.8–46.6)
Hemoglobin: 9.6 g/dL — ABNORMAL LOW (ref 11.6–15.9)
Lymphocytes Relative: 15 %
Lymphs Abs: 0.7 10*3/uL — ABNORMAL LOW (ref 0.9–3.3)
MCH: 32.3 pg (ref 25.1–34.0)
MCHC: 33.4 g/dL (ref 31.5–36.0)
MCV: 96.6 fL (ref 79.5–101.0)
MONO ABS: 0.7 10*3/uL (ref 0.1–0.9)
Monocytes Relative: 15 %
Neutro Abs: 3.2 10*3/uL (ref 1.5–6.5)
Neutrophils Relative %: 69 %
PLATELETS: 394 10*3/uL (ref 145–400)
RBC: 2.97 MIL/uL — AB (ref 3.70–5.45)
RDW: 16.6 % — AB (ref 11.2–14.5)
WBC Count: 4.6 10*3/uL (ref 3.9–10.3)

## 2018-08-09 LAB — CMP (CANCER CENTER ONLY)
ALBUMIN: 3.8 g/dL (ref 3.5–5.0)
ALT: 16 U/L (ref 0–44)
AST: 18 U/L (ref 15–41)
Alkaline Phosphatase: 52 U/L (ref 38–126)
Anion gap: 9 (ref 5–15)
BUN: 18 mg/dL (ref 6–20)
CHLORIDE: 107 mmol/L (ref 98–111)
CO2: 25 mmol/L (ref 22–32)
Calcium: 9.1 mg/dL (ref 8.9–10.3)
Creatinine: 0.83 mg/dL (ref 0.44–1.00)
GFR, Est AFR Am: >60 mL/min (ref >60)
GLUCOSE: 100 mg/dL — AB (ref 70–99)
POTASSIUM: 3.9 mmol/L (ref 3.5–5.1)
SODIUM: 141 mmol/L (ref 135–145)
Total Bilirubin: 0.3 mg/dL (ref 0.3–1.2)
Total Protein: 6.3 g/dL — ABNORMAL LOW (ref 6.5–8.1)

## 2018-08-09 MED ORDER — DIPHENHYDRAMINE HCL 50 MG/ML IJ SOLN
INTRAMUSCULAR | Status: AC
  Filled 2018-08-09: qty 1

## 2018-08-09 MED ORDER — SODIUM CHLORIDE 0.9% FLUSH
10.0000 mL | INTRAVENOUS | Status: DC | PRN
  Administered 2018-08-09: 10 mL
  Filled 2018-08-09: qty 10

## 2018-08-09 MED ORDER — MICONAZOLE NITRATE 2 % VA CREA: 1.0000 | TOPICAL_CREAM | Freq: Every day | VAGINAL | 0 refills | Status: DC

## 2018-08-09 MED ORDER — SODIUM CHLORIDE 0.9 % IV SOLN
Freq: Once | INTRAVENOUS | Status: AC
  Administered 2018-08-09: 11:00:00 via INTRAVENOUS
  Filled 2018-08-09: qty 250

## 2018-08-09 MED ORDER — HEPARIN SOD (PORK) LOCK FLUSH 100 UNIT/ML IV SOLN
500.0000 [IU] | Freq: Once | INTRAVENOUS | Status: AC | PRN
  Administered 2018-08-09: 500 [IU]
  Filled 2018-08-09: qty 5

## 2018-08-09 MED ORDER — SODIUM CHLORIDE 0.9 % IV SOLN
80.0000 mg/m2 | Freq: Once | INTRAVENOUS | Status: AC
  Administered 2018-08-09: 156 mg via INTRAVENOUS
  Filled 2018-08-09: qty 26

## 2018-08-09 MED ORDER — FAMOTIDINE IN NACL 20-0.9 MG/50ML-% IV SOLN
20.0000 mg | Freq: Once | INTRAVENOUS | Status: AC | PRN
  Administered 2018-08-09: 20 mg via INTRAVENOUS

## 2018-08-09 MED ORDER — SODIUM CHLORIDE 0.9 % IV SOLN
20.0000 mg | Freq: Once | INTRAVENOUS | Status: AC
  Administered 2018-08-09: 20 mg via INTRAVENOUS
  Filled 2018-08-09: qty 2

## 2018-08-09 MED ORDER — METHYLPREDNISOLONE SODIUM SUCC 125 MG IJ SOLR
125.0000 mg | Freq: Once | INTRAMUSCULAR | Status: AC | PRN
  Administered 2018-08-09: 125 mg via INTRAVENOUS

## 2018-08-09 MED ORDER — FAMOTIDINE IN NACL 20-0.9 MG/50ML-% IV SOLN
INTRAVENOUS | Status: AC
  Filled 2018-08-09: qty 50

## 2018-08-09 MED ORDER — DIPHENHYDRAMINE HCL 50 MG/ML IJ SOLN
25.0000 mg | Freq: Once | INTRAMUSCULAR | Status: AC | PRN
  Administered 2018-08-09: 25 mg via INTRAVENOUS

## 2018-08-09 MED ORDER — FAMOTIDINE IN NACL 20-0.9 MG/50ML-% IV SOLN
20.0000 mg | Freq: Once | INTRAVENOUS | Status: AC
  Administered 2018-08-09: 20 mg via INTRAVENOUS

## 2018-08-09 MED ORDER — DIPHENHYDRAMINE HCL 50 MG/ML IJ SOLN
25.0000 mg | Freq: Once | INTRAMUSCULAR | Status: AC
  Administered 2018-08-09: 25 mg via INTRAVENOUS

## 2018-08-09 NOTE — Telephone Encounter (Signed)
No 8/30 los/orders/referrals

## 2018-08-09 NOTE — Progress Notes (Signed)
Taxol infusion started at 12:24. At 12:34 pt began c/o of itching around [facility]. A small red area observed in 10 o'clock position. Infusion stopped, saline line open wide open, and [facility] notified. Gave Solumedrol 125 mg IV at 12:35 from reaction kit. Upon Van's arrival and after his assessment, received orders to give another 20 mg IVPB of Pepcid. Orders placed and medication administered at 12:40. Pt stated the itching sensation had improved. Received another order from [place] [facility] to give another 25 mg IV Benadryl, monitor for another 15 min, and then restart Taxol infusion if vitals were stable and pt felt well. Benadryl given at 12:52, vitals taken at 13:10, pt felt fine, and Taxol infusion restarted. Will continue to monitor  Vitals:   08/09/18 1255 08/09/18 1310 08/09/18 1315 08/09/18 1330  BP: 124/81 104/80 113/87 128/88  Pulse: 84 81 83 82  Resp: 17 18 18 20   Temp:  98.4 F (36.9 C) 98.5 F (36.9 C) 98.2 F (36.8 C)  TempSrc:  Oral Oral Oral  SpO2: 100% 100% 100% 99%    Pt was able to tolerate remainder of Taxol infusion. Discharge instructions reviewed with pt and husband, and all questions/concerns addressed. Pt know to call should her hives return or if any other symptoms/issues arise.

## 2018-08-09 NOTE — Progress Notes (Signed)
    DATE:  08/09/2018                                      X CHEMO/IMMUNOTHERAPY REACTION             MD: Dr. Jana Hakim   AGENT/BLOOD PRODUCT RECEIVING TODAY:               Paclitaxel   AGENT/BLOOD PRODUCT RECEIVING IMMEDIATELY PRIOR TO REACTION:           Paclitaxel   VS: BP:      119/80   P:        85       SPO2:        98% on room air                   REACTION(S):            Erythema, itching and hives surrounding a left chest wall Port-A-Cath in an area corresponding with an occlusive dressing.   PREMEDS:      Pepcid 20 mg IV x1, Benadryl 25 mg IV x1, and Decadron 20 mg IV x1   INTERVENTION: Solu-Medrol 125 mg IV x1 (given prior to assessment), Pepcid 20 mg IV x1, and Benadryl 25 mg IV x1   Review of Systems  Review of Systems  Constitutional: Negative for chills, diaphoresis and fever.  HENT: Negative for trouble swallowing and voice change.   Respiratory: Negative for cough, chest tightness, shortness of breath and wheezing.   Cardiovascular: Negative for chest pain and palpitations.  Gastrointestinal: Negative for abdominal pain, constipation, diarrhea, nausea and vomiting.  Musculoskeletal: Negative for back pain and myalgias.  Skin: Positive for rash.       Erythema, itching and hives surrounding a left chest wall Port-A-Cath in an area corresponding with an occlusive dressing.  Neurological: Negative for dizziness, light-headedness and headaches.     Physical Exam  Physical Exam  Constitutional: No distress.  HENT:  Head: Normocephalic and atraumatic.  Cardiovascular: Normal rate, regular rhythm and normal heart sounds. Exam reveals no gallop and no friction rub.  No murmur heard. Pulmonary/Chest: Effort normal and breath sounds normal. No respiratory distress. She has no wheezes. She has no rales.  Neurological: She is alert.  Skin: Skin is warm and dry. Rash noted. She is not diaphoretic. There is erythema.  Erythema and hives surrounding a left chest wall  Port-A-Cath in an area corresponding with an occlusive dressing.       OUTCOME:                Erythema, itching, and hives resolved.  The patient was able to restart and complete dosing with Taxol.    Sandi Mealy, MHS, PA-C

## 2018-08-09 NOTE — Patient Instructions (Signed)
[facility] Discharge Instructions for Patients Receiving Chemotherapy  Today you received the following chemotherapy agents: Paclitaxel (Taxol)  To help prevent nausea and vomiting after your treatment, we encourage you to take your nausea medication as directed.    If you develop nausea and vomiting that is not controlled by your nausea medication, call the clinic.   BELOW ARE SYMPTOMS THAT SHOULD BE REPORTED IMMEDIATELY:  *FEVER GREATER THAN 100.5 F  *CHILLS WITH OR WITHOUT FEVER  NAUSEA AND VOMITING THAT IS NOT CONTROLLED WITH YOUR NAUSEA MEDICATION  *UNUSUAL SHORTNESS OF BREATH  *UNUSUAL BRUISING OR BLEEDING  TENDERNESS IN MOUTH AND THROAT WITH OR WITHOUT PRESENCE OF ULCERS  *URINARY PROBLEMS  *BOWEL PROBLEMS  UNUSUAL RASH Items with * indicate a potential emergency and should be followed up as soon as possible.  Feel free to call the clinic should you have any questions or concerns. The clinic phone number is [phone].  Please show the [facility] at check-in to the Emergency Department and triage nurse.  Paclitaxel injection What is this medicine? PACLITAXEL (PAK li TAX el) is a chemotherapy drug. It targets fast dividing cells, like cancer cells, and causes these cells to die. This medicine is used to treat ovarian cancer, breast cancer, and other cancers. This medicine may be used for other purposes; ask your health care provider or pharmacist if you have questions. COMMON BRAND NAME(S): Onxol, Taxol What should I tell my health care provider before I take this medicine? They need to know if you have any of these conditions: -blood disorders -irregular heartbeat -infection (especially a virus infection such as chickenpox, cold sores, or herpes) -liver disease -previous or ongoing radiation therapy -an unusual or allergic reaction to paclitaxel, alcohol, polyoxyethylated castor oil, other chemotherapy agents, other medicines, foods, dyes,  or preservatives -pregnant or trying to get pregnant -breast-feeding How should I use this medicine? This drug is given as an infusion into a vein. It is administered in a hospital or clinic by a specially trained health care professional. Talk to your pediatrician regarding the use of this medicine in children. Special care may be needed. Overdosage: If you think you have taken too much of this medicine contact a poison control center or emergency room at once. NOTE: This medicine is only for you. Do not share this medicine with others. What if I miss a dose? It is important not to miss your dose. Call your doctor or health care professional if you are unable to keep an appointment. What may interact with this medicine? Do not take this medicine with any of the following medications: -disulfiram -metronidazole This medicine may also interact with the following medications: -cyclosporine -diazepam -ketoconazole -medicines to increase blood counts like filgrastim, pegfilgrastim, sargramostim -other chemotherapy drugs like cisplatin, doxorubicin, epirubicin, etoposide, teniposide, vincristine -quinidine -testosterone -vaccines -verapamil Talk to your doctor or health care professional before taking any of these medicines: -acetaminophen -aspirin -ibuprofen -ketoprofen -naproxen This list may not describe all possible interactions. Give your health care provider a list of all the medicines, herbs, non-prescription drugs, or dietary supplements you use. Also tell them if you smoke, drink alcohol, or use illegal drugs. Some items may interact with your medicine. What should I watch for while using this medicine? Your condition will be monitored carefully while you are receiving this medicine. You will need important blood work done while you are taking this medicine. This medicine can cause serious allergic reactions. To reduce your risk you will need  to take other medicine(s) before  treatment with this medicine. If you experience allergic reactions like skin rash, itching or hives, swelling of the face, lips, or tongue, tell your doctor or health care professional right away. In some cases, you may be given additional medicines to help with side effects. Follow all directions for their use. This drug may make you feel generally unwell. This is not uncommon, as chemotherapy can affect healthy cells as well as cancer cells. Report any side effects. Continue your course of treatment even though you feel ill unless your doctor tells you to stop. Call your doctor or health care professional for advice if you get a fever, chills or sore throat, or other symptoms of a cold or flu. Do not treat yourself. This drug decreases your body's ability to fight infections. Try to avoid being around people who are sick. This medicine may increase your risk to bruise or bleed. Call your doctor or health care professional if you notice any unusual bleeding. Be careful brushing and flossing your teeth or using a toothpick because you may get an infection or bleed more easily. If you have any dental work done, tell your dentist you are receiving this medicine. Avoid taking products that contain aspirin, acetaminophen, ibuprofen, naproxen, or ketoprofen unless instructed by your doctor. These medicines may hide a fever. Do not become pregnant while taking this medicine. Women should inform their doctor if they wish to become pregnant or think they might be pregnant. There is a potential for serious side effects to an unborn child. Talk to your health care professional or pharmacist for more information. Do not breast-feed an infant while taking this medicine. Men are advised not to father a child while receiving this medicine. This product may contain alcohol. Ask your pharmacist or healthcare provider if this medicine contains alcohol. Be sure to tell all healthcare providers you are taking this medicine.  Certain medicines, like metronidazole and disulfiram, can cause an unpleasant reaction when taken with alcohol. The reaction includes flushing, headache, nausea, vomiting, sweating, and increased thirst. The reaction can last from 30 minutes to several hours. What side effects may I notice from receiving this medicine? Side effects that you should report to your doctor or health care professional as soon as possible: -allergic reactions like skin rash, itching or hives, swelling of the face, lips, or tongue -low blood counts - This drug may decrease the number of white blood cells, red blood cells and platelets. You may be at increased risk for infections and bleeding. -signs of infection - fever or chills, cough, sore throat, pain or difficulty passing urine -signs of decreased platelets or bleeding - bruising, pinpoint red spots on the skin, black, tarry stools, nosebleeds -signs of decreased red blood cells - unusually weak or tired, fainting spells, lightheadedness -breathing problems -chest pain -high or low blood pressure -mouth sores -nausea and vomiting -pain, swelling, redness or irritation at the injection site -pain, tingling, numbness in the hands or feet -slow or irregular heartbeat -swelling of the ankle, feet, hands Side effects that usually do not require medical attention (report to your doctor or health care professional if they continue or are bothersome): -bone pain -complete hair loss including hair on your head, underarms, pubic hair, eyebrows, and eyelashes -changes in the color of fingernails -diarrhea -loosening of the fingernails -loss of appetite -muscle or joint pain -red flush to skin -sweating This list may not describe all possible side effects. Call your doctor for medical advice  about side effects. You may report side effects to FDA at 1-800-FDA-1088. Where should I keep my medicine? This drug is given in a hospital or clinic and will not be stored at  home. NOTE: This sheet is a summary. It may not cover all possible information. If you have questions about this medicine, talk to your doctor, pharmacist, or health care provider.  2018 Elsevier/Gold Standard (2015-09-28 19:58:00)

## 2018-08-09 NOTE — Telephone Encounter (Signed)
No 8/30 los/orders/referrals [facility] visit)

## 2018-08-09 NOTE — Progress Notes (Signed)
[place]  [phone] [phone]     ID: Anna Conrad DOB: [birth year]  MR#: [id number]  WTU#:[id number]  Patient Care Team: Filiberto Pinks as PCP - General (Physician Assistant) Magrinat, Virgie Dad, MD as Consulting Physician (Oncology) Kyung Rudd, MD as Consulting Physician (Radiation Oncology) [name], MD as Consulting Physician (General Surgery) Janyth Pupa, DO as Consulting Physician (Obstetrics and Gynecology) Janyth Pupa, DO as Consulting Physician (Obstetrics and Gynecology) OTHER MD:  CHIEF COMPLAINT: Estrogen receptor positive breast cancer  CURRENT TREATMENT: Adjuvant chemotherapy  INTERVAL HISTORY: Anna Conrad returns today for follow up and treatment of her estrogen receptor positive breast cancer accompanied by her husband.  She has received four cycles of Doxoribucin and Cyclophosphamide, and is due to day to start Paclitaxel today.  Today is cycle 1.    REVIEW OF SYSTEMS: Anna Conrad tolerated her treatment pretty well.  For some reason, despite my LOS back in July, she had scheduling issues, and tried to get in with our nurses on the phone and with scheduling, and has not been seen since then.  She tells me she tolerated her last two treatments with Doxorubicin and Cyclophosphamide moderately well.  She did have some fatigue and nasuea that she managed.    Anna Conrad will start Paclitaxel.  She is fatigued.  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  This week has been slightly overwhelming in that regard.  She feels like she is getting a yeast infection and is requesting that I send in vaginal anti fungal cream for her.  She denies any other issues such as fevers, chills, nausea, vomiting, constipation, diarrhea, headaches, vision changes, or any other concerns.    HISTORY OF CURRENT ILLNESS: From the original intake note:  Anna Conrad had routine screening  mammography on 01/29/2018 showing a possible mass with associated calcifications in the right breast. She underwent unilateral right diagnostic mammography with tomography and right breast ultrasonography at [facility] on 02/05/2018 showing a highly suspicious mass over the 10:30 position upper outer of the right breast located 6 cm from the nipple measuring 1.3 x 1.4 x 1.5 cm. Associated microcalcifications corresponding to the mammographic abnormality. Ultrasound of the right axilla was normal.  Accordingly on 02/29/2019 she proceeded to biopsy of the right breast area in question. 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 Prognostic indicators significant for: estrogen receptor, 100% positive with strong staining intensity and progesterone receptor, 20% positive iwht moderate staining intensity. Proliferation marker Ki67 at 10%. HER2 not amplified with ratios HER2/CEP17 signals 1.17 and average HER2 copies per cell 2.45.  On 02/21/2018 the patient had bilateral breast MRI.  This measured the upper outer quadrant right breast mass at 2.3 cm including an area of surrounding non-masslike enhancement.  There was no evidence of multifocal or multicentric disease, no lymphadenopathy, and no findings in the left breast.  The patient's subsequent history is as detailed below.    PAST MEDICAL HISTORY: Past Medical History:  Diagnosis Date  . Blood dyscrasia    hx blood clott superficial in rt arm  . Cancer [facility]) 2019   right breast cancer  . Family history of breast cancer   . Heart murmur   . Hypertension   She used to have migraines. She denies GERD, asthma, emphysema. She was told that she had a slight heart murmur. She denies heart palpitations.  PAST SURGICAL HISTORY: Past Surgical History:  Procedure Laterality Date  . BREAST LUMPECTOMY WITH RADIOACTIVE SEED AND SENTINEL LYMPH NODE BIOPSY Right 03/18/2018    Procedure: RIGHT BREAST PARTIAL MASTECTOMY WITH RADIOACTIVE SEED AND SENTINEL LYMPH NODE BIOPSY;  Surgeon: [name], MD;  Location: [facility];  Service: General;  Laterality: Right;  . cesearean section     tubal ligation   . PORTACATH PLACEMENT Left 05/27/2018   Procedure: INSERTION PORT-A-CATH;  Surgeon: [name], MD;  Location: [place];  Service: General;  Laterality: Left;  . TUBAL LIGATION      2 Caesarian sections- tubal ligation at the second c-section.  FAMILY HISTORY Family History  Problem Relation Age of Onset  . Breast cancer Sister 39       approximate  . Cancer Father 26       started in shoulder, spread to spine, brain, lung  . Cancer Paternal [name]        'blood cancer'  . Heart attack Paternal Uncle   . Breast cancer Cousin        age dx unk  . Breast cancer Cousin        age dx unk  . Breast cancer Cousin        age dx unk   The patient's father passed away in the summer 2018 at the age of 36 due to metastatic lung cancer. He was a previous smoker. The patient's mother is alive at age 54. The patient has 1 brother and 1 sister. The patient's sister was diagnosed with breast cancer at the age of 65.  The patient has 3 paternal cousins diagnosed with breast cancer. She denies a family history of ovarian cancer.    GYNECOLOGIC HISTORY:  The patient's periods currently are about twice a month.   Menarche: 52 years old. She used to be on a dance team.  Age at first live birth: 52 years old She is [place] P2. She was taking oral contraceptives, but this has been discontinued as of January 2019. She had bilateral tubal ligation at the time of her second c-section.    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  She has a PhD.  Her husband, [name], is a Chief Strategy Officer for [employer], making metal parts. He travels about 2-3 times per year. Their sons are [name] age 41, and [name] age 104. She attends [employer].     ADVANCED DIRECTIVES:    HEALTH MAINTENANCE: Social History   Tobacco Use  . Smoking status: Never Smoker  . Smokeless tobacco: Never Used  Substance Use Topics  . Alcohol use: Yes    Comment: occ  . Drug use: Never     Colonoscopy:n/a  PAP: 2017/ normal  Bone density: n/a   Allergies  Allergen Reactions  . Latex Rash    Red  rash    Current Outpatient Medications  Medication Sig Dispense Refill  . acetaminophen (TYLENOL) 500 MG tablet Take 1,000 mg by mouth every 6 (six) hours as needed (for pain.).    Marland Kitchen dexamethasone (DECADRON) 4 MG tablet Take 2 tablets by mouth once a day on the day after chemotherapy and then take 2 tablets two times a day for 2 days. Take with food. 30 tablet 1  . ibuprofen (ADVIL,MOTRIN) 200 MG tablet Take 400 mg by mouth every 8 (eight) hours as needed (for pain.).    Marland Kitchen lidocaine-prilocaine (EMLA) cream Apply to affected area  once 30 g 3  . lisinopril-hydrochlorothiazide (PRINZIDE,ZESTORETIC) 10-12.5 MG tablet Take 1 tablet by mouth daily.    Marland Kitchen LORazepam (ATIVAN) 0.5 MG tablet Take 1 tablet (0.5 mg total) by mouth at bedtime as needed (Nausea or vomiting). 30 tablet 0  . oxyCODONE (OXY IR/ROXICODONE) 5 MG immediate release tablet Take 1 tablet (5 mg total) by mouth every 6 (six) hours as needed for moderate pain or severe pain. 15 tablet 0  . prochlorperazine (COMPAZINE) 10 MG tablet Take 1 tablet (10 mg total) by mouth every 6 (six) hours as needed (Nausea or vomiting). 30 tablet 1   No current facility-administered medications for this visit.     OBJECTIVE:  Vitals:   08/09/18 0914  BP: 119/72  Pulse: 68  Resp: 18  Temp: 98.6 F (37 C)  SpO2: 100%     Body mass index is 34.11 kg/m.   Wt Readings from Last 3 Encounters:  08/09/18 198 lb 11.2 oz (90.1 kg)  07/03/18 190 lb 1.6 oz (86.2 kg)  06/12/18 191 lb 8 oz (86.9 kg)   ECOG FS:1 - Symptomatic but completely ambulatory GENERAL: Patient is a well appearing  female in no acute distress HEENT:  Sclerae anicteric.  Oropharynx clear and moist. No ulcerations or evidence of oropharyngeal candidiasis. Neck is supple.  NODES:  No cervical, supraclavicular, or axillary lymphadenopathy palpated.  BREAST EXAM:  Deferred. LUNGS:  Clear to auscultation bilaterally.  No wheezes or rhonchi. HEART:  Regular rate and rhythm. No murmur appreciated. ABDOMEN:  Soft, nontender.  Positive, normoactive bowel sounds. No organomegaly palpated. MSK:  No focal spinal tenderness to palpation. Full range of motion bilaterally in the upper extremities. EXTREMITIES:  No peripheral edema.   SKIN:  Clear with no obvious rashes or skin changes. No nail dyscrasia. NEURO:  Nonfocal. Well oriented.  Appropriate affect.     LAB RESULTS:  CMP     Component Value Date/Time   NA 138 07/23/2018 0900   K 3.8 07/23/2018 0900   CL 103 07/23/2018 0900   CO2 24 07/23/2018 0900   GLUCOSE 90 07/23/2018 0900   BUN 12 07/23/2018 0900   CREATININE 0.84 07/23/2018 0900   CALCIUM 9.1 07/23/2018 0900   PROT 6.6 07/23/2018 0900   ALBUMIN 3.8 07/23/2018 0900   AST 15 07/23/2018 0900   ALT 17 07/23/2018 0900   ALKPHOS 67 07/23/2018 0900   BILITOT 0.4 07/23/2018 0900   GFRNONAA >60 07/23/2018 0900   GFRAA >60 07/23/2018 0900    No results found for: TOTALPROTELP, ALBUMINELP, A1GS, A2GS, BETS, BETA2SER, GAMS, MSPIKE, SPEI  No results found for: KPAFRELGTCHN, LAMBDASER, KAPLAMBRATIO  Lab Results  Component Value Date   WBC 4.6 08/09/2018   NEUTROABS 3.2 08/09/2018   HGB 9.6 (L) 08/09/2018   HCT 28.7 (L) 08/09/2018   MCV 96.6 08/09/2018   PLT 394 08/09/2018    @LASTCHEMISTRY @  No results found for: LABCA2  No components found for: VCBSWH675  No results for input(s): INR in the last 168 hours.  No results found for: LABCA2  No results found for: FFM384  No results found for: YKZ993  No results found for: TTS177  No results found for: CA2729  No components found  for: HGQUANT  No results found for: CEA1 / No results found for: CEA1   No results found for: AFPTUMOR  No results found for: CHROMOGRNA  No results found for: PSA1  Appointment on 08/09/2018  Component Date Value Ref Range Status  . WBC Count  08/09/2018 4.6  3.9 - 10.3 K/uL Final  . RBC 08/09/2018 2.97* 3.70 - 5.45 MIL/uL Final  . Hemoglobin 08/09/2018 9.6* 11.6 - 15.9 g/dL Final  . HCT 08/09/2018 28.7* 34.8 - 46.6 % Final  . MCV 08/09/2018 96.6  79.5 - 101.0 fL Final  . MCH 08/09/2018 32.3  25.1 - 34.0 pg Final  . MCHC 08/09/2018 33.4  31.5 - 36.0 g/dL Final  . RDW 08/09/2018 16.6* 11.2 - 14.5 % Final  . Platelet Count 08/09/2018 394  145 - 400 K/uL Final  . Neutrophils Relative % 08/09/2018 69  % Final  . Neutro Abs 08/09/2018 3.2  1.5 - 6.5 K/uL Final  . Lymphocytes Relative 08/09/2018 15  % Final  . Lymphs Abs 08/09/2018 0.7* 0.9 - 3.3 K/uL Final  . Monocytes Relative 08/09/2018 15  % Final  . Monocytes Absolute 08/09/2018 0.7  0.1 - 0.9 K/uL Final  . Eosinophils Relative 08/09/2018 0  % Final  . Eosinophils Absolute 08/09/2018 0.0  0.0 - 0.5 K/uL Final  . Basophils Relative 08/09/2018 1  % Final  . Basophils Absolute 08/09/2018 0.0  0.0 - 0.1 K/uL Final   Performed at [facility], [address] [place], [place] [address]    (this displays the last labs from the last 3 days)  No results found for: TOTALPROTELP, ALBUMINELP, A1GS, A2GS, BETS, BETA2SER, GAMS, MSPIKE, SPEI (this displays SPEP labs)  No results found for: KPAFRELGTCHN, LAMBDASER, KAPLAMBRATIO (kappa/lambda light chains)  No results found for: HGBA, HGBA2QUANT, HGBFQUANT, HGBSQUAN (Hemoglobinopathy evaluation)   No results found for: LDH  No results found for: IRON, TIBC, IRONPCTSAT (Iron and TIBC)  No results found for: FERRITIN  Urinalysis    Component Value Date/Time   COLORURINE YELLOW 06/25/2018 0815   APPEARANCEUR HAZY (A) 06/25/2018 0815   LABSPEC 1.019  06/25/2018 0815   PHURINE 6.0 06/25/2018 0815   GLUCOSEU NEGATIVE 06/25/2018 0815   HGBUR NEGATIVE 06/25/2018 0815   BILIRUBINUR NEGATIVE 06/25/2018 0815   KETONESUR NEGATIVE 06/25/2018 0815   PROTEINUR NEGATIVE 06/25/2018 0815   NITRITE NEGATIVE 06/25/2018 0815   LEUKOCYTESUR NEGATIVE 06/25/2018 0815     STUDIES: No results found.  ELIGIBLE FOR AVAILABLE RESEARCH PROTOCOL: BCEP   ASSESSMENT: 52 y.o. [place], [place] woman status post right breast upper outer quadrant biopsy 02/07/2018 for a clinical T1c-T2 N0, stage IA-B invasive ductal carcinoma, grade 2, estrogen receptor and progesterone receptor positive, HER-2 not amplified, with an MIB-1 of 10%  (1) status post right lumpectomy 03/18/2018 for a pT1c pN0, stage IA invasive ductal carcinoma, grade 2, estrogen and progesterone receptor positive, with negative margins.  A total of 1 lymph node was removed  (2) Mammaprint on 03/18/2018 showed high risk, indicating that with chemotherapy and hormone therapy, the patient would have a nearly 95% chance of having no distant metastases within the next 5 years  (3) chemotherapy will consist of doxorubicin and cyclophosphamide in dose dense fashion x4 starting 06/04/2018, to be followed by weekly paclitaxel x12  (a) baseline echocardiogram on 05/23/2018 showed an ejection fraction in the 65-75% range  (4) adjuvant radiation to follow  (5) antiestrogens to follow at the completion of local treatment  (6) genetics testing 03/13/2018 through the Common Hereditary Cancer Panel offered by [facility] ifound no deleterious mutations in APC, ATM, AXIN2, BARD1, BMPR1A, BRCA1, BRCA2, BRIP1, CDH1, CDKN2A (p14ARF), CDKN2A (p16INK4a), CKD4, CHEK2, CTNNA1, DICER1, EPCAM (Deletion/duplication testing only), GREM1 (promoter region deletion/duplication testing only), KIT, MEN1, MLH1, MSH2, MSH3, MSH6, MUTYH,  NBN, NF1, NHTL1, PALB2, PDGFRA, PMS2, POLD1, POLE, PTEN, RAD50, RAD51C, RAD51D, SDHB, SDHC, SDHD, SMAD4,  SMARCA4. STK11, TP53, TSC1, TSC2, and VHL.  The following genes were evaluated for sequence changes only: SDHA and HOXB13 c.251G>A variant only.  (a)  A variant of uncertain significance (VUS) in the gene MSH3 was also identified c.1027+4T>C ([employer]).    PLAN:  Denay is doing well today.  Her CBC is stable, and she will proceed with her first cycle of Paclitaxel (so long as CMET is within parameters).  I reviewed this with her in detail.  We discussed possible side effects she may experience with the Paclitaxel, particularly neuropathy and first dose hypersensitivity reaction in detail.    I reviewed the issue of the missed appointments.  It appears she no showed to one appointment and canceled the other.  I talked to her today about how her appointments are set up from here on out and that we will need to see her with her first Paclitaxel, Second Paclitaxel and then every other paclitaxel from there on out.  She and her husband verbalized understanding.  Her appointments for this regimen are scheduled, and my nurse printed her a handout of her appointments and personally gave it to her.    She knows to call for any problems that may develop before the next visit.  A total of (30) minutes of face-to-face time was spent with this patient with greater than 50% of that time in counseling and care-coordination.   Wilber Bihari, NP 08/09/18 9:28 AM Medical Oncology and Hematology [facility] [address] [place], [place] [address] Tel. [phone]    Fax. [phone]

## 2018-08-15 ENCOUNTER — Other Ambulatory Visit: Payer: Self-pay | Admitting: Oncology

## 2018-08-16 ENCOUNTER — Inpatient Hospital Stay (HOSPITAL_BASED_OUTPATIENT_CLINIC_OR_DEPARTMENT_OTHER): Payer: BC Managed Care – PPO | Admitting: Oncology

## 2018-08-16 ENCOUNTER — Inpatient Hospital Stay: Payer: BC Managed Care – PPO | Attending: Oncology

## 2018-08-16 ENCOUNTER — Inpatient Hospital Stay: Payer: BC Managed Care – PPO

## 2018-08-16 VITALS — BP 113/82 | HR 80 | Temp 99.3°F | Resp 16

## 2018-08-16 VITALS — BP 101/66 | HR 90 | Temp 99.2°F | Resp 18 | Ht 64.0 in | Wt 185.8 lb

## 2018-08-16 DIAGNOSIS — Z170 Estrogen receptor positive status [ER+]: Principal | ICD-10-CM

## 2018-08-16 DIAGNOSIS — Z95828 Presence of other vascular implants and grafts: Secondary | ICD-10-CM

## 2018-08-16 DIAGNOSIS — Z452 Encounter for adjustment and management of vascular access device: Secondary | ICD-10-CM | POA: Diagnosis not present

## 2018-08-16 DIAGNOSIS — C50411 Malignant neoplasm of upper-outer quadrant of right female breast: Secondary | ICD-10-CM | POA: Diagnosis present

## 2018-08-16 DIAGNOSIS — R0602 Shortness of breath: Secondary | ICD-10-CM | POA: Diagnosis not present

## 2018-08-16 DIAGNOSIS — Z5111 Encounter for antineoplastic chemotherapy: Secondary | ICD-10-CM | POA: Insufficient documentation

## 2018-08-16 LAB — COMPREHENSIVE METABOLIC PANEL
ALT: 42 U/L (ref 0–44)
ANION GAP: 10 (ref 5–15)
AST: 29 U/L (ref 15–41)
Albumin: 3.9 g/dL (ref 3.5–5.0)
Alkaline Phosphatase: 51 U/L (ref 38–126)
BUN: 16 mg/dL (ref 6–20)
CALCIUM: 9.6 mg/dL (ref 8.9–10.3)
CHLORIDE: 103 mmol/L (ref 98–111)
CO2: 27 mmol/L (ref 22–32)
Creatinine, Ser: 0.92 mg/dL (ref 0.44–1.00)
Glucose, Bld: 118 mg/dL — ABNORMAL HIGH (ref 70–99)
Potassium: 3.5 mmol/L (ref 3.5–5.1)
SODIUM: 140 mmol/L (ref 135–145)
Total Bilirubin: 0.4 mg/dL (ref 0.3–1.2)
Total Protein: 6.9 g/dL (ref 6.5–8.1)

## 2018-08-16 LAB — CBC WITH DIFFERENTIAL/PLATELET
Basophils Absolute: 0 10*3/uL (ref 0.0–0.1)
Basophils Relative: 0 %
EOS ABS: 0.1 10*3/uL (ref 0.0–0.5)
Eosinophils Relative: 2 %
HCT: 27.7 % — ABNORMAL LOW (ref 34.8–46.6)
Hemoglobin: 9.6 g/dL — ABNORMAL LOW (ref 11.6–15.9)
Lymphocytes Relative: 23 %
Lymphs Abs: 0.9 10*3/uL (ref 0.9–3.3)
MCH: 33.2 pg (ref 25.1–34.0)
MCHC: 34.7 g/dL (ref 31.5–36.0)
MCV: 95.7 fL (ref 79.5–101.0)
MONO ABS: 0.5 10*3/uL (ref 0.1–0.9)
MONOS PCT: 13 %
NEUTROS ABS: 2.3 10*3/uL (ref 1.5–6.5)
NEUTROS PCT: 62 %
Platelets: 367 10*3/uL (ref 145–400)
RBC: 2.9 MIL/uL — ABNORMAL LOW (ref 3.70–5.45)
RDW: 17.1 % — ABNORMAL HIGH (ref 11.2–14.5)
WBC: 3.7 10*3/uL — ABNORMAL LOW (ref 3.9–10.3)

## 2018-08-16 MED ORDER — SODIUM CHLORIDE 0.9% FLUSH
10.0000 mL | INTRAVENOUS | Status: DC | PRN
  Administered 2018-08-16: 10 mL
  Filled 2018-08-16: qty 10

## 2018-08-16 MED ORDER — DEXAMETHASONE SODIUM PHOSPHATE 10 MG/ML IJ SOLN
10.0000 mg | Freq: Once | INTRAMUSCULAR | Status: AC
  Administered 2018-08-16: 10 mg via INTRAVENOUS

## 2018-08-16 MED ORDER — SODIUM CHLORIDE 0.9 % IV SOLN: 10.0000 mg | Freq: Once | INTRAVENOUS | Status: DC

## 2018-08-16 MED ORDER — HEPARIN SOD (PORK) LOCK FLUSH 100 UNIT/ML IV SOLN
500.0000 [IU] | Freq: Once | INTRAVENOUS | Status: AC | PRN
  Administered 2018-08-16: 500 [IU]
  Filled 2018-08-16: qty 5

## 2018-08-16 MED ORDER — FAMOTIDINE IN NACL 20-0.9 MG/50ML-% IV SOLN
INTRAVENOUS | Status: AC
  Filled 2018-08-16: qty 50

## 2018-08-16 MED ORDER — FLUCONAZOLE 100 MG PO TABS: 100.0000 mg | ORAL_TABLET | Freq: Every day | ORAL | 0 refills | Status: DC

## 2018-08-16 MED ORDER — FAMOTIDINE IN NACL 20-0.9 MG/50ML-% IV SOLN
20.0000 mg | Freq: Once | INTRAVENOUS | Status: AC
  Administered 2018-08-16: 20 mg via INTRAVENOUS

## 2018-08-16 MED ORDER — DIPHENHYDRAMINE HCL 50 MG/ML IJ SOLN
INTRAMUSCULAR | Status: AC
  Filled 2018-08-16: qty 1

## 2018-08-16 MED ORDER — DIPHENHYDRAMINE HCL 50 MG/ML IJ SOLN
25.0000 mg | Freq: Once | INTRAMUSCULAR | Status: AC
  Administered 2018-08-16: 25 mg via INTRAVENOUS

## 2018-08-16 MED ORDER — SODIUM CHLORIDE 0.9 % IV SOLN
80.0000 mg/m2 | Freq: Once | INTRAVENOUS | Status: AC
  Administered 2018-08-16: 156 mg via INTRAVENOUS
  Filled 2018-08-16: qty 26

## 2018-08-16 MED ORDER — SODIUM CHLORIDE 0.9 % IV SOLN
Freq: Once | INTRAVENOUS | Status: AC
  Administered 2018-08-16: 14:00:00 via INTRAVENOUS
  Filled 2018-08-16: qty 250

## 2018-08-16 MED ORDER — DEXAMETHASONE SODIUM PHOSPHATE 10 MG/ML IJ SOLN
INTRAMUSCULAR | Status: AC
  Filled 2018-08-16: qty 1

## 2018-08-16 NOTE — Patient Instructions (Signed)
[facility] Discharge Instructions for Patients Receiving Chemotherapy  Today you received the following chemotherapy agents paclitaxel (Taxol) To help prevent nausea and vomiting after your treatment, we encourage you to take your nausea medication as directed   If you develop nausea and vomiting that is not controlled by your nausea medication, call the clinic.   BELOW ARE SYMPTOMS THAT SHOULD BE REPORTED IMMEDIATELY:  *FEVER GREATER THAN 100.5 F  *CHILLS WITH OR WITHOUT FEVER  NAUSEA AND VOMITING THAT IS NOT CONTROLLED WITH YOUR NAUSEA MEDICATION  *UNUSUAL SHORTNESS OF BREATH  *UNUSUAL BRUISING OR BLEEDING  TENDERNESS IN MOUTH AND THROAT WITH OR WITHOUT PRESENCE OF ULCERS  *URINARY PROBLEMS  *BOWEL PROBLEMS  UNUSUAL RASH Items with * indicate a potential emergency and should be followed up as soon as possible.  Feel free to call the clinic should you have any questions or concerns. The clinic phone number is [phone].  Please show the CHEMO ALERT CARD at check-in to the Emergency Department and triage nurse.   

## 2018-08-16 NOTE — Progress Notes (Signed)
[place]  [phone] [phone]     ID: [name] DOB: Nov 23, 1973  MR#: [id number]  UVJ#:[id number]  Patient Care Team: Filiberto Pinks as PCP - General (Physician Assistant) Magrinat, Virgie Dad, MD as Consulting Physician (Oncology) Kyung Rudd, MD as Consulting Physician (Radiation Oncology) [name], MD as Consulting Physician (General Surgery) Janyth Pupa, DO as Consulting Physician (Obstetrics and Gynecology) Janyth Pupa, DO as Consulting Physician (Obstetrics and Gynecology) OTHER MD:  CHIEF COMPLAINT: Estrogen receptor positive breast cancer  CURRENT TREATMENT: Adjuvant chemotherapy  INTERVAL HISTORY: Darsha returns today for follow up and treatment of her estrogen receptor positive breast cancer accompanied by her husband.  She has received four cycles of Doxoribucin and Cyclophosphamide, followed by weekly paclitaxel, with the first dose 08/09/2018.  Today is day 1 cycle 2. After receiving cycle 1, she had redness and itching around her port. She received Benadryl that day and slept well that night. When she woke up in the morning on day 2, she had a fever of 100.1 and facial flushing which resolved that day. She also felt fatigued. She denies having nausea, and did not take Compazine. With her prior chemotherapy, she had nausea for 4-5 days. She denies peripheral neuropathy or mouth sores.    REVIEW OF SYSTEMS: Anna Conrad reports that she has a yeast rash in between her breast. She also has a vaginal yeast infection. She denies unusual headaches, visual changes, nausea, vomiting, or dizziness. There has been no unusual cough, phlegm production, or pleurisy. There has been no change in bowel or bladder habits. She denies unexplained fatigue or unexplained weight loss or bleeding. A detailed review of systems was otherwise stable.    HISTORY OF CURRENT ILLNESS: From the original intake note:  Ryna Temeca Somma  had routine screening mammography on 01/29/2018 showing a possible mass with associated calcifications in the right breast. She underwent unilateral right diagnostic mammography with tomography and right breast ultrasonography at [facility] on 02/05/2018 showing a highly suspicious mass over the 10:30 position upper outer of the right breast located 6 cm from the nipple measuring 1.3 x 1.4 x 1.5 cm. Associated microcalcifications corresponding to the mammographic abnormality. Ultrasound of the right axilla was normal.  Accordingly on 02/29/2019 she proceeded to biopsy of the right breast area in question. 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 Prognostic indicators significant for: estrogen receptor, 100% positive with strong staining intensity and progesterone receptor, 20% positive iwht moderate staining intensity. Proliferation marker Ki67 at 10%. HER2 not amplified with ratios HER2/CEP17 signals 1.17 and average HER2 copies per cell 2.45.  On 02/21/2018 the patient had bilateral breast MRI.  This measured the upper outer quadrant right breast mass at 2.3 cm including an area of surrounding non-masslike enhancement.  There was no evidence of multifocal or multicentric disease, no lymphadenopathy, and no findings in the left breast.  The patient's subsequent history is as detailed below.    PAST MEDICAL HISTORY: Past Medical History:  Diagnosis Date  . Blood dyscrasia    hx blood clott superficial in rt arm  . Cancer [facility]) 2019   right breast cancer  . Family history of breast cancer   . Heart murmur   . Hypertension   She used to have migraines. She denies GERD, asthma, emphysema. She was told that she had a slight heart murmur. She denies heart palpitations.   PAST SURGICAL HISTORY: Past Surgical History:  Procedure Laterality Date  . BREAST LUMPECTOMY WITH RADIOACTIVE SEED AND SENTINEL LYMPH NODE BIOPSY  Right 03/18/2018   Procedure: RIGHT BREAST PARTIAL MASTECTOMY WITH RADIOACTIVE SEED AND SENTINEL LYMPH NODE BIOPSY;  Surgeon: [name], MD;  Location: [place];  Service: General;  Laterality: Right;  . cesearean section     tubal ligation   . PORTACATH PLACEMENT Left 05/27/2018   Procedure: INSERTION PORT-A-CATH;  Surgeon: [name], MD;  Location: [place];  Service: General;  Laterality: Left;  . TUBAL LIGATION      2 Caesarian sections- tubal ligation at the second c-section.  FAMILY HISTORY Family History  Problem Relation Age of Onset  . Breast cancer Sister 27       approximate  . Cancer Father 53       started in shoulder, spread to spine, brain, lung  . Cancer Paternal [name]        'blood cancer'  . Heart attack Paternal Uncle   . Breast cancer Cousin        age dx unk  . Breast cancer Cousin        age dx unk  . Breast cancer Cousin        age dx unk   The patient's father passed away in the summer 2018 at the age of 95 due to metastatic lung cancer. He was a previous smoker. The patient's mother is alive at age 50. The patient has 1 brother and 1 sister. The patient's sister was diagnosed with breast cancer at the age of 34.  The patient has 3 paternal cousins diagnosed with breast cancer. She denies a family history of ovarian cancer.    GYNECOLOGIC HISTORY:  The patient's periods currently are about twice a month.   Menarche: 52 years old. She used to be on a dance team.  Age at first live birth: 52 years old She is [place] P2. She was taking oral contraceptives, but this has been discontinued as of January 2019. She had bilateral tubal ligation at the time of her second c-section.    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  She has a PhD.  Her husband, [name], is a Chief Strategy Officer for [employer], making metal parts. He travels about 2-3 times per year. Their sons are [name] age 49, and [name] age 62. She attends  [employer].     ADVANCED DIRECTIVES:    HEALTH MAINTENANCE: Social History   Tobacco Use  . Smoking status: Never Smoker  . Smokeless tobacco: Never Used  Substance Use Topics  . Alcohol use: Yes    Comment: occ  . Drug use: Never     Colonoscopy:n/a  PAP: 2017/ normal  Bone density: n/a   Allergies  Allergen Reactions  . Latex Rash    Red  rash    Current Outpatient Medications  Medication Sig Dispense Refill  . acetaminophen (TYLENOL) 500 MG tablet Take 1,000 mg by mouth every 6 (six) hours as needed (for pain.).    Marland Kitchen fluconazole (DIFLUCAN) 100 MG tablet Take 1 tablet (100 mg total) by mouth daily. 30 tablet 0  . ibuprofen (ADVIL,MOTRIN) 200 MG tablet Take 400 mg by mouth every 8 (eight) hours as needed (for pain.).    Marland Kitchen lidocaine-prilocaine (EMLA) cream Apply to affected area once 30 g 3  . lisinopril-hydrochlorothiazide (PRINZIDE,ZESTORETIC) 10-12.5 MG tablet Take 1 tablet by mouth daily.    Marland Kitchen LORazepam (ATIVAN) 0.5 MG tablet  Take 1 tablet (0.5 mg total) by mouth at bedtime as needed (Nausea or vomiting). 30 tablet 0  . miconazole (MONISTAT 7) 2 % vaginal cream Place 1 Applicatorful vaginally at bedtime. 45 g 0  . prochlorperazine (COMPAZINE) 10 MG tablet Take 1 tablet (10 mg total) by mouth every 6 (six) hours as needed (Nausea or vomiting). 30 tablet 1   No current facility-administered medications for this visit.     OBJECTIVE: Middle-aged white woman who appears well  Vitals:   08/16/18 1326  BP: 101/66  Pulse: 90  Resp: 18  Temp: 99.2 F (37.3 C)  SpO2: 100%     Body mass index is 31.89 kg/m.   Wt Readings from Last 3 Encounters:  08/16/18 185 lb 12.8 oz (84.3 kg)  08/09/18 198 lb 11.2 oz (90.1 kg)  07/03/18 190 lb 1.6 oz (86.2 kg)   ECOG FS:1 - Symptomatic but completely ambulatory  Sclerae unicteric, EOMs intact Oropharynx clear and moist No cervical or supraclavicular adenopathy Lungs no rales or rhonchi Heart  regular rate and rhythm Abd soft, nontender, positive bowel sounds MSK no focal spinal tenderness, no upper extremity lymphedema Neuro: nonfocal, well oriented, appropriate affect Breasts: The right breast is status post lumpectomy.  There is no evidence of residual or recurrent disease.  The left breast is benign.  Both axillae are benign.     LAB RESULTS:  CMP     Component Value Date/Time   NA 140 08/16/2018 1236   K 3.5 08/16/2018 1236   CL 103 08/16/2018 1236   CO2 27 08/16/2018 1236   GLUCOSE 118 (H) 08/16/2018 1236   BUN 16 08/16/2018 1236   CREATININE 0.92 08/16/2018 1236   CREATININE 0.83 08/09/2018 0857   CALCIUM 9.6 08/16/2018 1236   PROT 6.9 08/16/2018 1236   ALBUMIN 3.9 08/16/2018 1236   AST 29 08/16/2018 1236   AST 18 08/09/2018 0857   ALT 42 08/16/2018 1236   ALT 16 08/09/2018 0857   ALKPHOS 51 08/16/2018 1236   BILITOT 0.4 08/16/2018 1236   BILITOT 0.3 08/09/2018 0857   GFRNONAA >60 08/16/2018 1236   GFRNONAA >60 08/09/2018 0857   GFRAA >60 08/16/2018 1236   GFRAA >60 08/09/2018 0857    No results found for: TOTALPROTELP, ALBUMINELP, A1GS, A2GS, BETS, BETA2SER, GAMS, MSPIKE, SPEI  No results found for: KPAFRELGTCHN, LAMBDASER, KAPLAMBRATIO  Lab Results  Component Value Date   WBC 3.7 (L) 08/16/2018   NEUTROABS 2.3 08/16/2018   HGB 9.6 (L) 08/16/2018   HCT 27.7 (L) 08/16/2018   MCV 95.7 08/16/2018   PLT 367 08/16/2018    _0 @  No results found for: LABCA2  No components found for: VBTYOM600  No results for input(s): INR in the last 168 hours.  No results found for: LABCA2  No results found for: KHT977  No results found for: SFS239  No results found for: RVU023  No results found for: CA2729  No components found for: HGQUANT  No results found for: CEA1 / No results found for: CEA1   No results found for: AFPTUMOR  No results found for: CHROMOGRNA  No results found for: PSA1  Appointment on 08/16/2018  Component  Date Value Ref Range Status  . WBC 08/16/2018 3.7* 3.9 - 10.3 K/uL Final  . RBC 08/16/2018 2.90* 3.70 - 5.45 MIL/uL Final  . Hemoglobin 08/16/2018 9.6* 11.6 - 15.9 g/dL Final  . HCT 08/16/2018 27.7* 34.8 - 46.6 % Final  . MCV 08/16/2018 95.7  79.5 - 101.0  fL Final  . MCH 08/16/2018 33.2  25.1 - 34.0 pg Final  . MCHC 08/16/2018 34.7  31.5 - 36.0 g/dL Final  . RDW 08/16/2018 17.1* 11.2 - 14.5 % Final  . Platelets 08/16/2018 367  145 - 400 K/uL Final  . Neutrophils Relative % 08/16/2018 62  % Final  . Neutro Abs 08/16/2018 2.3  1.5 - 6.5 K/uL Final  . Lymphocytes Relative 08/16/2018 23  % Final  . Lymphs Abs 08/16/2018 0.9  0.9 - 3.3 K/uL Final  . Monocytes Relative 08/16/2018 13  % Final  . Monocytes Absolute 08/16/2018 0.5  0.1 - 0.9 K/uL Final  . Eosinophils Relative 08/16/2018 2  % Final  . Eosinophils Absolute 08/16/2018 0.1  0.0 - 0.5 K/uL Final  . Basophils Relative 08/16/2018 0  % Final  . Basophils Absolute 08/16/2018 0.0  0.0 - 0.1 K/uL Final   Performed at [facility], [place] [address], [place], [place] [address]  . Sodium 08/16/2018 140  135 - 145 mmol/L Final  . Potassium 08/16/2018 3.5  3.5 - 5.1 mmol/L Final  . Chloride 08/16/2018 103  98 - 111 mmol/L Final  . CO2 08/16/2018 27  22 - 32 mmol/L Final  . Glucose, Bld 08/16/2018 118* 70 - 99 mg/dL Final  . BUN 08/16/2018 16  6 - 20 mg/dL Final  . Creatinine, Ser 08/16/2018 0.92  0.44 - 1.00 mg/dL Final  . Calcium 08/16/2018 9.6  8.9 - 10.3 mg/dL Final  . Total Protein 08/16/2018 6.9  6.5 - 8.1 g/dL Final  . Albumin 08/16/2018 3.9  3.5 - 5.0 g/dL Final  . AST 08/16/2018 29  15 - 41 U/L Final  . ALT 08/16/2018 42  0 - 44 U/L Final  . Alkaline Phosphatase 08/16/2018 51  38 - 126 U/L Final  . Total Bilirubin 08/16/2018 0.4  0.3 - 1.2 mg/dL Final  . GFR calc non Af Amer 08/16/2018 >60  >60 mL/min Final  . GFR calc Af Amer 08/16/2018 >60  >60 mL/min Final   Comment: (NOTE) The eGFR has been calculated  using the CKD EPI equation. This calculation has not been validated in all clinical situations. eGFR's persistently <60 mL/min signify possible Chronic Kidney Disease.   Georgiann Hahn gap 08/16/2018 10  5 - 15 Final   Performed at [facility], [address] [place], [place] [address]    (this displays the last labs from the last 3 days)  No results found for: TOTALPROTELP, ALBUMINELP, A1GS, A2GS, BETS, BETA2SER, GAMS, MSPIKE, SPEI (this displays SPEP labs)  No results found for: KPAFRELGTCHN, LAMBDASER, KAPLAMBRATIO (kappa/lambda light chains)  No results found for: HGBA, HGBA2QUANT, HGBFQUANT, HGBSQUAN (Hemoglobinopathy evaluation)   No results found for: LDH  No results found for: IRON, TIBC, IRONPCTSAT (Iron and TIBC)  No results found for: FERRITIN  Urinalysis    Component Value Date/Time   COLORURINE YELLOW 06/25/2018 0815   APPEARANCEUR HAZY (A) 06/25/2018 0815   LABSPEC 1.019 06/25/2018 0815   PHURINE 6.0 06/25/2018 0815   GLUCOSEU NEGATIVE 06/25/2018 0815   HGBUR NEGATIVE 06/25/2018 0815   BILIRUBINUR NEGATIVE 06/25/2018 0815   KETONESUR NEGATIVE 06/25/2018 0815   PROTEINUR NEGATIVE 06/25/2018 0815   NITRITE NEGATIVE 06/25/2018 0815   LEUKOCYTESUR NEGATIVE 06/25/2018 0815     STUDIES: No results found.  ELIGIBLE FOR AVAILABLE RESEARCH PROTOCOL: BCEP   ASSESSMENT: 52 y.o. [place], [place] woman status post right breast upper outer quadrant biopsy 02/07/2018 for a clinical T1c-T2  N0, stage IA-B invasive ductal carcinoma, grade 2, estrogen receptor and progesterone receptor positive, HER-2 not amplified, with an MIB-1 of 10%  (1) status post right lumpectomy 03/18/2018 for a pT1c pN0, stage IA invasive ductal carcinoma, grade 2, estrogen and progesterone receptor positive, with negative margins.  A total of 1 lymph node was removed  (2) Mammaprint on 03/18/2018 showed high risk, indicating that with chemotherapy and hormone therapy, the patient  would have a nearly 95% chance of having no distant metastases within the next 5 years  (3) chemotherapy will consist of doxorubicin and cyclophosphamide in dose dense fashion x4 starting 06/04/2018, followed by weekly paclitaxel x12 started 08/09/2018.  (a) baseline echocardiogram on 05/23/2018 showed an ejection fraction in the 65-75% range  (4) adjuvant radiation to follow  (5) antiestrogens to follow at the completion of local treatment  (6) genetics testing 03/13/2018 through the Common Hereditary Cancer Panel offered by [facility] ifound no deleterious mutations in APC, ATM, AXIN2, BARD1, BMPR1A, BRCA1, BRCA2, BRIP1, CDH1, CDKN2A (p14ARF), CDKN2A (p16INK4a), CKD4, CHEK2, CTNNA1, DICER1, EPCAM (Deletion/duplication testing only), GREM1 (promoter region deletion/duplication testing only), KIT, MEN1, MLH1, MSH2, MSH3, MSH6, MUTYH, NBN, NF1, NHTL1, PALB2, PDGFRA, PMS2, POLD1, POLE, PTEN, RAD50, RAD51C, RAD51D, SDHB, SDHC, SDHD, SMAD4, SMARCA4. STK11, TP53, TSC1, TSC2, and VHL.  The following genes were evaluated for sequence changes only: SDHA and HOXB13 c.251G>A variant only.  (a)  A variant of uncertain significance (VUS) in the gene MSH3 was also identified c.1027+4T>C (Intronic).    PLAN:  Janeth did quite well with her first dose of Taxol.  She had a mild reaction.  I am not going to give her the same 20 mg of Decadron she received the first time.  That caused her to get flushed at home the next day and to develop a yeast infection.  I am dropping the Decadron to 10 mg now and to 4 mg next week and we will leave it at 4 mg from that point on.  This is my usual practice.  I do think she has a yeast infection at present and I wrote for Diflucan for her to take in the next 3 to 5 days.  If she develops a yeast infection again in the future she can repeat  She already has her next treatment appointment and also her next visit appointments.  We are going to see her every other treatment until she  gets to treatment #8 at which point we started getting a little bit more concerned regarding neuropathy and we will see her every treatment until she finishes.  She has a good understanding of this plan.  She knows to call for any other issues that may develop before the next visit.   Magrinat, Virgie Dad, MD  08/16/18 1:48 PM Medical Oncology and Hematology [facility] [address] [place], [place] [address] Tel. [phone]    Fax. [phone]  Alice Rieger, am acting as scribe for Chauncey Cruel MD.  I, Lurline Del MD, have reviewed the above documentation for accuracy and completeness, and I agree with the above.

## 2018-08-19 ENCOUNTER — Telehealth: Payer: Self-pay | Admitting: Oncology

## 2018-08-19 NOTE — Telephone Encounter (Signed)
Per 9/6 los, no new orders.  °

## 2018-08-23 ENCOUNTER — Other Ambulatory Visit: Payer: Self-pay

## 2018-08-23 ENCOUNTER — Other Ambulatory Visit: Payer: BC Managed Care – PPO

## 2018-08-23 ENCOUNTER — Inpatient Hospital Stay: Payer: BC Managed Care – PPO

## 2018-08-23 ENCOUNTER — Ambulatory Visit: Payer: BC Managed Care – PPO

## 2018-08-23 VITALS — BP 90/53 | HR 87 | Temp 98.4°F | Resp 16

## 2018-08-23 DIAGNOSIS — C50411 Malignant neoplasm of upper-outer quadrant of right female breast: Secondary | ICD-10-CM

## 2018-08-23 DIAGNOSIS — Z170 Estrogen receptor positive status [ER+]: Principal | ICD-10-CM

## 2018-08-23 DIAGNOSIS — Z95828 Presence of other vascular implants and grafts: Secondary | ICD-10-CM

## 2018-08-23 DIAGNOSIS — Z5111 Encounter for antineoplastic chemotherapy: Secondary | ICD-10-CM | POA: Diagnosis not present

## 2018-08-23 LAB — CBC WITH DIFFERENTIAL/PLATELET
BASOS ABS: 0.1 10*3/uL (ref 0.0–0.1)
BASOS PCT: 2 %
Eosinophils Absolute: 0.2 10*3/uL (ref 0.0–0.5)
Eosinophils Relative: 6 %
HCT: 29.1 % — ABNORMAL LOW (ref 34.8–46.6)
HEMOGLOBIN: 9.8 g/dL — AB (ref 11.6–15.9)
LYMPHS PCT: 30 %
Lymphs Abs: 1.1 10*3/uL (ref 0.9–3.3)
MCH: 32.9 pg (ref 25.1–34.0)
MCHC: 33.7 g/dL (ref 31.5–36.0)
MCV: 97.7 fL (ref 79.5–101.0)
MONO ABS: 0.3 10*3/uL (ref 0.1–0.9)
Monocytes Relative: 9 %
NEUTROS PCT: 53 %
Neutro Abs: 2 10*3/uL (ref 1.5–6.5)
Platelets: 291 10*3/uL (ref 145–400)
RBC: 2.98 MIL/uL — ABNORMAL LOW (ref 3.70–5.45)
RDW: 15.6 % — ABNORMAL HIGH (ref 11.2–14.5)
WBC: 3.7 10*3/uL — AB (ref 3.9–10.3)

## 2018-08-23 LAB — COMPREHENSIVE METABOLIC PANEL
ALBUMIN: 4.1 g/dL (ref 3.5–5.0)
ALK PHOS: 53 U/L (ref 38–126)
ALT: 28 U/L (ref 0–44)
ANION GAP: 10 (ref 5–15)
AST: 25 U/L (ref 15–41)
BUN: 15 mg/dL (ref 6–20)
CALCIUM: 9.4 mg/dL (ref 8.9–10.3)
CO2: 26 mmol/L (ref 22–32)
Chloride: 105 mmol/L (ref 98–111)
Creatinine, Ser: 0.94 mg/dL (ref 0.44–1.00)
GLUCOSE: 87 mg/dL (ref 70–99)
POTASSIUM: 3.9 mmol/L (ref 3.5–5.1)
Sodium: 141 mmol/L (ref 135–145)
TOTAL PROTEIN: 6.9 g/dL (ref 6.5–8.1)
Total Bilirubin: 0.5 mg/dL (ref 0.3–1.2)

## 2018-08-23 MED ORDER — SODIUM CHLORIDE 0.9% FLUSH
10.0000 mL | INTRAVENOUS | Status: DC | PRN
  Administered 2018-08-23: 10 mL
  Filled 2018-08-23: qty 10

## 2018-08-23 MED ORDER — DEXAMETHASONE SODIUM PHOSPHATE 10 MG/ML IJ SOLN
INTRAMUSCULAR | Status: AC
  Filled 2018-08-23: qty 1

## 2018-08-23 MED ORDER — SODIUM CHLORIDE 0.9 % IV SOLN: 4.0000 mg | Freq: Once | INTRAVENOUS | Status: DC

## 2018-08-23 MED ORDER — FAMOTIDINE IN NACL 20-0.9 MG/50ML-% IV SOLN
INTRAVENOUS | Status: AC
  Filled 2018-08-23: qty 50

## 2018-08-23 MED ORDER — DIPHENHYDRAMINE HCL 50 MG/ML IJ SOLN
25.0000 mg | Freq: Once | INTRAMUSCULAR | Status: AC
  Administered 2018-08-23: 25 mg via INTRAVENOUS

## 2018-08-23 MED ORDER — ALTEPLASE 2 MG IJ SOLR
2.0000 mg | Freq: Once | INTRAMUSCULAR | Status: AC | PRN
  Administered 2018-08-23: 2 mg
  Filled 2018-08-23: qty 2

## 2018-08-23 MED ORDER — HEPARIN SOD (PORK) LOCK FLUSH 100 UNIT/ML IV SOLN
500.0000 [IU] | Freq: Once | INTRAVENOUS | Status: AC | PRN
  Administered 2018-08-23: 500 [IU]
  Filled 2018-08-23: qty 5

## 2018-08-23 MED ORDER — FAMOTIDINE IN NACL 20-0.9 MG/50ML-% IV SOLN
20.0000 mg | Freq: Once | INTRAVENOUS | Status: AC
  Administered 2018-08-23: 20 mg via INTRAVENOUS

## 2018-08-23 MED ORDER — SODIUM CHLORIDE 0.9 % IV SOLN
Freq: Once | INTRAVENOUS | Status: AC
  Administered 2018-08-23: 14:00:00 via INTRAVENOUS
  Filled 2018-08-23: qty 250

## 2018-08-23 MED ORDER — SODIUM CHLORIDE 0.9 % IV SOLN
80.0000 mg/m2 | Freq: Once | INTRAVENOUS | Status: AC
  Administered 2018-08-23: 156 mg via INTRAVENOUS
  Filled 2018-08-23: qty 26

## 2018-08-23 MED ORDER — ALTEPLASE 2 MG IJ SOLR
INTRAMUSCULAR | Status: AC
  Filled 2018-08-23: qty 2

## 2018-08-23 MED ORDER — DEXAMETHASONE SODIUM PHOSPHATE 10 MG/ML IJ SOLN
4.0000 mg | Freq: Once | INTRAMUSCULAR | Status: AC
  Administered 2018-08-23: 4 mg via INTRAVENOUS

## 2018-08-23 MED ORDER — DIPHENHYDRAMINE HCL 50 MG/ML IJ SOLN
INTRAMUSCULAR | Status: AC
  Filled 2018-08-23: qty 1

## 2018-08-23 NOTE — Progress Notes (Signed)
Error

## 2018-08-23 NOTE — Progress Notes (Signed)
No blood return at 1606 after 1.5 hours of cathflo.  62ml drawn back from port.  Port deaccessed.  Pt to have dye study.

## 2018-08-23 NOTE — Patient Instructions (Signed)
[facility] Discharge Instructions for Patients Receiving Chemotherapy  Today you received the following chemotherapy agents taxol  To help prevent nausea and vomiting after your treatment, we encourage you to take your nausea medication as directed   If you develop nausea and vomiting that is not controlled by your nausea medication, call the clinic.   BELOW ARE SYMPTOMS THAT SHOULD BE REPORTED IMMEDIATELY:  *FEVER GREATER THAN 100.5 F  *CHILLS WITH OR WITHOUT FEVER  NAUSEA AND VOMITING THAT IS NOT CONTROLLED WITH YOUR NAUSEA MEDICATION  *UNUSUAL SHORTNESS OF BREATH  *UNUSUAL BRUISING OR BLEEDING  TENDERNESS IN MOUTH AND THROAT WITH OR WITHOUT PRESENCE OF ULCERS  *URINARY PROBLEMS  *BOWEL PROBLEMS  UNUSUAL RASH Items with * indicate a potential emergency and should be followed up as soon as possible.  Feel free to call the clinic should you have any questions or concerns. The clinic phone number is [phone].  Please show the CHEMO ALERT CARD at check-in to the Emergency Department and triage nurse.   

## 2018-08-23 NOTE — Progress Notes (Signed)
Port flushed several times with no blood return. Called to infusion room and RN will administer CATHFLOW. Blood is being drawn by Lab 5 peripherally. Sameera Betton LPN

## 2018-08-26 ENCOUNTER — Telehealth: Payer: Self-pay | Admitting: *Deleted

## 2018-08-26 ENCOUNTER — Other Ambulatory Visit: Payer: Self-pay | Admitting: *Deleted

## 2018-08-26 DIAGNOSIS — C50411 Malignant neoplasm of upper-outer quadrant of right female breast: Secondary | ICD-10-CM

## 2018-08-26 DIAGNOSIS — Z95828 Presence of other vascular implants and grafts: Secondary | ICD-10-CM

## 2018-08-26 DIAGNOSIS — Z170 Estrogen receptor positive status [ER+]: Principal | ICD-10-CM

## 2018-08-26 NOTE — Telephone Encounter (Signed)
Call received from Anna Conrad in reference to "Concerns about my chemotherapy treatment.  The port-a-cath did not work.  My arm was used and I don't want to go through that this week.  I want to have the dye test that was mentioned."  Call transferred to collaborative to assist with this request.

## 2018-08-28 ENCOUNTER — Encounter (HOSPITAL_COMMUNITY): Payer: Self-pay | Admitting: Diagnostic Radiology

## 2018-08-28 ENCOUNTER — Ambulatory Visit (HOSPITAL_COMMUNITY)
Admission: RE | Admit: 2018-08-28 | Discharge: 2018-08-28 | Disposition: A | Discharge status: Home / Self Care | Payer: BC Managed Care – PPO | Source: Ambulatory Visit | Attending: Hematology and Oncology | Admitting: Hematology and Oncology

## 2018-08-28 ENCOUNTER — Other Ambulatory Visit (HOSPITAL_COMMUNITY): Payer: Self-pay | Admitting: Diagnostic Radiology

## 2018-08-28 DIAGNOSIS — Y712 Prosthetic and other implants, materials and accessory cardiovascular devices associated with adverse incidents: Secondary | ICD-10-CM | POA: Insufficient documentation

## 2018-08-28 DIAGNOSIS — Z170 Estrogen receptor positive status [ER+]: Secondary | ICD-10-CM

## 2018-08-28 DIAGNOSIS — C50411 Malignant neoplasm of upper-outer quadrant of right female breast: Secondary | ICD-10-CM

## 2018-08-28 DIAGNOSIS — Z95828 Presence of other vascular implants and grafts: Secondary | ICD-10-CM

## 2018-08-28 DIAGNOSIS — C50911 Malignant neoplasm of unspecified site of right female breast: Secondary | ICD-10-CM | POA: Insufficient documentation

## 2018-08-28 DIAGNOSIS — T82524A Displacement of infusion catheter, initial encounter: Secondary | ICD-10-CM | POA: Diagnosis not present

## 2018-08-28 HISTORY — PX: IR CV LINE INJECTION: IMG2294

## 2018-08-28 MED ORDER — IOPAMIDOL (ISOVUE-300) INJECTION 61%
INTRAVENOUS | Status: AC
  Filled 2018-08-28: qty 50

## 2018-08-28 MED ORDER — IOPAMIDOL (ISOVUE-300) INJECTION 61%
5.0000 mL | Freq: Once | INTRAVENOUS | Status: AC | PRN
  Administered 2018-08-28: 5 mL via INTRAVENOUS

## 2018-08-28 MED ORDER — HEPARIN SOD (PORK) LOCK FLUSH 100 UNIT/ML IV SOLN
INTRAVENOUS | Status: AC | PRN
  Administered 2018-08-28: 500 [IU]

## 2018-08-28 MED ORDER — HEPARIN SOD (PORK) LOCK FLUSH 100 UNIT/ML IV SOLN
INTRAVENOUS | Status: AC
  Filled 2018-08-28: qty 5

## 2018-08-28 NOTE — Procedures (Signed)
  Pre-operative Diagnosis: Poorly functioning port       Post-operative Diagnosis: Malpositioned port tip   Indications: Port does not aspirate  Procedure: Port injection  Findings: Left chest port. Tip is against the vein wall at right innominate and SVC junction  Complications: None     EBL: None  Plan: Recommend port revision.

## 2018-08-30 ENCOUNTER — Inpatient Hospital Stay: Payer: BC Managed Care – PPO

## 2018-08-30 ENCOUNTER — Other Ambulatory Visit: Payer: Self-pay

## 2018-08-30 ENCOUNTER — Encounter: Payer: Self-pay | Admitting: Adult Health

## 2018-08-30 ENCOUNTER — Inpatient Hospital Stay (HOSPITAL_BASED_OUTPATIENT_CLINIC_OR_DEPARTMENT_OTHER): Payer: BC Managed Care – PPO | Admitting: Adult Health

## 2018-08-30 ENCOUNTER — Telehealth: Payer: Self-pay | Admitting: Adult Health

## 2018-08-30 VITALS — BP 106/70 | HR 93 | Temp 98.5°F | Resp 18 | Ht 64.0 in | Wt 185.9 lb

## 2018-08-30 DIAGNOSIS — C50411 Malignant neoplasm of upper-outer quadrant of right female breast: Secondary | ICD-10-CM

## 2018-08-30 DIAGNOSIS — Z170 Estrogen receptor positive status [ER+]: Principal | ICD-10-CM

## 2018-08-30 DIAGNOSIS — R0602 Shortness of breath: Secondary | ICD-10-CM | POA: Diagnosis not present

## 2018-08-30 DIAGNOSIS — Z5111 Encounter for antineoplastic chemotherapy: Secondary | ICD-10-CM | POA: Diagnosis not present

## 2018-08-30 LAB — CMP (CANCER CENTER ONLY)
ALBUMIN: 3.8 g/dL (ref 3.5–5.0)
ALT: 27 U/L (ref 0–44)
ANION GAP: 7 (ref 5–15)
AST: 22 U/L (ref 15–41)
Alkaline Phosphatase: 54 U/L (ref 38–126)
BILIRUBIN TOTAL: 0.6 mg/dL (ref 0.3–1.2)
BUN: 13 mg/dL (ref 6–20)
CHLORIDE: 107 mmol/L (ref 98–111)
CO2: 27 mmol/L (ref 22–32)
Calcium: 9.1 mg/dL (ref 8.9–10.3)
Creatinine: 0.84 mg/dL (ref 0.44–1.00)
GFR, Est AFR Am: >60 mL/min (ref >60)
GFR, Estimated: >60 mL/min (ref >60)
GLUCOSE: 90 mg/dL (ref 70–99)
POTASSIUM: 3.9 mmol/L (ref 3.5–5.1)
SODIUM: 141 mmol/L (ref 135–145)
TOTAL PROTEIN: 6.6 g/dL (ref 6.5–8.1)

## 2018-08-30 LAB — CBC WITH DIFFERENTIAL (CANCER CENTER ONLY)
BASOS ABS: 0 10*3/uL (ref 0.0–0.1)
Basophils Relative: 1 %
Eosinophils Absolute: 0.2 10*3/uL (ref 0.0–0.5)
Eosinophils Relative: 6 %
HCT: 29.6 % — ABNORMAL LOW (ref 34.8–46.6)
Hemoglobin: 9.9 g/dL — ABNORMAL LOW (ref 11.6–15.9)
LYMPHS ABS: 1 10*3/uL (ref 0.9–3.3)
LYMPHS PCT: 23 %
MCH: 33 pg (ref 25.1–34.0)
MCHC: 33.4 g/dL (ref 31.5–36.0)
MCV: 98.7 fL (ref 79.5–101.0)
MONO ABS: 0.2 10*3/uL (ref 0.1–0.9)
Monocytes Relative: 4 %
NEUTROS ABS: 2.9 10*3/uL (ref 1.5–6.5)
Neutrophils Relative %: 66 %
Platelet Count: 254 10*3/uL (ref 145–400)
RBC: 3 MIL/uL — AB (ref 3.70–5.45)
RDW: 15.7 % — AB (ref 11.2–14.5)
WBC Count: 4.4 10*3/uL (ref 3.9–10.3)

## 2018-08-30 MED ORDER — SODIUM CHLORIDE 0.9 % IV SOLN
Freq: Once | INTRAVENOUS | Status: AC
  Administered 2018-08-30: 12:00:00 via INTRAVENOUS
  Filled 2018-08-30: qty 250

## 2018-08-30 MED ORDER — DEXAMETHASONE SODIUM PHOSPHATE 10 MG/ML IJ SOLN
INTRAMUSCULAR | Status: AC
  Filled 2018-08-30: qty 1

## 2018-08-30 MED ORDER — DIPHENHYDRAMINE HCL 50 MG/ML IJ SOLN
INTRAMUSCULAR | Status: AC
  Filled 2018-08-30: qty 1

## 2018-08-30 MED ORDER — SODIUM CHLORIDE 0.9% FLUSH
10.0000 mL | INTRAVENOUS | Status: DC | PRN
  Filled 2018-08-30: qty 10

## 2018-08-30 MED ORDER — FAMOTIDINE IN NACL 20-0.9 MG/50ML-% IV SOLN
20.0000 mg | Freq: Once | INTRAVENOUS | Status: AC
  Administered 2018-08-30: 20 mg via INTRAVENOUS

## 2018-08-30 MED ORDER — SODIUM CHLORIDE 0.9 % IV SOLN
Freq: Once | INTRAVENOUS | Status: DC
  Filled 2018-08-30: qty 250

## 2018-08-30 MED ORDER — HEPARIN SOD (PORK) LOCK FLUSH 100 UNIT/ML IV SOLN
500.0000 [IU] | Freq: Once | INTRAVENOUS | Status: DC | PRN
  Filled 2018-08-30: qty 5

## 2018-08-30 MED ORDER — FAMOTIDINE IN NACL 20-0.9 MG/50ML-% IV SOLN
INTRAVENOUS | Status: AC
  Filled 2018-08-30: qty 50

## 2018-08-30 MED ORDER — DEXAMETHASONE SODIUM PHOSPHATE 10 MG/ML IJ SOLN
4.0000 mg | Freq: Once | INTRAMUSCULAR | Status: AC
  Administered 2018-08-30: 4 mg via INTRAVENOUS

## 2018-08-30 MED ORDER — SODIUM CHLORIDE 0.9 % IV SOLN
80.0000 mg/m2 | Freq: Once | INTRAVENOUS | Status: AC
  Administered 2018-08-30: 156 mg via INTRAVENOUS
  Filled 2018-08-30: qty 26

## 2018-08-30 MED ORDER — DIPHENHYDRAMINE HCL 50 MG/ML IJ SOLN
25.0000 mg | Freq: Once | INTRAMUSCULAR | Status: AC
  Administered 2018-08-30: 25 mg via INTRAVENOUS

## 2018-08-30 NOTE — Telephone Encounter (Signed)
Per 9/20 los, no orders.

## 2018-08-30 NOTE — Progress Notes (Signed)
[place]  [phone] [phone]     ID: Anna Conrad DOB: [birth year]  MR#: [id number]  TIR#:[id number]  Patient Care Team: Filiberto Pinks as PCP - General (Physician Assistant) Magrinat, Virgie Dad, MD as Consulting Physician (Oncology) Kyung Rudd, MD as Consulting Physician (Radiation Oncology) [name], MD as Consulting Physician (General Surgery) Janyth Pupa, DO as Consulting Physician (Obstetrics and Gynecology) Janyth Pupa, DO as Consulting Physician (Obstetrics and Gynecology) OTHER MD:  CHIEF COMPLAINT: Estrogen receptor positive breast cancer  CURRENT TREATMENT: Adjuvant chemotherapy  INTERVAL HISTORY: Anna Conrad returns today for follow up and treatment of her estrogen receptor positive breast cancer accompanied by her husband.  She has received four cycles of Doxoribucin and Cyclophosphamide, followed by weekly paclitaxel, with the first dose 08/09/2018.  Today is day 1 cycle 4 of her weekly Paclitaxel.    REVIEW OF SYSTEMS: Anna Conrad is feeling tired today.  She noted some diarrhea yesterday.  She also had dye study on port and her port needs to be repositioned which will be done next week.  She is receiving chemotherapy peripherally today.  Aislee notes a discoloration in her axilla.  She noted shortness of breath on Sunday, this is slightly worse with activity and she will rest after doing things she used to normally do.  She notes her taste has changed.  She denies any peripheral neuropathy whatsoever.    Otherwise Anna Conrad is feeling well.  She denies chest pain or palpitations.  She denies unusual headaches and vision changes.  A detailed ROS was otherwise non contributory.     HISTORY OF CURRENT ILLNESS: From the original intake note:  Anna Conrad had routine screening mammography on 01/29/2018 showing a possible mass with associated calcifications in the right breast. She underwent unilateral right  diagnostic mammography with tomography and right breast ultrasonography at [facility] on 02/05/2018 showing a highly suspicious mass over the 10:30 position upper outer of the right breast located 6 cm from the nipple measuring 1.3 x 1.4 x 1.5 cm. Associated microcalcifications corresponding to the mammographic abnormality. Ultrasound of the right axilla was normal.  Accordingly on 02/29/2019 she proceeded to biopsy of the right breast area in question. 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 Prognostic indicators significant for: estrogen receptor, 100% positive with strong staining intensity and progesterone receptor, 20% positive iwht moderate staining intensity. Proliferation marker Ki67 at 10%. HER2 not amplified with ratios HER2/CEP17 signals 1.17 and average HER2 copies per cell 2.45.  On 02/21/2018 the patient had bilateral breast MRI.  This measured the upper outer quadrant right breast mass at 2.3 cm including an area of surrounding non-masslike enhancement.  There was no evidence of multifocal or multicentric disease, no lymphadenopathy, and no findings in the left breast.  The patient's subsequent history is as detailed below.    PAST MEDICAL HISTORY: Past Medical History:  Diagnosis Date  . Blood dyscrasia    hx blood clott superficial in rt arm  . Cancer [facility]) 2019   right breast cancer  . Family history of breast cancer   . Heart murmur   . Hypertension   She used to have migraines. She denies GERD, asthma, emphysema. She was told that she had a slight heart murmur. She denies heart palpitations.   PAST SURGICAL HISTORY: Past Surgical History:  Procedure Laterality Date  . BREAST LUMPECTOMY WITH RADIOACTIVE SEED AND SENTINEL LYMPH NODE BIOPSY Right 03/18/2018  Procedure: RIGHT BREAST PARTIAL MASTECTOMY WITH RADIOACTIVE SEED AND SENTINEL LYMPH NODE BIOPSY;  Surgeon: [name], MD;   Location: [place];  Service: General;  Laterality: Right;  . cesearean section     tubal ligation   . IR CV LINE INJECTION  08/28/2018  . PORTACATH PLACEMENT Left 05/27/2018   Procedure: INSERTION PORT-A-CATH;  Surgeon: [name], MD;  Location: [place];  Service: General;  Laterality: Left;  . TUBAL LIGATION      2 Caesarian sections- tubal ligation at the second c-section.  FAMILY HISTORY Family History  Problem Relation Age of Onset  . Breast cancer Sister 44       approximate  . Cancer Father 60       started in shoulder, spread to spine, brain, lung  . Cancer Paternal [name]        'blood cancer'  . Heart attack Paternal Uncle   . Breast cancer Cousin        age dx unk  . Breast cancer Cousin        age dx unk  . Breast cancer Cousin        age dx unk   The patient's father passed away in the summer 2018 at the age of 77 due to metastatic lung cancer. He was a previous smoker. The patient's mother is alive at age 1. The patient has 1 brother and 1 sister. The patient's sister was diagnosed with breast cancer at the age of 50.  The patient has 3 paternal cousins diagnosed with breast cancer. She denies a family history of ovarian cancer.    GYNECOLOGIC HISTORY:  The patient's periods currently are about twice a month.   Menarche: 52 years old. She used to be on a dance team.  Age at first live birth: 52 years old She is Wagner P2. She was taking oral contraceptives, but this has been discontinued as of January 2019. She had bilateral tubal ligation at the time of her second c-section.    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  She has a PhD.  Her husband, [name], is a Chief Strategy Officer for [employer], making metal parts. He travels about 2-3 times per year. Their sons are [name] age 51, and [name] age 58. She attends [employer].     ADVANCED DIRECTIVES:    HEALTH MAINTENANCE: Social History    Tobacco Use  . Smoking status: Never Smoker  . Smokeless tobacco: Never Used  Substance Use Topics  . Alcohol use: Yes    Comment: occ  . Drug use: Never     Colonoscopy:n/a  PAP: 2017/ normal  Bone density: n/a   Allergies  Allergen Reactions  . Latex Rash    Red  rash    Current Outpatient Medications  Medication Sig Dispense Refill  . acetaminophen (TYLENOL) 500 MG tablet Take 1,000 mg by mouth every 6 (six) hours as needed (for pain.).    Marland Kitchen fluconazole (DIFLUCAN) 100 MG tablet Take 1 tablet (100 mg total) by mouth daily. 30 tablet 0  . ibuprofen (ADVIL,MOTRIN) 200 MG tablet Take 400 mg by mouth every 8 (eight) hours as needed (for pain.).    Marland Kitchen lidocaine-prilocaine (EMLA) cream Apply to affected area once 30 g 3  . lisinopril-hydrochlorothiazide (PRINZIDE,ZESTORETIC) 10-12.5 MG tablet Take 1 tablet by mouth daily.    Marland Kitchen LORazepam (ATIVAN) 0.5 MG tablet Take 1 tablet (0.5 mg total) by mouth at bedtime as  needed (Nausea or vomiting). 30 tablet 0  . miconazole (MONISTAT 7) 2 % vaginal cream Place 1 Applicatorful vaginally at bedtime. 45 g 0  . prochlorperazine (COMPAZINE) 10 MG tablet Take 1 tablet (10 mg total) by mouth every 6 (six) hours as needed (Nausea or vomiting). 30 tablet 1   No current facility-administered medications for this visit.     OBJECTIVE:  Vitals:   08/30/18 1023  BP: 106/70  Pulse: 93  Resp: 18  Temp: 98.5 F (36.9 C)  SpO2: 100%     Body mass index is 31.91 kg/m.   Wt Readings from Last 3 Encounters:  08/30/18 185 lb 14.4 oz (84.3 kg)  08/16/18 185 lb 12.8 oz (84.3 kg)  08/09/18 198 lb 11.2 oz (90.1 kg)   ECOG FS:1 - Symptomatic but completely ambulatory GENERAL: Patient is a well appearing female in no acute distress HEENT:  Sclerae anicteric.  Oropharynx clear and moist. No ulcerations or evidence of oropharyngeal candidiasis. Neck is supple.  NODES:  No cervical, supraclavicular, or axillary lymphadenopathy palpated.  BREAST EXAM:   Right breast s/p lumpectomy.  No nodularity.  No sign of recurrence.  Left breast without nodules, masses, skin/nipple changes LUNGS:  Clear to auscultation bilaterally.  No wheezes or rhonchi. HEART:  Regular rate and rhythm. No murmur appreciated. ABDOMEN:  Soft, nontender.  Positive, normoactive bowel sounds. No organomegaly palpated. MSK:  No focal spinal tenderness to palpation. Full range of motion bilaterally in the upper extremities. EXTREMITIES:  No peripheral edema.   SKIN:  Clear with no obvious rashes or skin changes. No nail dyscrasia. NEURO:  Nonfocal. Well oriented.  Appropriate affect.       LAB RESULTS:  CMP     Component Value Date/Time   NA 141 08/30/2018 1008   K 3.9 08/30/2018 1008   CL 107 08/30/2018 1008   CO2 27 08/30/2018 1008   GLUCOSE 90 08/30/2018 1008   BUN 13 08/30/2018 1008   CREATININE 0.84 08/30/2018 1008   CALCIUM 9.1 08/30/2018 1008   PROT 6.6 08/30/2018 1008   ALBUMIN 3.8 08/30/2018 1008   AST 22 08/30/2018 1008   ALT 27 08/30/2018 1008   ALKPHOS 54 08/30/2018 1008   BILITOT 0.6 08/30/2018 1008   GFRNONAA >60 08/30/2018 1008   GFRAA >60 08/30/2018 1008    No results found for: TOTALPROTELP, ALBUMINELP, A1GS, A2GS, BETS, BETA2SER, GAMS, MSPIKE, SPEI  No results found for: KPAFRELGTCHN, LAMBDASER, KAPLAMBRATIO  Lab Results  Component Value Date   WBC 4.4 08/30/2018   NEUTROABS 2.9 08/30/2018   HGB 9.9 (L) 08/30/2018   HCT 29.6 (L) 08/30/2018   MCV 98.7 08/30/2018   PLT 254 08/30/2018    @LASTCHEMISTRY @  No results found for: LABCA2  No components found for: [id number]  No results for input(s): INR in the last 168 hours.  No results found for: LABCA2  No results found for: EXH371  No results found for: IRC789  No results found for: FYB017  No results found for: CA2729  No components found for: HGQUANT  No results found for: CEA1 / No results found for: CEA1   No results found for: AFPTUMOR  No results found  for: CHROMOGRNA  No results found for: PSA1  Appointment on 08/30/2018  Component Date Value Ref Range Status  . Sodium 08/30/2018 141  135 - 145 mmol/L Final  . Potassium 08/30/2018 3.9  3.5 - 5.1 mmol/L Final  . Chloride 08/30/2018 107  98 - 111 mmol/L Final  .  CO2 08/30/2018 27  22 - 32 mmol/L Final  . Glucose, Bld 08/30/2018 90  70 - 99 mg/dL Final  . BUN 08/30/2018 13  6 - 20 mg/dL Final  . Creatinine 08/30/2018 0.84  0.44 - 1.00 mg/dL Final  . Calcium 08/30/2018 9.1  8.9 - 10.3 mg/dL Final  . Total Protein 08/30/2018 6.6  6.5 - 8.1 g/dL Final  . Albumin 08/30/2018 3.8  3.5 - 5.0 g/dL Final  . AST 08/30/2018 22  15 - 41 U/L Final  . ALT 08/30/2018 27  0 - 44 U/L Final  . Alkaline Phosphatase 08/30/2018 54  38 - 126 U/L Final  . Total Bilirubin 08/30/2018 0.6  0.3 - 1.2 mg/dL Final  . GFR, Est Non Af Am 08/30/2018 >60  >60 mL/min Final  . GFR, Est AFR Am 08/30/2018 >60  >60 mL/min Final   Comment: (NOTE) The eGFR has been calculated using the CKD EPI equation. This calculation has not been validated in all clinical situations. eGFR's persistently <60 mL/min signify possible Chronic Kidney Disease.   Georgiann Hahn gap 08/30/2018 7  5 - 15 Final   Performed at [facility], [place] [address] [place], [place] [address]  . WBC Count 08/30/2018 4.4  3.9 - 10.3 K/uL Final  . RBC 08/30/2018 3.00* 3.70 - 5.45 MIL/uL Final  . Hemoglobin 08/30/2018 9.9* 11.6 - 15.9 g/dL Final  . HCT 08/30/2018 29.6* 34.8 - 46.6 % Final  . MCV 08/30/2018 98.7  79.5 - 101.0 fL Final  . MCH 08/30/2018 33.0  25.1 - 34.0 pg Final  . MCHC 08/30/2018 33.4  31.5 - 36.0 g/dL Final  . RDW 08/30/2018 15.7* 11.2 - 14.5 % Final  . Platelet Count 08/30/2018 254  145 - 400 K/uL Final  . Neutrophils Relative % 08/30/2018 66  % Final  . Neutro Abs 08/30/2018 2.9  1.5 - 6.5 K/uL Final  . Lymphocytes Relative 08/30/2018 23  % Final  . Lymphs Abs 08/30/2018 1.0  0.9 - 3.3 K/uL Final  . Monocytes  Relative 08/30/2018 4  % Final  . Monocytes Absolute 08/30/2018 0.2  0.1 - 0.9 K/uL Final  . Eosinophils Relative 08/30/2018 6  % Final  . Eosinophils Absolute 08/30/2018 0.2  0.0 - 0.5 K/uL Final  . Basophils Relative 08/30/2018 1  % Final  . Basophils Absolute 08/30/2018 0.0  0.0 - 0.1 K/uL Final   Performed at [facility], [address] [place], [place] [address]    (this displays the last labs from the last 3 days)  No results found for: TOTALPROTELP, ALBUMINELP, A1GS, A2GS, BETS, BETA2SER, GAMS, MSPIKE, SPEI (this displays SPEP labs)  No results found for: KPAFRELGTCHN, LAMBDASER, KAPLAMBRATIO (kappa/lambda light chains)  No results found for: HGBA, HGBA2QUANT, HGBFQUANT, HGBSQUAN (Hemoglobinopathy evaluation)   No results found for: LDH  No results found for: IRON, TIBC, IRONPCTSAT (Iron and TIBC)  No results found for: FERRITIN  Urinalysis    Component Value Date/Time   COLORURINE YELLOW 06/25/2018 0815   APPEARANCEUR HAZY (A) 06/25/2018 0815   LABSPEC 1.019 06/25/2018 0815   PHURINE 6.0 06/25/2018 0815   GLUCOSEU NEGATIVE 06/25/2018 0815   HGBUR NEGATIVE 06/25/2018 0815   BILIRUBINUR NEGATIVE 06/25/2018 0815   KETONESUR NEGATIVE 06/25/2018 0815   PROTEINUR NEGATIVE 06/25/2018 0815   NITRITE NEGATIVE 06/25/2018 0815   LEUKOCYTESUR NEGATIVE 06/25/2018 0815     STUDIES: Ir Cv Line Injection  Result Date: 08/28/2018 INDICATION: 52 year old with right breast cancer  and left chest Port-A-Cath. Port-A-Cath is not aspirating well. Patient presents for port injection and evaluation. EXAM: PORT-A-CATH LINE INJECTION MEDICATIONS: None ANESTHESIA/SEDATION: None FLUOROSCOPY TIME:  6 seconds, 062 mGy COMPLICATIONS: None immediate. PROCEDURE: The left chest Port-A-Cath was accessed using sterile technique. Port would flush but would not aspirate. Contrast was injected through the port. Port injection was performed under fluoroscopy. The catheter was  flushed with heparinized saline and the port needle was removed. CONTRAST:  5 mL Isovue-300 FINDINGS: Left subclavian Port-A-Cath tip has migrated cephalad and located at the junction of the right innominate vein and SVC. Catheter is against the vein wall. The SVC and right atrium are patent. No evidence for port discontinuity. IMPRESSION: Malpositioned Port-A-Cath tip. Catheter tip has migrated cephalad and located at the junction of the right innominate vein and SVC. Catheter tip is against the vein wall. Recommend revision of the left chest Port-A-Cath. Electronically Signed   By: Markus Daft M.D.   On: 08/28/2018 12:50    ELIGIBLE FOR AVAILABLE RESEARCH PROTOCOL: BCEP   ASSESSMENT: 52 y.o. [place],  woman status post right breast upper outer quadrant biopsy 02/07/2018 for a clinical T1c-T2 N0, stage IA-B invasive ductal carcinoma, grade 2, estrogen receptor and progesterone receptor positive, HER-2 not amplified, with an MIB-1 of 10%  (1) status post right lumpectomy 03/18/2018 for a pT1c pN0, stage IA invasive ductal carcinoma, grade 2, estrogen and progesterone receptor positive, with negative margins.  A total of 1 lymph node was removed  (2) Mammaprint on 03/18/2018 showed high risk, indicating that with chemotherapy and hormone therapy, the patient would have a nearly 95% chance of having no distant metastases within the next 5 years  (3) chemotherapy will consist of doxorubicin and cyclophosphamide in dose dense fashion x4 starting 06/04/2018, followed by weekly paclitaxel x12 started 08/09/2018.  (a) baseline echocardiogram on 05/23/2018 showed an ejection fraction in the 65-75% range  (4) adjuvant radiation to follow  (5) antiestrogens to follow at the completion of local treatment  (6) genetics testing 03/13/2018 through the Common Hereditary Cancer Panel offered by [facility] ifound no deleterious mutations in APC, ATM, AXIN2, BARD1, BMPR1A, BRCA1, BRCA2, BRIP1, CDH1, CDKN2A (p14ARF),  CDKN2A (p16INK4a), CKD4, CHEK2, CTNNA1, DICER1, EPCAM (Deletion/duplication testing only), GREM1 (promoter region deletion/duplication testing only), KIT, MEN1, MLH1, MSH2, MSH3, MSH6, MUTYH, NBN, NF1, NHTL1, PALB2, PDGFRA, PMS2, POLD1, POLE, PTEN, RAD50, RAD51C, RAD51D, SDHB, SDHC, SDHD, SMAD4, SMARCA4. STK11, TP53, TSC1, TSC2, and VHL.  The following genes were evaluated for sequence changes only: SDHA and HOXB13 c.251G>A variant only.  (a)  A variant of uncertain significance (VUS) in the gene MSH3 was also identified c.1027+4T>C (Intronic).    PLAN:  Hazely is doing moderately well.  She is tolerating the Paclitaxel well without any peripheral neuropathy whatsoever and will continue this.  She will proceed with Paclitaxel today (so long as CMET is within parameters).  She will receive this peripherally and undergo port revision early next week that Dr. Lindi Adie arranged.    Sarahlynn and I reviewed the issue of her increased shortness of breath on exertion.  Her EKG is normal-reviewed also by Dr. Lindi Adie.  She doesn't have any pulmonary symptoms.  I think that it is likely related to deconditioning due to chemotherapy.  We will refer to Dr. Haroldine Laws for evaluation as well.  In the meantime, she and I discussed short periods of exercise with walking every day and to gradually increase this by one minute every few days.  She is in agreement.  I continued to recommend eating bland foods and perhaps flavoring her water to make it more palatable.    She has a good understanding of this plan.  She knows to call for any other issues that may develop before the next visit.  A total of (30) minutes of face-to-face time was spent with this patient with greater than 50% of that time in counseling and care-coordination.    Wilber Bihari, NP  08/30/18 10:48 AM Medical Oncology and Hematology [facility] [address] [place], [place] [address] Tel. [phone]    Fax.  [phone]

## 2018-08-30 NOTE — Patient Instructions (Signed)
[facility] Discharge Instructions for Patients Receiving Chemotherapy  Today you received the following chemotherapy agents taxol  To help prevent nausea and vomiting after your treatment, we encourage you to take your nausea medication as directed   If you develop nausea and vomiting that is not controlled by your nausea medication, call the clinic.   BELOW ARE SYMPTOMS THAT SHOULD BE REPORTED IMMEDIATELY:  *FEVER GREATER THAN 100.5 F  *CHILLS WITH OR WITHOUT FEVER  NAUSEA AND VOMITING THAT IS NOT CONTROLLED WITH YOUR NAUSEA MEDICATION  *UNUSUAL SHORTNESS OF BREATH  *UNUSUAL BRUISING OR BLEEDING  TENDERNESS IN MOUTH AND THROAT WITH OR WITHOUT PRESENCE OF ULCERS  *URINARY PROBLEMS  *BOWEL PROBLEMS  UNUSUAL RASH Items with * indicate a potential emergency and should be followed up as soon as possible.  Feel free to call the clinic should you have any questions or concerns. The clinic phone number is [phone].  Please show the CHEMO ALERT CARD at check-in to the Emergency Department and triage nurse.   

## 2018-09-03 ENCOUNTER — Other Ambulatory Visit: Payer: Self-pay | Admitting: Radiology

## 2018-09-03 ENCOUNTER — Other Ambulatory Visit: Payer: Self-pay | Admitting: Physician Assistant

## 2018-09-04 ENCOUNTER — Other Ambulatory Visit (HOSPITAL_COMMUNITY): Payer: Self-pay | Admitting: Diagnostic Radiology

## 2018-09-04 ENCOUNTER — Ambulatory Visit (HOSPITAL_COMMUNITY)
Admission: RE | Admit: 2018-09-04 | Discharge: 2018-09-04 | Disposition: A | Discharge status: Home / Self Care | Payer: BC Managed Care – PPO | Source: Ambulatory Visit | Attending: Physician Assistant | Admitting: Physician Assistant

## 2018-09-04 ENCOUNTER — Encounter (HOSPITAL_COMMUNITY): Payer: Self-pay

## 2018-09-04 ENCOUNTER — Ambulatory Visit (HOSPITAL_COMMUNITY)
Admission: RE | Admit: 2018-09-04 | Discharge: 2018-09-04 | Disposition: A | Discharge status: Home / Self Care | Payer: BC Managed Care – PPO | Source: Ambulatory Visit | Attending: Diagnostic Radiology | Admitting: Diagnostic Radiology

## 2018-09-04 DIAGNOSIS — Z9889 Other specified postprocedural states: Secondary | ICD-10-CM | POA: Insufficient documentation

## 2018-09-04 DIAGNOSIS — C50411 Malignant neoplasm of upper-outer quadrant of right female breast: Secondary | ICD-10-CM | POA: Diagnosis not present

## 2018-09-04 DIAGNOSIS — Z8249 Family history of ischemic heart disease and other diseases of the circulatory system: Secondary | ICD-10-CM | POA: Insufficient documentation

## 2018-09-04 DIAGNOSIS — Z95828 Presence of other vascular implants and grafts: Secondary | ICD-10-CM

## 2018-09-04 DIAGNOSIS — Z79899 Other long term (current) drug therapy: Secondary | ICD-10-CM | POA: Insufficient documentation

## 2018-09-04 DIAGNOSIS — Z170 Estrogen receptor positive status [ER+]: Secondary | ICD-10-CM | POA: Diagnosis not present

## 2018-09-04 DIAGNOSIS — Z808 Family history of malignant neoplasm of other organs or systems: Secondary | ICD-10-CM | POA: Diagnosis not present

## 2018-09-04 DIAGNOSIS — Z91040 Latex allergy status: Secondary | ICD-10-CM | POA: Insufficient documentation

## 2018-09-04 DIAGNOSIS — Z9851 Tubal ligation status: Secondary | ICD-10-CM | POA: Insufficient documentation

## 2018-09-04 DIAGNOSIS — I10 Essential (primary) hypertension: Secondary | ICD-10-CM | POA: Diagnosis not present

## 2018-09-04 DIAGNOSIS — Z801 Family history of malignant neoplasm of trachea, bronchus and lung: Secondary | ICD-10-CM | POA: Insufficient documentation

## 2018-09-04 DIAGNOSIS — Z9011 Acquired absence of right breast and nipple: Secondary | ICD-10-CM | POA: Diagnosis not present

## 2018-09-04 DIAGNOSIS — Z803 Family history of malignant neoplasm of breast: Secondary | ICD-10-CM | POA: Insufficient documentation

## 2018-09-04 HISTORY — PX: IR IMAGING GUIDED PORT INSERTION: IMG5740

## 2018-09-04 HISTORY — PX: IR REMOVAL TUN ACCESS W/ PORT W/O FL MOD SED: IMG2290

## 2018-09-04 LAB — CBC
HCT: 27.8 % — ABNORMAL LOW (ref 36.0–46.0)
Hemoglobin: 9.6 g/dL — ABNORMAL LOW (ref 12.0–15.0)
MCH: 34.5 pg — ABNORMAL HIGH (ref 26.0–34.0)
MCHC: 34.5 g/dL (ref 30.0–36.0)
MCV: 100 fL (ref 78.0–100.0)
PLATELETS: 261 10*3/uL (ref 150–400)
RBC: 2.78 MIL/uL — ABNORMAL LOW (ref 3.87–5.11)
RDW: 15.3 % (ref 11.5–15.5)
WBC: 3 10*3/uL — AB (ref 4.0–10.5)

## 2018-09-04 LAB — PROTIME-INR
INR: 0.94
PROTHROMBIN TIME: 12.4 s (ref 11.4–15.2)

## 2018-09-04 LAB — APTT: APTT: 26 s (ref 24–36)

## 2018-09-04 MED ORDER — SODIUM CHLORIDE 0.9 % IV SOLN
INTRAVENOUS | Status: DC
  Administered 2018-09-04: 14:00:00 via INTRAVENOUS

## 2018-09-04 MED ORDER — FENTANYL CITRATE (PF) 100 MCG/2ML IJ SOLN
INTRAMUSCULAR | Status: AC | PRN
  Administered 2018-09-04 (×2): 50 ug via INTRAVENOUS

## 2018-09-04 MED ORDER — FENTANYL CITRATE (PF) 100 MCG/2ML IJ SOLN
INTRAMUSCULAR | Status: AC
  Filled 2018-09-04: qty 2

## 2018-09-04 MED ORDER — MIDAZOLAM HCL 2 MG/2ML IJ SOLN
INTRAMUSCULAR | Status: AC | PRN
  Administered 2018-09-04 (×4): 1 mg via INTRAVENOUS

## 2018-09-04 MED ORDER — LIDOCAINE HCL 1 % IJ SOLN
INTRAMUSCULAR | Status: AC
  Filled 2018-09-04: qty 20

## 2018-09-04 MED ORDER — CEFAZOLIN SODIUM-DEXTROSE 2-4 GM/100ML-% IV SOLN
INTRAVENOUS | Status: AC
  Filled 2018-09-04: qty 100

## 2018-09-04 MED ORDER — LIDOCAINE HCL (PF) 1 % IJ SOLN
INTRAMUSCULAR | Status: AC | PRN
  Administered 2018-09-04: 10 mL
  Administered 2018-09-04: 5 mL
  Administered 2018-09-04: 10 mL

## 2018-09-04 MED ORDER — HEPARIN SOD (PORK) LOCK FLUSH 100 UNIT/ML IV SOLN
INTRAVENOUS | Status: AC
  Filled 2018-09-04: qty 5

## 2018-09-04 MED ORDER — CEFAZOLIN SODIUM-DEXTROSE 2-4 GM/100ML-% IV SOLN
2.0000 g | Freq: Once | INTRAVENOUS | Status: AC
  Administered 2018-09-04: 2 g via INTRAVENOUS

## 2018-09-04 MED ORDER — MIDAZOLAM HCL 2 MG/2ML IJ SOLN
INTRAMUSCULAR | Status: AC
  Filled 2018-09-04: qty 4

## 2018-09-04 NOTE — Consult Note (Signed)
Chief Complaint: Patient was seen in consultation today for port a cath removal/new placement  Referring Physician(s): [place]  Supervising Physician: Aletta Edouard  Patient Status: [place]  History of Present Illness: Anna Conrad is a 52 y.o. female with history of right breast carcinoma with lumpectomy with radioactive seed implantation in April of this year.  Left subclavian Port-A-Cath was placed by surgery on 05/27/2018.  Due to inability to aspirate from port she underwent port injection on 08/28/2018 revealing malpositioned tip with migration cephalad and  now at the junction of the right innominate and SVC.  Catheter tip was against the vein wall.  She now presents for left chest wall Port-A-Cath removal and new port placement.  Past Medical History:  Diagnosis Date  . Blood dyscrasia    hx blood clott superficial in rt arm  . Cancer [facility]) 2019   right breast cancer  . Family history of breast cancer   . Heart murmur   . Hypertension     Past Surgical History:  Procedure Laterality Date  . BREAST LUMPECTOMY WITH RADIOACTIVE SEED AND SENTINEL LYMPH NODE BIOPSY Right 03/18/2018   Procedure: RIGHT BREAST PARTIAL MASTECTOMY WITH RADIOACTIVE SEED AND SENTINEL LYMPH NODE BIOPSY;  Surgeon: [name], MD;  Location: [place];  Service: General;  Laterality: Right;  . cesearean section     tubal ligation   . IR CV LINE INJECTION  08/28/2018  . PORTACATH PLACEMENT Left 05/27/2018   Procedure: INSERTION PORT-A-CATH;  Surgeon: [name], MD;  Location: [place];  Service: General;  Laterality: Left;  . TUBAL LIGATION      Allergies: Latex  Medications: Prior to Admission medications   Medication Sig Start Date End Date Taking? Authorizing Provider  lidocaine-prilocaine (EMLA) cream Apply to affected area once 05/28/18  Yes Magrinat, Virgie Dad, MD  lisinopril-hydrochlorothiazide (PRINZIDE,ZESTORETIC) 10-12.5 MG tablet Take 1 tablet by  mouth daily.   Yes [provider]  acetaminophen (TYLENOL) 500 MG tablet Take 1,000 mg by mouth every 6 (six) hours as needed (for pain.).    [provider]  fluconazole (DIFLUCAN) 100 MG tablet Take 1 tablet (100 mg total) by mouth daily. 08/16/18   Magrinat, Virgie Dad, MD  ibuprofen (ADVIL,MOTRIN) 200 MG tablet Take 400 mg by mouth every 8 (eight) hours as needed (for pain.).    [provider]  LORazepam (ATIVAN) 0.5 MG tablet Take 1 tablet (0.5 mg total) by mouth at bedtime as needed (Nausea or vomiting). 05/28/18   Magrinat, Virgie Dad, MD  miconazole (MONISTAT 7) 2 % vaginal cream Place 1 Applicatorful vaginally at bedtime. 08/09/18   Gardenia Phlegm, NP  prochlorperazine (COMPAZINE) 10 MG tablet Take 1 tablet (10 mg total) by mouth every 6 (six) hours as needed (Nausea or vomiting). 05/28/18   Magrinat, Virgie Dad, MD     Family History  Problem Relation Age of Onset  . Breast cancer Sister 47       approximate  . Cancer Father 25       started in shoulder, spread to spine, brain, lung  . Cancer Paternal [name]        'blood cancer'  . Heart attack Paternal Uncle   . Breast cancer Cousin        age dx unk  . Breast cancer Cousin        age dx unk  . Breast cancer Cousin        age dx unk  Social History   Socioeconomic History  . Marital status: Married    Spouse name: Not on file  . Number of children: Not on file  . Years of education: Not on file  . Highest education level: Not on file  Occupational History  . Not on file  Social Needs  . Financial resource strain: Not on file  . Food insecurity:    Worry: Not on file    Inability: Not on file  . Transportation needs:    Medical: Not on file    Non-medical: Not on file  Tobacco Use  . Smoking status: Never Smoker  . Smokeless tobacco: Never Used  Substance and Sexual Activity  . Alcohol use: Yes    Comment: occ  . Drug use: Never  . Sexual activity: Yes    Birth  control/protection: Surgical    Comment: BTL  Lifestyle  . Physical activity:    Days per week: Not on file    Minutes per session: Not on file  . Stress: Not on file  Relationships  . Social connections:    Talks on phone: Not on file    Gets together: Not on file    Attends religious service: Not on file    Active member of club or organization: Not on file    Attends meetings of clubs or organizations: Not on file    Relationship status: Not on file  Other Topics Concern  . Not on file  Social History Narrative  . Not on file      Review of Systems denies fever, headache, chest pain, dyspnea, cough, abdominal/back pain, nausea, vomiting or bleeding.  Vital Signs: BP 107/76   Pulse 83   Temp 98.4 F (36.9 C) (Oral)   Resp 16   LMP 05/10/2018 (Within Days)   SpO2 99%   Physical Exam awake, alert.  Chest clear to auscultation bilaterally.  Clean, intact left chest wall Port-A-Cath.  Heart with regular rate and rhythm.  Abdomen soft, positive bowel sounds, nontender.  No significant lower extremity edema.  Imaging: Ir Cv Line Injection  Result Date: 08/28/2018 INDICATION: 52 year old with right breast cancer and left chest Port-A-Cath. Port-A-Cath is not aspirating well. Patient presents for port injection and evaluation. EXAM: PORT-A-CATH LINE INJECTION MEDICATIONS: None ANESTHESIA/SEDATION: None FLUOROSCOPY TIME:  6 seconds, 440 mGy COMPLICATIONS: None immediate. PROCEDURE: The left chest Port-A-Cath was accessed using sterile technique. Port would flush but would not aspirate. Contrast was injected through the port. Port injection was performed under fluoroscopy. The catheter was flushed with heparinized saline and the port needle was removed. CONTRAST:  5 mL Isovue-300 FINDINGS: Left subclavian Port-A-Cath tip has migrated cephalad and located at the junction of the right innominate vein and SVC. Catheter is against the vein wall. The SVC and right atrium are patent. No  evidence for port discontinuity. IMPRESSION: Malpositioned Port-A-Cath tip. Catheter tip has migrated cephalad and located at the junction of the right innominate vein and SVC. Catheter tip is against the vein wall. Recommend revision of the left chest Port-A-Cath. Electronically Signed   By: Markus Daft M.D.   On: 08/28/2018 12:50    Labs:  CBC: Recent Labs    08/09/18 0857 08/16/18 1236 08/23/18 1210 08/30/18 1008  WBC 4.6 3.7* 3.7* 4.4  HGB 9.6* 9.6* 9.8* 9.9*  HCT 28.7* 27.7* 29.1* 29.6*  PLT 394 367 291 254    COAGS: No results for input(s): INR, APTT in the last 8760 hours.  BMP: Recent Labs  08/09/18 0857 08/16/18 1236 08/23/18 1210 08/30/18 1008  NA 141 140 141 141  K 3.9 3.5 3.9 3.9  CL 107 103 105 107  CO2 25 27 26 27   GLUCOSE 100* 118* 87 90  BUN 18 16 15 13   CALCIUM 9.1 9.6 9.4 9.1  CREATININE 0.83 0.92 0.94 0.84  GFRNONAA >60 >60 >60 >60  GFRAA >60 >60 >60 >60    LIVER FUNCTION TESTS: Recent Labs    08/09/18 0857 08/16/18 1236 08/23/18 1210 08/30/18 1008  BILITOT 0.3 0.4 0.5 0.6  AST 18 29 25 22   ALT 16 42 28 27  ALKPHOS 52 51 53 54  PROT 6.3* 6.9 6.9 6.6  ALBUMIN 3.8 3.9 4.1 3.8    TUMOR MARKERS: No results for input(s): AFPTM, CEA, CA199, CHROMGRNA in the last 8760 hours.  Assessment and Plan: 52 y.o. female with history of right breast carcinoma with lumpectomy with radioactive seed implantation in April of this year.  Left subclavian Port-A-Cath was placed by surgery on 05/27/2018.  Due to inability to aspirate from port she underwent port injection on 08/28/2018 revealing malpositioned tip with migration cephalad and  now at the junction of the right innominate and SVC.  Catheter tip was against the vein wall.  She now presents for left chest wall Port-A-Cath removal and new port placement.Risks and benefits of image guided port-a-catheter removal/placement were discussed with the patient/spouse including, but not limited to bleeding,  infection, pneumothorax, or fibrin sheath development and need for additional procedures.  All of the patient's questions were answered, patient is agreeable to proceed. Consent signed and in chart.     Thank you for this interesting consult.  I greatly enjoyed meeting Anna Conrad and look forward to participating in their care.  A copy of this report was sent to the requesting provider on this date.  Electronically Signed: D. Rowe Robert, PA-C 09/04/2018, 1:59 PM   I spent a total of 25 minutes    in face to face in clinical consultation, greater than 50% of which was counseling/coordinating care for Port-A-Cath removal and new port placement

## 2018-09-04 NOTE — Discharge Instructions (Signed)
Implanted Port Home Guide °An implanted port is a type of central line that is placed under the skin. Central lines are used to provide IV access when treatment or nutrition needs to be given through a person’s veins. Implanted ports are used for long-term IV access. An implanted port may be placed because: °· You need IV medicine that would be irritating to the small veins in your hands or arms. °· You need long-term IV medicines, such as antibiotics. °· You need IV nutrition for a long period. °· You need frequent blood draws for lab tests. °· You need dialysis. ° °Implanted ports are usually placed in the chest area, but they can also be placed in the upper arm, the abdomen, or the leg. An implanted port has two main parts: °· Reservoir. The reservoir is round and will appear as a small, raised area under your skin. The reservoir is the part where a needle is inserted to give medicines or draw blood. °· Catheter. The catheter is a thin, flexible tube that extends from the reservoir. The catheter is placed into a large vein. Medicine that is inserted into the reservoir goes into the catheter and then into the vein. ° °How will I care for my incision site? °Do not get the incision site wet. Bathe or shower as directed by your health care provider. °How is my port accessed? °Special steps must be taken to access the port: °· Before the port is accessed, a numbing cream can be placed on the skin. This helps numb the skin over the port site. °· Your health care provider uses a sterile technique to access the port. °? Your health care provider must put on a mask and sterile gloves. °? The skin over your port is cleaned carefully with an antiseptic and allowed to dry. °? The port is gently pinched between sterile gloves, and a needle is inserted into the port. °· Only "non-coring" port needles should be used to access the port. Once the port is accessed, a blood return should be checked. This helps ensure that the port  is in the vein and is not clogged. °· If your port needs to remain accessed for a constant infusion, a clear (transparent) bandage will be placed over the needle site. The bandage and needle will need to be changed every week, or as directed by your health care provider. °· Keep the bandage covering the needle clean and dry. Do not get it wet. Follow your health care provider’s instructions on how to take a shower or bath while the port is accessed. °· If your port does not need to stay accessed, no bandage is needed over the port. ° °What is flushing? °Flushing helps keep the port from getting clogged. Follow your health care provider’s instructions on how and when to flush the port. Ports are usually flushed with saline solution or a medicine called heparin. The need for flushing will depend on how the port is used. °· If the port is used for intermittent medicines or blood draws, the port will need to be flushed: °? After medicines have been given. °? After blood has been drawn. °? As part of routine maintenance. °· If a constant infusion is running, the port may not need to be flushed. ° °How long will my port stay implanted? °The port can stay in for as long as your health care provider thinks it is needed. When it is time for the port to come out, surgery will be   done to remove it. The procedure is similar to the one performed when the port was put in. °When should I seek immediate medical care? °When you have an implanted port, you should seek immediate medical care if: °· You notice a bad smell coming from the incision site. °· You have swelling, redness, or drainage at the incision site. °· You have more swelling or pain at the port site or the surrounding area. °· You have a fever that is not controlled with medicine. ° °This information is not intended to replace advice given to you by your health care provider. Make sure you discuss any questions you have with your health care provider. °Document  Released: 11/27/2005 Document Revised: 05/04/2016 Document Reviewed: 08/04/2013 °Elsevier Interactive Patient Education © 2017 [employer] °Moderate Conscious Sedation, Adult, Care After °These instructions provide you with information about caring for yourself after your procedure. Your health care provider may also give you more specific instructions. Your treatment has been planned according to current medical practices, but problems sometimes occur. Call your health care provider if you have any problems or questions after your procedure. °What can I expect after the procedure? °After your procedure, it is common: °· To feel sleepy for several hours. °· To feel clumsy and have poor balance for several hours. °· To have poor judgment for several hours. °· To vomit if you eat too soon. ° °Follow these instructions at home: °For at least 24 hours after the procedure: ° °· Do not: °? Participate in activities where you could fall or become injured. °? Drive. °? Use heavy machinery. °? Drink alcohol. °? Take sleeping pills or medicines that cause drowsiness. °? Make important decisions or sign legal documents. °? Take care of children on your own. °· Rest. °Eating and drinking °· Follow the diet recommended by your health care provider. °· If you vomit: °? Drink water, juice, or soup when you can drink without vomiting. °? Make sure you have little or no nausea before eating solid foods. °General instructions °· Have a responsible adult stay with you until you are awake and alert. °· Take over-the-counter and prescription medicines only as told by your health care provider. °· If you smoke, do not smoke without supervision. °· Keep all follow-up visits as told by your health care provider. This is important. °Contact a health care provider if: °· You keep feeling nauseous or you keep vomiting. °· You feel light-headed. °· You develop a rash. °· You have a fever. °Get help right away if: °· You have trouble  breathing. °This information is not intended to replace advice given to you by your health care provider. Make sure you discuss any questions you have with your health care provider. °Document Released: 09/17/2013 Document Revised: 05/01/2016 Document Reviewed: 03/18/2016 °Elsevier Interactive Patient Education © 2018 [employer] ° °

## 2018-09-04 NOTE — Procedures (Signed)
Interventional Radiology Procedure Note  Procedure: Single Lumen Power Port Placement; left chest port removal  Access:  Right IJ vein.  Findings: Catheter tip positioned at SVC/RA junction. Port is ready for immediate use. Left chest port removed and wound closed.  Complications: None  EBL: < 10 mL  Recommendations:  - Ok to shower in 24 hours - Do not submerge for 7 days - Routine line care   Shanquita Ronning T. Kathlene Cote, M.D Pager:  [phone]

## 2018-09-06 ENCOUNTER — Inpatient Hospital Stay: Payer: BC Managed Care – PPO

## 2018-09-06 ENCOUNTER — Ambulatory Visit: Payer: BC Managed Care – PPO

## 2018-09-06 ENCOUNTER — Other Ambulatory Visit: Payer: BC Managed Care – PPO

## 2018-09-06 ENCOUNTER — Telehealth: Payer: Self-pay

## 2018-09-06 VITALS — BP 115/74 | HR 87 | Temp 98.7°F | Resp 18 | Wt 188.8 lb

## 2018-09-06 DIAGNOSIS — Z170 Estrogen receptor positive status [ER+]: Principal | ICD-10-CM

## 2018-09-06 DIAGNOSIS — Z5111 Encounter for antineoplastic chemotherapy: Secondary | ICD-10-CM | POA: Diagnosis not present

## 2018-09-06 DIAGNOSIS — C50411 Malignant neoplasm of upper-outer quadrant of right female breast: Secondary | ICD-10-CM

## 2018-09-06 DIAGNOSIS — Z95828 Presence of other vascular implants and grafts: Secondary | ICD-10-CM

## 2018-09-06 LAB — CMP (CANCER CENTER ONLY)
ALT: 24 U/L (ref 0–44)
ANION GAP: 7 (ref 5–15)
AST: 22 U/L (ref 15–41)
Albumin: 3.8 g/dL (ref 3.5–5.0)
Alkaline Phosphatase: 47 U/L (ref 38–126)
BUN: 13 mg/dL (ref 6–20)
CHLORIDE: 107 mmol/L (ref 98–111)
CO2: 28 mmol/L (ref 22–32)
CREATININE: 0.81 mg/dL (ref 0.44–1.00)
Calcium: 8.9 mg/dL (ref 8.9–10.3)
GFR, Estimated: >60 mL/min (ref >60)
Glucose, Bld: 122 mg/dL — ABNORMAL HIGH (ref 70–99)
POTASSIUM: 3.4 mmol/L — AB (ref 3.5–5.1)
SODIUM: 142 mmol/L (ref 135–145)
Total Bilirubin: 0.3 mg/dL (ref 0.3–1.2)
Total Protein: 6.2 g/dL — ABNORMAL LOW (ref 6.5–8.1)

## 2018-09-06 LAB — CBC WITH DIFFERENTIAL (CANCER CENTER ONLY)
Basophils Absolute: 0 10*3/uL (ref 0.0–0.1)
Basophils Relative: 2 %
Eosinophils Absolute: 0.2 10*3/uL (ref 0.0–0.5)
Eosinophils Relative: 7 %
HEMATOCRIT: 25.7 % — AB (ref 34.8–46.6)
HEMOGLOBIN: 8.8 g/dL — AB (ref 11.6–15.9)
LYMPHS ABS: 0.9 10*3/uL (ref 0.9–3.3)
LYMPHS PCT: 31 %
MCH: 34.2 pg — AB (ref 25.1–34.0)
MCHC: 34.3 g/dL (ref 31.5–36.0)
MCV: 99.8 fL (ref 79.5–101.0)
MONOS PCT: 8 %
Monocytes Absolute: 0.2 10*3/uL (ref 0.1–0.9)
NEUTROS ABS: 1.5 10*3/uL (ref 1.5–6.5)
NEUTROS PCT: 52 %
Platelet Count: 267 10*3/uL (ref 145–400)
RBC: 2.58 MIL/uL — ABNORMAL LOW (ref 3.70–5.45)
RDW: 16.3 % — ABNORMAL HIGH (ref 11.2–14.5)
WBC Count: 2.8 10*3/uL — ABNORMAL LOW (ref 3.9–10.3)

## 2018-09-06 MED ORDER — DEXAMETHASONE SODIUM PHOSPHATE 10 MG/ML IJ SOLN
4.0000 mg | Freq: Once | INTRAMUSCULAR | Status: AC
  Administered 2018-09-06: 4 mg via INTRAVENOUS

## 2018-09-06 MED ORDER — SODIUM CHLORIDE 0.9 % IV SOLN
Freq: Once | INTRAVENOUS | Status: AC
  Administered 2018-09-06: 14:00:00 via INTRAVENOUS
  Filled 2018-09-06: qty 250

## 2018-09-06 MED ORDER — FAMOTIDINE IN NACL 20-0.9 MG/50ML-% IV SOLN
20.0000 mg | Freq: Once | INTRAVENOUS | Status: AC
  Administered 2018-09-06: 20 mg via INTRAVENOUS

## 2018-09-06 MED ORDER — FAMOTIDINE IN NACL 20-0.9 MG/50ML-% IV SOLN
INTRAVENOUS | Status: AC
  Filled 2018-09-06: qty 50

## 2018-09-06 MED ORDER — DIPHENHYDRAMINE HCL 50 MG/ML IJ SOLN
25.0000 mg | Freq: Once | INTRAMUSCULAR | Status: AC
  Administered 2018-09-06: 25 mg via INTRAVENOUS

## 2018-09-06 MED ORDER — DEXAMETHASONE SODIUM PHOSPHATE 10 MG/ML IJ SOLN
INTRAMUSCULAR | Status: AC
  Filled 2018-09-06: qty 1

## 2018-09-06 MED ORDER — HEPARIN SOD (PORK) LOCK FLUSH 100 UNIT/ML IV SOLN
500.0000 [IU] | Freq: Once | INTRAVENOUS | Status: AC | PRN
  Administered 2018-09-06: 500 [IU]
  Filled 2018-09-06: qty 5

## 2018-09-06 MED ORDER — SODIUM CHLORIDE 0.9 % IV SOLN
80.0000 mg/m2 | Freq: Once | INTRAVENOUS | Status: AC
  Administered 2018-09-06: 156 mg via INTRAVENOUS
  Filled 2018-09-06: qty 26

## 2018-09-06 MED ORDER — SODIUM CHLORIDE 0.9% FLUSH
10.0000 mL | INTRAVENOUS | Status: DC | PRN
  Administered 2018-09-06: 10 mL
  Filled 2018-09-06: qty 10

## 2018-09-06 MED ORDER — DIPHENHYDRAMINE HCL 50 MG/ML IJ SOLN
INTRAMUSCULAR | Status: AC
  Filled 2018-09-06: qty 1

## 2018-09-06 NOTE — Patient Instructions (Signed)
[facility] Discharge Instructions for Patients Receiving Chemotherapy  Today you received the following chemotherapy agents:  Taxol.  To help prevent nausea and vomiting after your treatment, we encourage you to take your nausea medication as directed.   If you develop nausea and vomiting that is not controlled by your nausea medication, call the clinic.   BELOW ARE SYMPTOMS THAT SHOULD BE REPORTED IMMEDIATELY:  *FEVER GREATER THAN 100.5 F  *CHILLS WITH OR WITHOUT FEVER  NAUSEA AND VOMITING THAT IS NOT CONTROLLED WITH YOUR NAUSEA MEDICATION  *UNUSUAL SHORTNESS OF BREATH  *UNUSUAL BRUISING OR BLEEDING  TENDERNESS IN MOUTH AND THROAT WITH OR WITHOUT PRESENCE OF ULCERS  *URINARY PROBLEMS  *BOWEL PROBLEMS  UNUSUAL RASH Items with * indicate a potential emergency and should be followed up as soon as possible.  Feel free to call the clinic should you have any questions or concerns. The clinic phone number is [phone].  Please show the CHEMO ALERT CARD at check-in to the Emergency Department and triage nurse.   

## 2018-09-06 NOTE — Telephone Encounter (Signed)
Spoke with pt informing of slightly low potassium level.  Gave examples of foods to eat to help improve level.  Pt voiced understanding.

## 2018-09-06 NOTE — Telephone Encounter (Signed)
-----   Message from Gardenia Phlegm, NP sent at 09/06/2018  3:01 PM EDT ----- Patient potassium slighytly low.  Recommend her increasing potassium intake ----- Message ----- From: Interface, Lab In [place] Sent: 09/06/2018   1:26 PM EDT To: Chauncey Cruel, MD

## 2018-09-10 ENCOUNTER — Telehealth: Payer: Self-pay | Admitting: *Deleted

## 2018-09-10 MED ORDER — METHYLPREDNISOLONE 4 MG PO TBPK: ORAL_TABLET | ORAL | 0 refills | Status: DC

## 2018-09-10 NOTE — Telephone Encounter (Signed)
This RN spoke with pt per her call stating onset of " rash on my neck, chest and both of my hands that is itching - that I think came from the adhesive used with my port replacement last week "  Informed pt medrol dose pak sent to her pharmacy ( verified ) and she can use benadryl by mouth if needed as well as in a lotion for benefit.  No further needs at this time.

## 2018-09-10 NOTE — Progress Notes (Signed)
FMLA successfully faxed to [facility] at [phone]. Mailed copy to patient address on file.

## 2018-09-12 NOTE — Progress Notes (Signed)
[name]  [phone] [phone]     ID: Anna Conrad DOB: 10/14/1966  MR#: [id number]  QQI#:[id number]  Patient Care Team: Filiberto Pinks as PCP - General (Physician Assistant) Hulan Szumski, Virgie Dad, MD as Consulting Physician (Oncology) Kyung Rudd, MD as Consulting Physician (Radiation Oncology) [name], MD as Consulting Physician (General Surgery) Janyth Pupa, DO as Consulting Physician (Obstetrics and Gynecology) Janyth Pupa, DO as Consulting Physician (Obstetrics and Gynecology) OTHER MD:  CHIEF COMPLAINT: Estrogen receptor positive breast cancer  CURRENT TREATMENT: Adjuvant chemotherapy  INTERVAL HISTORY: [name] returns today for follow up and treatment of her estrogen receptor positive breast cancer.  She completed 4 cycles of Doxoribucin and Cyclophosphamide. She receives weekly paclitaxel, with today being day 1 cycle 6 out of 12 planned. She tolerates this well. She has diarrhea for 1-2 days after her treatment. She denies nausea, mouth sores, or peripheral neuropathy.  [name] reports that she had a rash on her hand and chest from bandages after her new port insertion on 09/06/2018.  She also has a little bit of a rash in the dorsum of her hands were she was accessed for fluids.  REVIEW OF SYSTEMS:  She also has had a sore throat. Both of her children had a cold. She denies having a fever. She is working half days on Fridays. She feels more energetic. On the weekends, she spends time with her family and goes to church. She denies unusual headaches, visual changes, nausea, vomiting, or dizziness. There has been no unusual cough, phlegm production, or pleurisy. There has been no change in bowel or bladder habits. She denies unexplained fatigue or unexplained weight loss, bleeding, rash, or fever. A detailed review of systems was otherwise stable.    HISTORY OF CURRENT ILLNESS: From the original intake  note:  Anna Conrad had routine screening mammography on 01/29/2018 showing a possible mass with associated calcifications in the right breast. She underwent unilateral right diagnostic mammography with tomography and right breast ultrasonography at [facility] on 02/05/2018 showing a highly suspicious mass over the 10:30 position upper outer of the right breast located 6 cm from the nipple measuring 1.3 x 1.4 x 1.5 cm. Associated microcalcifications corresponding to the mammographic abnormality. Ultrasound of the right axilla was normal.  Accordingly on 02/29/2019 she proceeded to biopsy of the right breast area in question. 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 Prognostic indicators significant for: estrogen receptor, 100% positive with strong staining intensity and progesterone receptor, 20% positive iwht moderate staining intensity. Proliferation marker Ki67 at 10%. HER2 not amplified with ratios HER2/CEP17 signals 1.17 and average HER2 copies per cell 2.45.  On 02/21/2018 the patient had bilateral breast MRI.  This measured the upper outer quadrant right breast mass at 2.3 cm including an area of surrounding non-masslike enhancement.  There was no evidence of multifocal or multicentric disease, no lymphadenopathy, and no findings in the left breast.  The patient's subsequent history is as detailed below.    PAST MEDICAL HISTORY: Past Medical History:  Diagnosis Date  . Blood dyscrasia    hx blood clott superficial in rt arm  . Cancer [facility]) 2019   right breast cancer  . Family history of breast cancer   . Heart murmur   . Hypertension   She used to have migraines. She denies GERD, asthma, emphysema. She was told that she had a slight heart murmur. She denies heart palpitations.  PAST SURGICAL HISTORY: Past Surgical History:  Procedure Laterality Date  . BREAST LUMPECTOMY WITH RADIOACTIVE SEED  AND SENTINEL LYMPH NODE BIOPSY Right 03/18/2018   Procedure: RIGHT BREAST PARTIAL MASTECTOMY WITH RADIOACTIVE SEED AND SENTINEL LYMPH NODE BIOPSY;  Surgeon: [name], MD;  Location: [place] [facility];  Service: General;  Laterality: Right;  . cesearean section     tubal ligation   . IR CV LINE INJECTION  08/28/2018  . IR IMAGING GUIDED PORT INSERTION  09/04/2018  . IR REMOVAL TUN ACCESS W/ PORT W/O FL MOD SED  09/04/2018  . PORTACATH PLACEMENT Left 05/27/2018   Procedure: INSERTION PORT-A-CATH;  Surgeon: [name], MD;  Location: [facility];  Service: General;  Laterality: Left;  . TUBAL LIGATION      2 Caesarian sections- tubal ligation at the second c-section.  FAMILY HISTORY Family History  Problem Relation Age of Onset  . Breast cancer Sister 27       approximate  . Cancer Father 103       started in shoulder, spread to spine, brain, lung  . Cancer Paternal [name]        'blood cancer'  . Heart attack Paternal Uncle   . Breast cancer Cousin        age dx unk  . Breast cancer Cousin        age dx unk  . Breast cancer Cousin        age dx unk   The patient's father passed away in the summer 2018 at the age of 88 due to metastatic lung cancer. He was a previous smoker. The patient's mother is alive at age 75. The patient has 1 brother and 1 sister. The patient's sister was diagnosed with breast cancer at the age of 52.  The patient has 3 paternal cousins diagnosed with breast cancer. She denies a family history of ovarian cancer.    GYNECOLOGIC HISTORY:  The patient's periods currently are about twice a month.   Menarche: 52 years old. She used to be on a dance team.  Age at first live birth: 52 years old She is [place] P2. She was taking oral contraceptives, but this has been discontinued as of January 2019. She had bilateral tubal ligation at the time of her second c-section.    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  She has a PhD.  Her  husband, [name], is a Chief Strategy Officer for [employer], making metal parts. He travels about 2-3 times per year. Their sons are [name] age 49, and [name] age 38. She attends [employer].     ADVANCED DIRECTIVES:    HEALTH MAINTENANCE: Social History   Tobacco Use  . Smoking status: Never Smoker  . Smokeless tobacco: Never Used  Substance Use Topics  . Alcohol use: Yes    Comment: occ  . Drug use: Never     Colonoscopy:n/a  PAP: 2017/ normal  Bone density: n/a   Allergies  Allergen Reactions  . Adhesive [Tape] Rash  . Latex Rash    Red  rash    Current Outpatient Medications  Medication Sig Dispense Refill  . acetaminophen (TYLENOL) 500 MG tablet Take 1,000 mg by mouth every 6 (six) hours as needed (for pain.).    Marland Kitchen fluconazole (DIFLUCAN) 100 MG tablet Take 1 tablet (100 mg total) by mouth daily. 30 tablet 0  . ibuprofen (ADVIL,MOTRIN) 200 MG tablet Take 400 mg by mouth every 8 (  eight) hours as needed (for pain.).    Marland Kitchen lidocaine-prilocaine (EMLA) cream Apply to affected area once 30 g 3  . lisinopril-hydrochlorothiazide (PRINZIDE,ZESTORETIC) 10-12.5 MG tablet Take 1 tablet by mouth daily.    Marland Kitchen LORazepam (ATIVAN) 0.5 MG tablet Take 1 tablet (0.5 mg total) by mouth at bedtime as needed (Nausea or vomiting). 30 tablet 0  . methylPREDNISolone (MEDROL DOSEPAK) 4 MG TBPK tablet Take as directed 21 tablet 0  . miconazole (MONISTAT 7) 2 % vaginal cream Place 1 Applicatorful vaginally at bedtime. 45 g 0  . prochlorperazine (COMPAZINE) 10 MG tablet Take 1 tablet (10 mg total) by mouth every 6 (six) hours as needed (Nausea or vomiting). 30 tablet 1  . triamcinolone (KENALOG) 0.025 % ointment Apply 1 application topically 2 (two) times daily. 30 g 0   No current facility-administered medications for this visit.     OBJECTIVE: Middle-aged white woman in no acute distress Vitals:   09/13/18 1025  BP: 123/74  Pulse: 90  Resp: 18  Temp: 99.3 F (37.4 C)    SpO2: 100%     Body mass index is 31.79 kg/m.   Wt Readings from Last 3 Encounters:  09/13/18 185 lb 3.2 oz (84 kg)  09/06/18 188 lb 12.8 oz (85.6 kg)  08/30/18 185 lb 14.4 oz (84.3 kg)   ECOG FS:1 - Symptomatic but completely ambulatory  Sclerae unicteric, EOMs intact Oropharynx clear and moist No cervical or supraclavicular adenopathy Lungs no rales or rhonchi Heart regular rate and rhythm Abd soft, nontender, positive bowel sounds MSK no focal spinal tenderness, no upper extremity lymphedema Neuro: nonfocal, well oriented, appropriate affect Breasts: Right breast is status post lumpectomy.  There is no evidence of local recurrence.  The right axilla is benign.  The left breast and axilla are unremarkable   LAB RESULTS:  CMP     Component Value Date/Time   NA 141 09/13/2018 0954   K 3.3 (L) 09/13/2018 0954   CL 105 09/13/2018 0954   CO2 27 09/13/2018 0954   GLUCOSE 102 (H) 09/13/2018 0954   BUN 12 09/13/2018 0954   CREATININE 0.83 09/13/2018 0954   CALCIUM 9.1 09/13/2018 0954   PROT 6.5 09/13/2018 0954   ALBUMIN 4.0 09/13/2018 0954   AST 26 09/13/2018 0954   ALT 65 (H) 09/13/2018 0954   ALKPHOS 46 09/13/2018 0954   BILITOT 0.5 09/13/2018 0954   GFRNONAA >60 09/13/2018 0954   GFRAA >60 09/13/2018 0954    No results found for: TOTALPROTELP, ALBUMINELP, A1GS, A2GS, BETS, BETA2SER, GAMS, MSPIKE, SPEI  No results found for: KPAFRELGTCHN, LAMBDASER, KAPLAMBRATIO  Lab Results  Component Value Date   WBC 3.6 (L) 09/13/2018   NEUTROABS 2.4 09/13/2018   HGB 9.4 (L) 09/13/2018   HCT 27.7 (L) 09/13/2018   MCV 101.4 (H) 09/13/2018   PLT 320 09/13/2018    @LASTCHEMISTRY @  No results found for: LABCA2  No components found for: [id number]  No results for input(s): INR in the last 168 hours.  No results found for: LABCA2  No results found for: HYW737  No results found for: TGG269  No results found for: SWN462  No results found for: CA2729  No components  found for: HGQUANT  No results found for: CEA1 / No results found for: CEA1   No results found for: AFPTUMOR  No results found for: CHROMOGRNA  No results found for: PSA1  Appointment on 09/13/2018  Component Date Value Ref Range Status  . Sodium 09/13/2018 141  135 -  145 mmol/L Final  . Potassium 09/13/2018 3.3* 3.5 - 5.1 mmol/L Final  . Chloride 09/13/2018 105  98 - 111 mmol/L Final  . CO2 09/13/2018 27  22 - 32 mmol/L Final  . Glucose, Bld 09/13/2018 102* 70 - 99 mg/dL Final  . BUN 09/13/2018 12  6 - 20 mg/dL Final  . Creatinine 09/13/2018 0.83  0.44 - 1.00 mg/dL Final  . Calcium 09/13/2018 9.1  8.9 - 10.3 mg/dL Final  . Total Protein 09/13/2018 6.5  6.5 - 8.1 g/dL Final  . Albumin 09/13/2018 4.0  3.5 - 5.0 g/dL Final  . AST 09/13/2018 26  15 - 41 U/L Final  . ALT 09/13/2018 65* 0 - 44 U/L Final  . Alkaline Phosphatase 09/13/2018 46  38 - 126 U/L Final  . Total Bilirubin 09/13/2018 0.5  0.3 - 1.2 mg/dL Final  . GFR, Est Non Af Am 09/13/2018 >60  >60 mL/min Final  . GFR, Est AFR Am 09/13/2018 >60  >60 mL/min Final   Comment: (NOTE) The eGFR has been calculated using the CKD EPI equation. This calculation has not been validated in all clinical situations. eGFR's persistently <60 mL/min signify possible Chronic Kidney Disease.   Georgiann Hahn gap 09/13/2018 9  5 - 15 Final   Performed at [facility], [place] [address], [place], [place] [address]  . WBC Count 09/13/2018 3.6* 3.9 - 10.3 K/uL Final  . RBC 09/13/2018 2.73* 3.70 - 5.45 MIL/uL Final  . Hemoglobin 09/13/2018 9.4* 11.6 - 15.9 g/dL Final  . HCT 09/13/2018 27.7* 34.8 - 46.6 % Final  . MCV 09/13/2018 101.4* 79.5 - 101.0 fL Final  . MCH 09/13/2018 34.5* 25.1 - 34.0 pg Final  . MCHC 09/13/2018 34.0  31.5 - 36.0 g/dL Final  . RDW 09/13/2018 16.6* 11.2 - 14.5 % Final  . Platelet Count 09/13/2018 320  145 - 400 K/uL Final  . Neutrophils Relative % 09/13/2018 66  % Final  . Neutro Abs 09/13/2018 2.4  1.5  - 6.5 K/uL Final  . Lymphocytes Relative 09/13/2018 23  % Final  . Lymphs Abs 09/13/2018 0.8* 0.9 - 3.3 K/uL Final  . Monocytes Relative 09/13/2018 7  % Final  . Monocytes Absolute 09/13/2018 0.3  0.1 - 0.9 K/uL Final  . Eosinophils Relative 09/13/2018 2  % Final  . Eosinophils Absolute 09/13/2018 0.1  0.0 - 0.5 K/uL Final  . Basophils Relative 09/13/2018 2  % Final  . Basophils Absolute 09/13/2018 0.1  0.0 - 0.1 K/uL Final   Performed at [facility], [place] [address] [place], [place] [address]    (this displays the last labs from the last 3 days)  No results found for: TOTALPROTELP, ALBUMINELP, A1GS, A2GS, BETS, BETA2SER, GAMS, MSPIKE, SPEI (this displays SPEP labs)  No results found for: KPAFRELGTCHN, LAMBDASER, KAPLAMBRATIO (kappa/lambda light chains)  No results found for: HGBA, HGBA2QUANT, HGBFQUANT, HGBSQUAN (Hemoglobinopathy evaluation)   No results found for: LDH  No results found for: IRON, TIBC, IRONPCTSAT (Iron and TIBC)  No results found for: FERRITIN  Urinalysis    Component Value Date/Time   COLORURINE YELLOW 06/25/2018 0815   APPEARANCEUR HAZY (A) 06/25/2018 0815   LABSPEC 1.019 06/25/2018 0815   PHURINE 6.0 06/25/2018 0815   GLUCOSEU NEGATIVE 06/25/2018 0815   HGBUR NEGATIVE 06/25/2018 0815   BILIRUBINUR NEGATIVE 06/25/2018 0815   KETONESUR NEGATIVE 06/25/2018 0815   PROTEINUR NEGATIVE 06/25/2018 0815   NITRITE NEGATIVE 06/25/2018 0815   LEUKOCYTESUR NEGATIVE 06/25/2018  6834     STUDIES: Ir Removal [employer] W/ [place] W/o [place] Mod Sed  Result Date: 09/04/2018 CLINICAL DATA:  History of breast carcinoma. Left subclavian Port-A-Cath placed surgically on 05/27/2018 is now malpositioned with catheter tip oriented transversely in the brachiocephalic vein and along the lateral wall of the upper SVC. New port placement required for further chemotherapy. EXAM: 1. IMPLANTED PORT A CATH PLACEMENT WITH ULTRASOUND AND FLUOROSCOPIC GUIDANCE 2.  REMOVAL OF IMPLANTED LEFT CHEST TUNNELED CENTRAL VENOUS PORT-A-CATH ANESTHESIA/SEDATION: 4.0 mg IV Versed; 100 mcg IV Fentanyl Total Moderate Sedation Time:  68 minutes The patient's level of consciousness and physiologic status were continuously monitored during the procedure by Radiology nursing. Additional Medications: 2 g IV Ancef. FLUOROSCOPY TIME:  24 seconds.  5.0 mGy. PROCEDURE: The procedure, risks, benefits, and alternatives were explained to the patient. Questions regarding the procedure were encouraged and answered. The patient understands and consents to the procedure. A time-out was performed prior to initiating the procedure. Ultrasound was utilized to confirm patency of the right internal jugular vein. The right neck and chest were prepped with chlorhexidine in a sterile fashion, and a sterile drape was applied covering the operative field. Maximum barrier sterile technique with sterile gowns and gloves were used for the procedure. Local anesthesia was provided with 1% lidocaine. After creating a small venotomy incision, a 21 gauge needle was advanced into the right internal jugular vein under direct, real-time ultrasound guidance. Ultrasound image documentation was performed. After securing guidewire access, an 8 Fr dilator was placed. A J-wire was kinked to measure appropriate catheter length. A subcutaneous port pocket was then created along the upper chest wall utilizing sharp and blunt dissection. Portable cautery was utilized. The pocket was irrigated with sterile saline. A single lumen power injectable port was chosen for placement. The 8 Fr catheter was tunneled from the port pocket site to the venotomy incision. The port was placed in the pocket. External catheter was trimmed to appropriate length based on guidewire measurement. At the venotomy, an 8 Fr peel-away sheath was placed over a guidewire. The catheter was then placed through the sheath and the sheath removed. Final catheter  positioning was confirmed and documented with a fluoroscopic spot image. The port was accessed with a needle and aspirated and flushed with heparinized saline. The access needle was removed. The venotomy and port pocket incisions were closed with subcutaneous 3-0 Monocryl and subcuticular 4-0 Vicryl. Dermabond was applied to both incisions. The skin overlying the left chest port reservoir was then prepped with chlorhexidine. Local anesthesia was provided with 1% lidocaine. A skin incision was made with a #15 scalpel. Utilizing sharp and blunt dissection, the indwelling left chest subclavian Port-A-Cath was removed. Portable cautery was utilized. The pocket was irrigated with sterile saline. The incision was closed with subcutaneous 3-0 Monocryl and subcuticular 4-0 Vicryl. Dermabond was applied to the incision. COMPLICATIONS: COMPLICATIONS None FINDINGS: After new port placement via the right internal jugular vein, the tip lies at the cavo-atrial junction. The catheter aspirates normally and is ready for immediate use. The left chest port was removed in its entirety without difficulty. IMPRESSION: 1. Placement of single lumen port a cath via right internal jugular vein. The catheter tip lies at the cavo-atrial junction. A power injectable port a cath was placed and is ready for immediate use. 2. Removal of malpositioned left chest port utilizing sharp and blunt dissection. Electronically Signed   By: Aletta Edouard M.D.   On: 09/04/2018 16:42   Ir  Cv Line Injection  Result Date: 08/28/2018 INDICATION: 52 year old with right breast cancer and left chest Port-A-Cath. Port-A-Cath is not aspirating well. Patient presents for port injection and evaluation. EXAM: PORT-A-CATH LINE INJECTION MEDICATIONS: None ANESTHESIA/SEDATION: None FLUOROSCOPY TIME:  6 seconds, 119 mGy COMPLICATIONS: None immediate. PROCEDURE: The left chest Port-A-Cath was accessed using sterile technique. Port would flush but would not aspirate.  Contrast was injected through the port. Port injection was performed under fluoroscopy. The catheter was flushed with heparinized saline and the port needle was removed. CONTRAST:  5 mL Isovue-300 FINDINGS: Left subclavian Port-A-Cath tip has migrated cephalad and located at the junction of the right innominate vein and SVC. Catheter is against the vein wall. The SVC and right atrium are patent. No evidence for port discontinuity. IMPRESSION: Malpositioned Port-A-Cath tip. Catheter tip has migrated cephalad and located at the junction of the right innominate vein and SVC. Catheter tip is against the vein wall. Recommend revision of the left chest Port-A-Cath. Electronically Signed   By: Markus Daft M.D.   On: 08/28/2018 12:50   Ir Imaging Guided Port Insertion  Result Date: 09/04/2018 CLINICAL DATA:  History of breast carcinoma. Left subclavian Port-A-Cath placed surgically on 05/27/2018 is now malpositioned with catheter tip oriented transversely in the brachiocephalic vein and along the lateral wall of the upper SVC. New port placement required for further chemotherapy. EXAM: 1. IMPLANTED PORT A CATH PLACEMENT WITH ULTRASOUND AND FLUOROSCOPIC GUIDANCE 2. REMOVAL OF IMPLANTED LEFT CHEST TUNNELED CENTRAL VENOUS PORT-A-CATH ANESTHESIA/SEDATION: 4.0 mg IV Versed; 100 mcg IV Fentanyl Total Moderate Sedation Time:  68 minutes The patient's level of consciousness and physiologic status were continuously monitored during the procedure by Radiology nursing. Additional Medications: 2 g IV Ancef. FLUOROSCOPY TIME:  24 seconds.  5.0 mGy. PROCEDURE: The procedure, risks, benefits, and alternatives were explained to the patient. Questions regarding the procedure were encouraged and answered. The patient understands and consents to the procedure. A time-out was performed prior to initiating the procedure. Ultrasound was utilized to confirm patency of the right internal jugular vein. The right neck and chest were prepped with  chlorhexidine in a sterile fashion, and a sterile drape was applied covering the operative field. Maximum barrier sterile technique with sterile gowns and gloves were used for the procedure. Local anesthesia was provided with 1% lidocaine. After creating a small venotomy incision, a 21 gauge needle was advanced into the right internal jugular vein under direct, real-time ultrasound guidance. Ultrasound image documentation was performed. After securing guidewire access, an 8 Fr dilator was placed. A J-wire was kinked to measure appropriate catheter length. A subcutaneous port pocket was then created along the upper chest wall utilizing sharp and blunt dissection. Portable cautery was utilized. The pocket was irrigated with sterile saline. A single lumen power injectable port was chosen for placement. The 8 Fr catheter was tunneled from the port pocket site to the venotomy incision. The port was placed in the pocket. External catheter was trimmed to appropriate length based on guidewire measurement. At the venotomy, an 8 Fr peel-away sheath was placed over a guidewire. The catheter was then placed through the sheath and the sheath removed. Final catheter positioning was confirmed and documented with a fluoroscopic spot image. The port was accessed with a needle and aspirated and flushed with heparinized saline. The access needle was removed. The venotomy and port pocket incisions were closed with subcutaneous 3-0 Monocryl and subcuticular 4-0 Vicryl. Dermabond was applied to both incisions. The skin overlying the left chest  port reservoir was then prepped with chlorhexidine. Local anesthesia was provided with 1% lidocaine. A skin incision was made with a #15 scalpel. Utilizing sharp and blunt dissection, the indwelling left chest subclavian Port-A-Cath was removed. Portable cautery was utilized. The pocket was irrigated with sterile saline. The incision was closed with subcutaneous 3-0 Monocryl and subcuticular 4-0  Vicryl. Dermabond was applied to the incision. COMPLICATIONS: COMPLICATIONS None FINDINGS: After new port placement via the right internal jugular vein, the tip lies at the cavo-atrial junction. The catheter aspirates normally and is ready for immediate use. The left chest port was removed in its entirety without difficulty. IMPRESSION: 1. Placement of single lumen port a cath via right internal jugular vein. The catheter tip lies at the cavo-atrial junction. A power injectable port a cath was placed and is ready for immediate use. 2. Removal of malpositioned left chest port utilizing sharp and blunt dissection. Electronically Signed   By: Aletta Edouard M.D.   On: 09/04/2018 16:42    ELIGIBLE FOR AVAILABLE RESEARCH PROTOCOL: BCEP   ASSESSMENT: 52 y.o. [place] woman status post right breast upper outer quadrant biopsy 02/07/2018 for a clinical T1c-T2 N0, stage IA-B invasive ductal carcinoma, grade 2, estrogen receptor and progesterone receptor positive, HER-2 not amplified, with an MIB-1 of 10%  (1) status post right lumpectomy 03/18/2018 for a pT1c pN0, stage IA invasive ductal carcinoma, grade 2, estrogen and progesterone receptor positive, with negative margins.  A total of 1 lymph node was removed  (2) Mammaprint on 03/18/2018 showed high risk, indicating that with chemotherapy and hormone therapy, the patient would have a nearly 95% chance of having no distant metastases within the next 5 years  (3) chemotherapy will consist of doxorubicin and cyclophosphamide in dose dense fashion x4 starting 06/04/2018, followed by weekly paclitaxel x12 started 08/09/2018.  (a) baseline echocardiogram on 05/23/2018 showed an ejection fraction in the 65-75% range  (4) adjuvant radiation to follow  (5) antiestrogens to follow at the completion of local treatment  (6) genetics testing 03/13/2018 through the Common Hereditary Cancer Panel offered by [facility] ifound no deleterious mutations in APC, ATM, AXIN2,  BARD1, BMPR1A, BRCA1, BRCA2, BRIP1, CDH1, CDKN2A (p14ARF), CDKN2A (p16INK4a), CKD4, CHEK2, CTNNA1, DICER1, EPCAM (Deletion/duplication testing only), GREM1 (promoter region deletion/duplication testing only), KIT, MEN1, MLH1, MSH2, MSH3, MSH6, MUTYH, NBN, NF1, NHTL1, PALB2, PDGFRA, PMS2, POLD1, POLE, PTEN, RAD50, RAD51C, RAD51D, SDHB, SDHC, SDHD, SMAD4, SMARCA4. STK11, TP53, TSC1, TSC2, and VHL.  The following genes were evaluated for sequence changes only: SDHA and HOXB13 c.251G>A variant only.  (a)  A variant of uncertain significance (VUS) in the gene MSH3 was also identified c.1027+4T>C (Intronic).    PLAN:  Deosha had to have her port replaced and that generally went well except that she is developed a rash from the adhesive.  We have added that to her list of allergies.  I wrote her for triamcinolone cream to use symptomatically.  She will have her 6 cycle of Taxol today and that means she is halfway there, with no neuropathy.  This is very favorable.  She has done an excellent job at dealing with side effects.  She does have a little bit of diarrhea after each treatment.  I am going to add Zofran to her premedications which may take care of the problem.  Otherwise she will return for treatment next week.  She does not have an appointment with [place] so I have cautioned her to let us know if any peripheral neuropathy symptoms  develop.  She will return to see me and get treated 2 weeks from now  She knows to call for any other issues that may develop before the next visit.   Eliyah Mcshea, Virgie Dad, MD  09/13/18 10:54 AM Medical Oncology and Hematology [facility] [address] [place], [place] [address] Tel. [phone]    Fax. [phone]  Alice Rieger, am acting as scribe for Chauncey Cruel MD.  I, Lurline Del MD, have reviewed the above documentation for accuracy and completeness, and I agree with the above.

## 2018-09-13 ENCOUNTER — Inpatient Hospital Stay: Payer: BC Managed Care – PPO

## 2018-09-13 ENCOUNTER — Telehealth: Payer: Self-pay | Admitting: *Deleted

## 2018-09-13 ENCOUNTER — Inpatient Hospital Stay (HOSPITAL_BASED_OUTPATIENT_CLINIC_OR_DEPARTMENT_OTHER): Payer: BC Managed Care – PPO | Admitting: Oncology

## 2018-09-13 ENCOUNTER — Inpatient Hospital Stay: Payer: BC Managed Care – PPO | Attending: Oncology

## 2018-09-13 VITALS — BP 123/74 | HR 90 | Temp 99.3°F | Resp 18 | Ht 64.0 in | Wt 185.2 lb

## 2018-09-13 DIAGNOSIS — Z95828 Presence of other vascular implants and grafts: Secondary | ICD-10-CM

## 2018-09-13 DIAGNOSIS — C50411 Malignant neoplasm of upper-outer quadrant of right female breast: Secondary | ICD-10-CM | POA: Insufficient documentation

## 2018-09-13 DIAGNOSIS — Z5111 Encounter for antineoplastic chemotherapy: Secondary | ICD-10-CM | POA: Insufficient documentation

## 2018-09-13 DIAGNOSIS — Z170 Estrogen receptor positive status [ER+]: Secondary | ICD-10-CM | POA: Diagnosis not present

## 2018-09-13 DIAGNOSIS — G629 Polyneuropathy, unspecified: Secondary | ICD-10-CM | POA: Insufficient documentation

## 2018-09-13 DIAGNOSIS — Z452 Encounter for adjustment and management of vascular access device: Secondary | ICD-10-CM | POA: Diagnosis not present

## 2018-09-13 DIAGNOSIS — R197 Diarrhea, unspecified: Secondary | ICD-10-CM | POA: Insufficient documentation

## 2018-09-13 LAB — CMP (CANCER CENTER ONLY)
ALBUMIN: 4 g/dL (ref 3.5–5.0)
ALK PHOS: 46 U/L (ref 38–126)
ALT: 65 U/L — AB (ref 0–44)
ANION GAP: 9 (ref 5–15)
AST: 26 U/L (ref 15–41)
BILIRUBIN TOTAL: 0.5 mg/dL (ref 0.3–1.2)
BUN: 12 mg/dL (ref 6–20)
CHLORIDE: 105 mmol/L (ref 98–111)
CO2: 27 mmol/L (ref 22–32)
Calcium: 9.1 mg/dL (ref 8.9–10.3)
Creatinine: 0.83 mg/dL (ref 0.44–1.00)
GFR, Est AFR Am: >60 mL/min (ref >60)
Glucose, Bld: 102 mg/dL — ABNORMAL HIGH (ref 70–99)
POTASSIUM: 3.3 mmol/L — AB (ref 3.5–5.1)
Sodium: 141 mmol/L (ref 135–145)
TOTAL PROTEIN: 6.5 g/dL (ref 6.5–8.1)

## 2018-09-13 LAB — CBC WITH DIFFERENTIAL (CANCER CENTER ONLY)
BASOS ABS: 0.1 10*3/uL (ref 0.0–0.1)
Basophils Relative: 2 %
Eosinophils Absolute: 0.1 10*3/uL (ref 0.0–0.5)
Eosinophils Relative: 2 %
HEMATOCRIT: 27.7 % — AB (ref 34.8–46.6)
Hemoglobin: 9.4 g/dL — ABNORMAL LOW (ref 11.6–15.9)
LYMPHS ABS: 0.8 10*3/uL — AB (ref 0.9–3.3)
LYMPHS PCT: 23 %
MCH: 34.5 pg — ABNORMAL HIGH (ref 25.1–34.0)
MCHC: 34 g/dL (ref 31.5–36.0)
MCV: 101.4 fL — AB (ref 79.5–101.0)
Monocytes Absolute: 0.3 10*3/uL (ref 0.1–0.9)
Monocytes Relative: 7 %
NEUTROS ABS: 2.4 10*3/uL (ref 1.5–6.5)
Neutrophils Relative %: 66 %
Platelet Count: 320 10*3/uL (ref 145–400)
RBC: 2.73 MIL/uL — AB (ref 3.70–5.45)
RDW: 16.6 % — ABNORMAL HIGH (ref 11.2–14.5)
WBC: 3.6 10*3/uL — AB (ref 3.9–10.3)

## 2018-09-13 MED ORDER — DEXAMETHASONE SODIUM PHOSPHATE 10 MG/ML IJ SOLN
4.0000 mg | Freq: Once | INTRAMUSCULAR | Status: AC
  Administered 2018-09-13: 4 mg via INTRAVENOUS

## 2018-09-13 MED ORDER — ONDANSETRON HCL 4 MG/2ML IJ SOLN
8.0000 mg | Freq: Once | INTRAMUSCULAR | Status: AC
  Administered 2018-09-13: 8 mg via INTRAVENOUS

## 2018-09-13 MED ORDER — ONDANSETRON HCL 4 MG/2ML IJ SOLN
INTRAMUSCULAR | Status: AC
  Filled 2018-09-13: qty 4

## 2018-09-13 MED ORDER — TRIAMCINOLONE ACETONIDE 0.025 % EX OINT: 1.0000 "application " | TOPICAL_OINTMENT | Freq: Two times a day (BID) | CUTANEOUS | 0 refills | Status: DC

## 2018-09-13 MED ORDER — DEXAMETHASONE SODIUM PHOSPHATE 10 MG/ML IJ SOLN
INTRAMUSCULAR | Status: AC
  Filled 2018-09-13: qty 1

## 2018-09-13 MED ORDER — SODIUM CHLORIDE 0.9 % IV SOLN: Freq: Once | INTRAVENOUS | Status: DC

## 2018-09-13 MED ORDER — DIPHENHYDRAMINE HCL 50 MG/ML IJ SOLN
25.0000 mg | Freq: Once | INTRAMUSCULAR | Status: AC
  Administered 2018-09-13: 25 mg via INTRAVENOUS

## 2018-09-13 MED ORDER — HEPARIN SOD (PORK) LOCK FLUSH 100 UNIT/ML IV SOLN
500.0000 [IU] | Freq: Once | INTRAVENOUS | Status: AC | PRN
  Administered 2018-09-13: 500 [IU]
  Filled 2018-09-13: qty 5

## 2018-09-13 MED ORDER — SODIUM CHLORIDE 0.9 % IV SOLN
80.0000 mg/m2 | Freq: Once | INTRAVENOUS | Status: AC
  Administered 2018-09-13: 156 mg via INTRAVENOUS
  Filled 2018-09-13: qty 26

## 2018-09-13 MED ORDER — DIPHENHYDRAMINE HCL 50 MG/ML IJ SOLN
INTRAMUSCULAR | Status: AC
  Filled 2018-09-13: qty 1

## 2018-09-13 MED ORDER — SODIUM CHLORIDE 0.9% FLUSH
10.0000 mL | INTRAVENOUS | Status: DC | PRN
  Administered 2018-09-13: 10 mL
  Filled 2018-09-13: qty 10

## 2018-09-13 MED ORDER — SODIUM CHLORIDE 0.9 % IV SOLN
Freq: Once | INTRAVENOUS | Status: AC
  Administered 2018-09-13: 13:00:00 via INTRAVENOUS
  Filled 2018-09-13: qty 250

## 2018-09-13 MED ORDER — FAMOTIDINE IN NACL 20-0.9 MG/50ML-% IV SOLN
20.0000 mg | Freq: Once | INTRAVENOUS | Status: AC
  Administered 2018-09-13: 20 mg via INTRAVENOUS

## 2018-09-13 MED ORDER — FAMOTIDINE IN NACL 20-0.9 MG/50ML-% IV SOLN
INTRAVENOUS | Status: AC
  Filled 2018-09-13: qty 50

## 2018-09-13 NOTE — Patient Instructions (Signed)
[facility] Discharge Instructions for Patients Receiving Chemotherapy  Today you received the following chemotherapy agents:  Taxol.  To help prevent nausea and vomiting after your treatment, we encourage you to take your nausea medication as directed.   If you develop nausea and vomiting that is not controlled by your nausea medication, call the clinic.   BELOW ARE SYMPTOMS THAT SHOULD BE REPORTED IMMEDIATELY:  *FEVER GREATER THAN 100.5 F  *CHILLS WITH OR WITHOUT FEVER  NAUSEA AND VOMITING THAT IS NOT CONTROLLED WITH YOUR NAUSEA MEDICATION  *UNUSUAL SHORTNESS OF BREATH  *UNUSUAL BRUISING OR BLEEDING  TENDERNESS IN MOUTH AND THROAT WITH OR WITHOUT PRESENCE OF ULCERS  *URINARY PROBLEMS  *BOWEL PROBLEMS  UNUSUAL RASH Items with * indicate a potential emergency and should be followed up as soon as possible.  Feel free to call the clinic should you have any questions or concerns. The clinic phone number is [phone].  Please show the CHEMO ALERT CARD at check-in to the Emergency Department and triage nurse.   

## 2018-09-13 NOTE — Telephone Encounter (Signed)
This RN was asked by tech to see pt while seeing MD due to pt stating she feels she is having a local reaction to dressing at port site.  Upon assessment- pt states port site has become increasingly itchy- with noted redness under dressing and areas of redness coming up towards the clavicle.  Dressing loosened and sterile gauze covered with paper tape applied loosely to site.  Pt stated immediate relief with above intervention.  MD came in to see pt.  This RN went to assess pt post MD visit and pt could not be located in room, at scheduling or in lobby.  Infusion room charge nurse notified of above.

## 2018-09-16 ENCOUNTER — Telehealth: Payer: Self-pay | Admitting: Oncology

## 2018-09-16 NOTE — Telephone Encounter (Signed)
Per 10/4 los, no new orders or referrals.

## 2018-09-17 ENCOUNTER — Telehealth: Payer: Self-pay | Admitting: *Deleted

## 2018-09-17 ENCOUNTER — Telehealth: Payer: Self-pay | Admitting: Oncology

## 2018-09-17 NOTE — Telephone Encounter (Signed)
Called regarding 10/11 °

## 2018-09-17 NOTE — Telephone Encounter (Signed)
This RN received faxed communication from [place] as well as VM this AM from pt stating onset of " numbness and tingling in my feet - more on the right and then in my left hand "  " Dr Jana Hakim told me to call if symptom occurred "  Pt has had 6 cycles of weekly taxol.  Scheduled this Friday for cycle 7.  This RN returned call to pt and obtained identified VM- message left informing pt to monitor symptoms as well as appointment for provider will be made prior to her treatment for further assessment and treatment decision.  Request for appointment sent to scheduling.

## 2018-09-20 ENCOUNTER — Other Ambulatory Visit: Payer: BC Managed Care – PPO

## 2018-09-20 ENCOUNTER — Inpatient Hospital Stay: Payer: BC Managed Care – PPO

## 2018-09-20 ENCOUNTER — Ambulatory Visit: Payer: BC Managed Care – PPO

## 2018-09-20 ENCOUNTER — Inpatient Hospital Stay (HOSPITAL_BASED_OUTPATIENT_CLINIC_OR_DEPARTMENT_OTHER): Payer: BC Managed Care – PPO | Admitting: Adult Health

## 2018-09-20 ENCOUNTER — Encounter: Payer: Self-pay | Admitting: Adult Health

## 2018-09-20 VITALS — BP 104/74 | HR 85 | Temp 98.7°F | Resp 18 | Ht 64.0 in | Wt 184.7 lb

## 2018-09-20 DIAGNOSIS — C50411 Malignant neoplasm of upper-outer quadrant of right female breast: Secondary | ICD-10-CM

## 2018-09-20 DIAGNOSIS — Z170 Estrogen receptor positive status [ER+]: Principal | ICD-10-CM

## 2018-09-20 DIAGNOSIS — Z5111 Encounter for antineoplastic chemotherapy: Secondary | ICD-10-CM | POA: Diagnosis not present

## 2018-09-20 DIAGNOSIS — G629 Polyneuropathy, unspecified: Secondary | ICD-10-CM

## 2018-09-20 DIAGNOSIS — Z95828 Presence of other vascular implants and grafts: Secondary | ICD-10-CM

## 2018-09-20 LAB — CBC WITH DIFFERENTIAL (CANCER CENTER ONLY)
Abs Immature Granulocytes: 0.01 10*3/uL (ref 0.00–0.07)
BASOS ABS: 0 10*3/uL (ref 0.0–0.1)
BASOS PCT: 1 %
EOS ABS: 0.2 10*3/uL (ref 0.0–0.5)
EOS PCT: 5 %
HEMATOCRIT: 29.6 % — AB (ref 36.0–46.0)
Hemoglobin: 10.2 g/dL — ABNORMAL LOW (ref 12.0–15.0)
Immature Granulocytes: 0 %
LYMPHS ABS: 0.9 10*3/uL (ref 0.7–4.0)
Lymphocytes Relative: 27 %
MCH: 34.8 pg — ABNORMAL HIGH (ref 26.0–34.0)
MCHC: 34.5 g/dL (ref 30.0–36.0)
MCV: 101 fL — AB (ref 80.0–100.0)
Monocytes Absolute: 0.3 10*3/uL (ref 0.1–1.0)
Monocytes Relative: 8 %
NRBC: 0 % (ref 0.0–0.2)
Neutro Abs: 2 10*3/uL (ref 1.7–7.7)
Neutrophils Relative %: 59 %
PLATELETS: 262 10*3/uL (ref 150–400)
RBC: 2.93 MIL/uL — ABNORMAL LOW (ref 3.87–5.11)
RDW: 14.5 % (ref 11.5–15.5)
WBC Count: 3.3 10*3/uL — ABNORMAL LOW (ref 4.0–10.5)

## 2018-09-20 LAB — CMP (CANCER CENTER ONLY)
ALK PHOS: 52 U/L (ref 38–126)
ALT: 30 U/L (ref 0–44)
ANION GAP: 10 (ref 5–15)
AST: 25 U/L (ref 15–41)
Albumin: 4.1 g/dL (ref 3.5–5.0)
BILIRUBIN TOTAL: 0.6 mg/dL (ref 0.3–1.2)
BUN: 12 mg/dL (ref 6–20)
CO2: 26 mmol/L (ref 22–32)
CREATININE: 0.84 mg/dL (ref 0.44–1.00)
Calcium: 9.7 mg/dL (ref 8.9–10.3)
Chloride: 104 mmol/L (ref 98–111)
Glucose, Bld: 87 mg/dL (ref 70–99)
Potassium: 3.7 mmol/L (ref 3.5–5.1)
SODIUM: 140 mmol/L (ref 135–145)
TOTAL PROTEIN: 6.8 g/dL (ref 6.5–8.1)

## 2018-09-20 MED ORDER — SODIUM CHLORIDE 0.9% FLUSH
10.0000 mL | INTRAVENOUS | Status: DC | PRN
  Administered 2018-09-20: 10 mL
  Filled 2018-09-20: qty 10

## 2018-09-20 NOTE — Progress Notes (Signed)
[facility]  Telephone:([phone] [phone]     ID: Anna Conrad DOB: 01/31/1966  MR#: [id number]  IHK#:[id number]  Patient Care Team: Filiberto Pinks as PCP - General (Physician Assistant) Magrinat, Virgie Dad, MD as Consulting Physician (Oncology) Kyung Rudd, MD as Consulting Physician (Radiation Oncology) [name], MD as Consulting Physician (General Surgery) Janyth Pupa, DO as Consulting Physician (Obstetrics and Gynecology) Janyth Pupa, DO as Consulting Physician (Obstetrics and Gynecology) OTHER MD:  CHIEF COMPLAINT: Estrogen receptor positive breast cancer  CURRENT TREATMENT: Adjuvant chemotherapy  INTERVAL HISTORY: Anna Conrad returns today for follow up and treatment of her estrogen receptor positive breast cancer.  She completed 4 cycles of Doxoribucin and Cyclophosphamide. She receives weekly paclitaxel, with today being day 1 cycle 7 out of 12 planned.Marland Kitchen  REVIEW OF SYSTEMS: Anna Conrad has noted some tingling in the left hand and both feet, and all of her fingertips.  She says this started faintly on Saturday following her chemotherapy, then Monday it worsened and has continued.  It is noted in both feet, mildly in the toes, and in the center of the soles of her feet.  She has not noted any balance changes, it is intermittent.  She doesn't note that it is worse at night.  She notes numbness in the fingertips that is intermittent, non painful, no motor deficits.  She says she has had carpal tunnel in the past. She notes her left hand is numb throughout.  She noted she had blood pressure issues with her first child and had associated numbness during that experience.  Interestingly she notes some numbness on the tip of her nose and on her tongue.    She is fatigued.  She otherwise is doing moderately well.  She has had some moderate constipation that has since worked itself out.    HISTORY OF CURRENT ILLNESS: From the original intake  note:  Anna Conrad had routine screening mammography on 01/29/2018 showing a possible mass with associated calcifications in the right breast. She underwent unilateral right diagnostic mammography with tomography and right breast ultrasonography at [facility] on 02/05/2018 showing a highly suspicious mass over the 10:30 position upper outer of the right breast located 6 cm from the nipple measuring 1.3 x 1.4 x 1.5 cm. Associated microcalcifications corresponding to the mammographic abnormality. Ultrasound of the right axilla was normal.  Accordingly on 02/29/2019 she proceeded to biopsy of the right breast area in question. 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 Prognostic indicators significant for: estrogen receptor, 100% positive with strong staining intensity and progesterone receptor, 20% positive iwht moderate staining intensity. Proliferation marker Ki67 at 10%. HER2 not amplified with ratios HER2/CEP17 signals 1.17 and average HER2 copies per cell 2.45.  On 02/21/2018 the patient had bilateral breast MRI.  This measured the upper outer quadrant right breast mass at 2.3 cm including an area of surrounding non-masslike enhancement.  There was no evidence of multifocal or multicentric disease, no lymphadenopathy, and no findings in the left breast.  The patient's subsequent history is as detailed below.    PAST MEDICAL HISTORY: Past Medical History:  Diagnosis Date  . Blood dyscrasia    hx blood clott superficial in rt arm  . Cancer [facility]) 2019   right breast cancer  . Family history of breast cancer   . Heart murmur   . Hypertension   She used to have migraines. She denies GERD, asthma, emphysema. She  was told that she had a slight heart murmur. She denies heart palpitations.   PAST SURGICAL HISTORY: Past Surgical History:  Procedure Laterality Date  . BREAST LUMPECTOMY WITH RADIOACTIVE SEED  AND SENTINEL LYMPH NODE BIOPSY Right 03/18/2018   Procedure: RIGHT BREAST PARTIAL MASTECTOMY WITH RADIOACTIVE SEED AND SENTINEL LYMPH NODE BIOPSY;  Surgeon: [name], MD;  Location: [facility];  Service: General;  Laterality: Right;  . cesearean section     tubal ligation   . IR CV LINE INJECTION  08/28/2018  . IR IMAGING GUIDED PORT INSERTION  09/04/2018  . IR REMOVAL TUN ACCESS W/ PORT W/O FL MOD SED  09/04/2018  . PORTACATH PLACEMENT Left 05/27/2018   Procedure: INSERTION PORT-A-CATH;  Surgeon: [name], MD;  Location: [facility];  Service: General;  Laterality: Left;  . TUBAL LIGATION      2 Caesarian sections- tubal ligation at the second c-section.  FAMILY HISTORY Family History  Problem Relation Age of Onset  . Breast cancer Sister 39       approximate  . Cancer Father 54       started in shoulder, spread to spine, brain, lung  . Cancer Paternal [name]        'blood cancer'  . Heart attack Paternal Uncle   . Breast cancer Cousin        age dx unk  . Breast cancer Cousin        age dx unk  . Breast cancer Cousin        age dx unk   The patient's father passed away in the summer 2018 at the age of 46 due to metastatic lung cancer. He was a previous smoker. The patient's mother is alive at age 72. The patient has 1 brother and 1 sister. The patient's sister was diagnosed with breast cancer at the age of 37.  The patient has 3 paternal cousins diagnosed with breast cancer. She denies a family history of ovarian cancer.    GYNECOLOGIC HISTORY:  The patient's periods currently are about twice a month.   Menarche: 52 years old. She used to be on a dance team.  Age at first live birth: 52 years old She is [place] P2. She was taking oral contraceptives, but this has been discontinued as of January 2019. She had bilateral tubal ligation at the time of her second c-section.    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  She has a PhD.  Her  husband, [name], is a Chief Strategy Officer for [employer], making metal parts. He travels about 2-3 times per year. Their sons are [name] age 47, and [name] age 62. She attends [employer].     ADVANCED DIRECTIVES:    HEALTH MAINTENANCE: Social History   Tobacco Use  . Smoking status: Never Smoker  . Smokeless tobacco: Never Used  Substance Use Topics  . Alcohol use: Yes    Comment: occ  . Drug use: Never     Colonoscopy:n/a  PAP: 2017/ normal  Bone density: n/a   Allergies  Allergen Reactions  . Adhesive [Tape] Rash  . Latex Rash    Red  rash    Current Outpatient Medications  Medication Sig Dispense Refill  . acetaminophen (TYLENOL) 500 MG tablet Take 1,000 mg by mouth every 6 (six) hours as needed (for pain.).    Marland Kitchen fluconazole (DIFLUCAN) 100 MG tablet Take 1 tablet (100 mg total) by mouth daily. 30 tablet  0  . ibuprofen (ADVIL,MOTRIN) 200 MG tablet Take 400 mg by mouth every 8 (eight) hours as needed (for pain.).    Marland Kitchen lidocaine-prilocaine (EMLA) cream Apply to affected area once 30 g 3  . lisinopril-hydrochlorothiazide (PRINZIDE,ZESTORETIC) 10-12.5 MG tablet Take 1 tablet by mouth daily.    Marland Kitchen LORazepam (ATIVAN) 0.5 MG tablet Take 1 tablet (0.5 mg total) by mouth at bedtime as needed (Nausea or vomiting). 30 tablet 0  . methylPREDNISolone (MEDROL DOSEPAK) 4 MG TBPK tablet Take as directed 21 tablet 0  . miconazole (MONISTAT 7) 2 % vaginal cream Place 1 Applicatorful vaginally at bedtime. 45 g 0  . prochlorperazine (COMPAZINE) 10 MG tablet Take 1 tablet (10 mg total) by mouth every 6 (six) hours as needed (Nausea or vomiting). 30 tablet 1  . triamcinolone (KENALOG) 0.025 % ointment Apply 1 application topically 2 (two) times daily. 30 g 0   No current facility-administered medications for this visit.     OBJECTIVE: Vitals:   09/20/18 1125  BP: 104/74  Pulse: 85  Resp: 18  Temp: 98.7 F (37.1 C)  SpO2: 99%     Body mass index is 31.7 kg/m.     Wt Readings from Last 3 Encounters:  09/20/18 184 lb 11.2 oz (83.8 kg)  09/13/18 185 lb 3.2 oz (84 kg)  09/06/18 188 lb 12.8 oz (85.6 kg)   ECOG FS:1 - Symptomatic but completely ambulatory GENERAL: Patient is a well appearing female in no acute distress HEENT:  Sclerae anicteric.  Oropharynx clear and moist. No ulcerations or evidence of oropharyngeal candidiasis. Neck is supple.  NODES:  No cervical, supraclavicular, or axillary lymphadenopathy palpated.  BREAST EXAM:  Right breast with small 0.5-1cm lesion in upper outer quadrant, this is smaller per patient, left breast benign LUNGS:  Clear to auscultation bilaterally.  No wheezes or rhonchi. HEART:  Regular rate and rhythm. No murmur appreciated. ABDOMEN:  Soft, nontender.  Positive, normoactive bowel sounds. No organomegaly palpated. MSK:  No focal spinal tenderness to palpation. Full range of motion bilaterally in the upper extremities. EXTREMITIES:  No peripheral edema.   SKIN:  Clear with no obvious rashes or skin changes. No nail dyscrasia. NEURO:  Nonfocal. Well oriented.  Appropriate affect.     LAB RESULTS:  CMP     Component Value Date/Time   NA 140 09/20/2018 1030   K 3.7 09/20/2018 1030   CL 104 09/20/2018 1030   CO2 26 09/20/2018 1030   GLUCOSE 87 09/20/2018 1030   BUN 12 09/20/2018 1030   CREATININE 0.84 09/20/2018 1030   CALCIUM 9.7 09/20/2018 1030   PROT 6.8 09/20/2018 1030   ALBUMIN 4.1 09/20/2018 1030   AST 25 09/20/2018 1030   ALT 30 09/20/2018 1030   ALKPHOS 52 09/20/2018 1030   BILITOT 0.6 09/20/2018 1030   GFRNONAA >60 09/20/2018 1030   GFRAA >60 09/20/2018 1030    No results found for: TOTALPROTELP, ALBUMINELP, A1GS, A2GS, BETS, BETA2SER, GAMS, MSPIKE, SPEI  No results found for: KPAFRELGTCHN, LAMBDASER, KAPLAMBRATIO  Lab Results  Component Value Date   WBC 3.3 (L) 09/20/2018   NEUTROABS 2.0 09/20/2018   HGB 10.2 (L) 09/20/2018   HCT 29.6 (L) 09/20/2018   MCV 101.0 (H) 09/20/2018    PLT 262 09/20/2018    @LASTCHEMISTRY @  No results found for: LABCA2  No components found for: JOACZY606  No results for input(s): INR in the last 168 hours.  No results found for: LABCA2  No results found for: TKZ601  No results found for: RWE315  No results found for: QMG867  No results found for: CA2729  No components found for: HGQUANT  No results found for: CEA1 / No results found for: CEA1   No results found for: AFPTUMOR  No results found for: CHROMOGRNA  No results found for: PSA1  Appointment on 09/20/2018  Component Date Value Ref Range Status  . Sodium 09/20/2018 140  135 - 145 mmol/L Final  . Potassium 09/20/2018 3.7  3.5 - 5.1 mmol/L Final  . Chloride 09/20/2018 104  98 - 111 mmol/L Final  . CO2 09/20/2018 26  22 - 32 mmol/L Final  . Glucose, Bld 09/20/2018 87  70 - 99 mg/dL Final  . BUN 09/20/2018 12  6 - 20 mg/dL Final  . Creatinine 09/20/2018 0.84  0.44 - 1.00 mg/dL Final  . Calcium 09/20/2018 9.7  8.9 - 10.3 mg/dL Final  . Total Protein 09/20/2018 6.8  6.5 - 8.1 g/dL Final  . Albumin 09/20/2018 4.1  3.5 - 5.0 g/dL Final  . AST 09/20/2018 25  15 - 41 U/L Final  . ALT 09/20/2018 30  0 - 44 U/L Final  . Alkaline Phosphatase 09/20/2018 52  38 - 126 U/L Final  . Total Bilirubin 09/20/2018 0.6  0.3 - 1.2 mg/dL Final  . GFR, Est Non Af Am 09/20/2018 >60  >60 mL/min Final  . GFR, Est AFR Am 09/20/2018 >60  >60 mL/min Final   Comment: (NOTE) The eGFR has been calculated using the CKD EPI equation. This calculation has not been validated in all clinical situations. eGFR's persistently <60 mL/min signify possible Chronic Kidney Disease.   Georgiann Hahn gap 09/20/2018 10  5 - 15 Final   Performed at [facility], [place] [address] [place], [place] [address]  . WBC Count 09/20/2018 3.3* 4.0 - 10.5 K/uL Final  . RBC 09/20/2018 2.93* 3.87 - 5.11 MIL/uL Final  . Hemoglobin 09/20/2018 10.2* 12.0 - 15.0 g/dL Final  . HCT 09/20/2018 29.6* 36.0  - 46.0 % Final  . MCV 09/20/2018 101.0* 80.0 - 100.0 fL Final  . MCH 09/20/2018 34.8* 26.0 - 34.0 pg Final  . MCHC 09/20/2018 34.5  30.0 - 36.0 g/dL Final  . RDW 09/20/2018 14.5  11.5 - 15.5 % Final  . Platelet Count 09/20/2018 262  150 - 400 K/uL Final  . nRBC 09/20/2018 0.0  0.0 - 0.2 % Final  . Neutrophils Relative % 09/20/2018 59  % Final  . Neutro Abs 09/20/2018 2.0  1.7 - 7.7 K/uL Final  . Lymphocytes Relative 09/20/2018 27  % Final  . Lymphs Abs 09/20/2018 0.9  0.7 - 4.0 K/uL Final  . Monocytes Relative 09/20/2018 8  % Final  . Monocytes Absolute 09/20/2018 0.3  0.1 - 1.0 K/uL Final  . Eosinophils Relative 09/20/2018 5  % Final  . Eosinophils Absolute 09/20/2018 0.2  0.0 - 0.5 K/uL Final  . Basophils Relative 09/20/2018 1  % Final  . Basophils Absolute 09/20/2018 0.0  0.0 - 0.1 K/uL Final  . Immature Granulocytes 09/20/2018 0  % Final  . Abs Immature Granulocytes 09/20/2018 0.01  0.00 - 0.07 K/uL Final   Performed at [facility], [place] [address] [place], [place] [address]    (this displays the last labs from the last 3 days)  No results found for: TOTALPROTELP, ALBUMINELP, A1GS, A2GS, BETS, BETA2SER, GAMS, MSPIKE, SPEI (this displays SPEP labs)  No results found for: KPAFRELGTCHN, LAMBDASER, KAPLAMBRATIO (kappa/lambda  light chains)  No results found for: HGBA, HGBA2QUANT, HGBFQUANT, HGBSQUAN (Hemoglobinopathy evaluation)   No results found for: LDH  No results found for: IRON, TIBC, IRONPCTSAT (Iron and TIBC)  No results found for: FERRITIN  Urinalysis    Component Value Date/Time   COLORURINE YELLOW 06/25/2018 0815   APPEARANCEUR HAZY (A) 06/25/2018 0815   LABSPEC 1.019 06/25/2018 0815   PHURINE 6.0 06/25/2018 0815   GLUCOSEU NEGATIVE 06/25/2018 0815   HGBUR NEGATIVE 06/25/2018 0815   BILIRUBINUR NEGATIVE 06/25/2018 0815   KETONESUR NEGATIVE 06/25/2018 0815   PROTEINUR NEGATIVE 06/25/2018 0815   NITRITE NEGATIVE 06/25/2018 0815    LEUKOCYTESUR NEGATIVE 06/25/2018 0815     STUDIES:   ELIGIBLE FOR AVAILABLE RESEARCH PROTOCOL: BCEP   ASSESSMENT: 52 y.o. [place], [place] woman status post right breast upper outer quadrant biopsy 02/07/2018 for a clinical T1c-T2 N0, stage IA-B invasive ductal carcinoma, grade 2, estrogen receptor and progesterone receptor positive, HER-2 not amplified, with an MIB-1 of 10%  (1) status post right lumpectomy 03/18/2018 for a pT1c pN0, stage IA invasive ductal carcinoma, grade 2, estrogen and progesterone receptor positive, with negative margins.  A total of 1 lymph node was removed  (2) Mammaprint on 03/18/2018 showed high risk, indicating that with chemotherapy and hormone therapy, the patient would have a nearly 95% chance of having no distant metastases within the next 5 years  (3) chemotherapy will consist of doxorubicin and cyclophosphamide in dose dense fashion x4 starting 06/04/2018, followed by weekly paclitaxel x12 started 08/09/2018.  (a) baseline echocardiogram on 05/23/2018 showed an ejection fraction in the 65-75% range  (4) adjuvant radiation to follow  (5) antiestrogens to follow at the completion of local treatment  (6) genetics testing 03/13/2018 through the Common Hereditary Cancer Panel offered by [facility] ifound no deleterious mutations in APC, ATM, AXIN2, BARD1, BMPR1A, BRCA1, BRCA2, BRIP1, CDH1, CDKN2A (p14ARF), CDKN2A (p16INK4a), CKD4, CHEK2, CTNNA1, DICER1, EPCAM (Deletion/duplication testing only), GREM1 (promoter region deletion/duplication testing only), KIT, MEN1, MLH1, MSH2, MSH3, MSH6, MUTYH, NBN, NF1, NHTL1, PALB2, PDGFRA, PMS2, POLD1, POLE, PTEN, RAD50, RAD51C, RAD51D, SDHB, SDHC, SDHD, SMAD4, SMARCA4. STK11, TP53, TSC1, TSC2, and VHL.  The following genes were evaluated for sequence changes only: SDHA and HOXB13 c.251G>A variant only.  (a)  A variant of uncertain significance (VUS) in the gene MSH3 was also identified c.1027+4T>C (Intronic).    PLAN:  Kamyra is  doing moderately well today.  She is having a new peripheral neuropathy.  This is intermittent.  I reviewed the neuropathy and breast concern with Dr. Jana Hakim.  We will hold treatment today.  We will also get a mammogram and ultrasound of her breast to fully evaluate.    She and I reviewed that she may have to stop the Paclitaxel early.  She understands this.  She will return in one week for labs, f/u with Dr. Jana Hakim, and chemotherapy.  She knows to call for any other issues that may develop before the next visit.  A total of (20) minutes of face-to-face time was spent with this patient with greater than 50% of that time in counseling and care-coordination.  Wilber Bihari, NP  09/20/18 1:22 PM Medical Oncology and Hematology [facility] [address] [place], [place] [address] Tel. [phone]    Fax. [phone]

## 2018-09-23 ENCOUNTER — Telehealth: Payer: Self-pay | Admitting: Adult Health

## 2018-09-23 NOTE — Telephone Encounter (Signed)
Per 10/11 no los

## 2018-09-24 ENCOUNTER — Telehealth (HOSPITAL_COMMUNITY): Payer: Self-pay | Admitting: Vascular Surgery

## 2018-09-24 NOTE — Telephone Encounter (Signed)
Left pt message give new brst appt 10/22 @ 2 for echo 3:20 to see Mclean, asked pt in VM to call back ASAP to confirm appt

## 2018-09-24 NOTE — Progress Notes (Signed)
Pt is schedule she needs echo order thanks

## 2018-09-25 ENCOUNTER — Other Ambulatory Visit: Payer: Self-pay | Admitting: *Deleted

## 2018-09-25 DIAGNOSIS — Z170 Estrogen receptor positive status [ER+]: Principal | ICD-10-CM

## 2018-09-25 DIAGNOSIS — C50411 Malignant neoplasm of upper-outer quadrant of right female breast: Secondary | ICD-10-CM

## 2018-09-26 NOTE — Progress Notes (Signed)
[facility]  [phone] [phone]     ID: Anna Conrad DOB: 1966-05-29  MR#: [id number]  GUY#:[id number]  Patient Care Team: Filiberto Pinks as PCP - General (Physician Assistant) Tashai Catino, Virgie Dad, MD as Consulting Physician (Oncology) Kyung Rudd, MD as Consulting Physician (Radiation Oncology) [name], MD as Consulting Physician (General Surgery) Janyth Pupa, DO as Consulting Physician (Obstetrics and Gynecology) Janyth Pupa, DO as Consulting Physician (Obstetrics and Gynecology) OTHER MD:  CHIEF COMPLAINT: Estrogen receptor positive breast cancer  CURRENT TREATMENT: Adjuvant radiation pending  INTERVAL HISTORY: Anna Conrad returns today for follow up and treatment of her estrogen receptor positive breast cancer accompanied by her husband. She completed 4 cycles of Doxoribucin and Cyclophosphamide. She receives weekly paclitaxel, with 09/20/2018 being day 1 cycle 7. However that treatment was held due to peripheral neuropathy.   Her port was malpositioned and was replaced by interventional radiology 09/04/2018.   REVIEW OF SYSTEMS: Anna Conrad has no tingling and numbness in her hands, but significant residual tingling and numbness in her feet.  This extends to the middle of the foot.  It does not cause her to lose her balance or fall.  It used to be worse at night.  Now it is a little bit better.  It can come and go during the day.  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  Otherwise she denies problems with nausea, vomiting, cough, phlegm production, pleurisy, shortness of breath, or change in bladder habits.  She has some chronic constipation issues.  She is currently not exercising.  A detailed review of systems today was otherwise stable   HISTORY OF CURRENT ILLNESS: From the original intake note:  Anna Conrad had routine screening mammography on 01/29/2018 showing a possible mass with  associated calcifications in the right breast. She underwent unilateral right diagnostic mammography with tomography and right breast ultrasonography at [facility] on 02/05/2018 showing a highly suspicious mass over the 10:30 position upper outer of the right breast located 6 cm from the nipple measuring 1.3 x 1.4 x 1.5 cm. Associated microcalcifications corresponding to the mammographic abnormality. Ultrasound of the right axilla was normal.  Accordingly on 02/29/2019 she proceeded to biopsy of the right breast area in question. 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 Prognostic indicators significant for: estrogen receptor, 100% positive with strong staining intensity and progesterone receptor, 20% positive iwht moderate staining intensity. Proliferation marker Ki67 at 10%. HER2 not amplified with ratios HER2/CEP17 signals 1.17 and average HER2 copies per cell 2.45.  On 02/21/2018 the patient had bilateral breast MRI.  This measured the upper outer quadrant right breast mass at 2.3 cm including an area of surrounding non-masslike enhancement.  There was no evidence of multifocal or multicentric disease, no lymphadenopathy, and no findings in the left breast.  The patient's subsequent history is as detailed below.    PAST MEDICAL HISTORY: Past Medical History:  Diagnosis Date  . Blood dyscrasia    hx blood clott superficial in rt arm  . Cancer [facility]) 2019   right breast cancer  . Family history of breast cancer   . Heart murmur   . Hypertension   She used to have migraines. She denies GERD, asthma, emphysema. She was told that she had a slight heart murmur. She denies heart palpitations.   PAST SURGICAL HISTORY: Past Surgical History:  Procedure Laterality Date  .  BREAST LUMPECTOMY WITH RADIOACTIVE SEED AND SENTINEL LYMPH NODE BIOPSY Right 03/18/2018   Procedure: RIGHT BREAST PARTIAL MASTECTOMY WITH  RADIOACTIVE SEED AND SENTINEL LYMPH NODE BIOPSY;  Surgeon: [name], MD;  Location: [facility];  Service: General;  Laterality: Right;  . cesearean section     tubal ligation   . IR CV LINE INJECTION  08/28/2018  . IR IMAGING GUIDED PORT INSERTION  09/04/2018  . IR REMOVAL TUN ACCESS W/ PORT W/O FL MOD SED  09/04/2018  . PORTACATH PLACEMENT Left 05/27/2018   Procedure: INSERTION PORT-A-CATH;  Surgeon: [name], MD;  Location: [facility];  Service: General;  Laterality: Left;  . TUBAL LIGATION      2 Caesarian sections- tubal ligation at the second c-section.  FAMILY HISTORY Family History  Problem Relation Age of Onset  . Breast cancer Sister 36       approximate  . Cancer Father 61       started in shoulder, spread to spine, brain, lung  . Cancer Paternal [name]        'blood cancer'  . Heart attack Paternal Uncle   . Breast cancer Cousin        age dx unk  . Breast cancer Cousin        age dx unk  . Breast cancer Cousin        age dx unk   The patient's father passed away in the summer 2018 at the age of 43 due to metastatic lung cancer. He was a previous smoker. The patient's mother is alive at age 60. The patient has 1 brother and 1 sister. The patient's sister was diagnosed with breast cancer at the age of 25.  The patient has 3 paternal cousins diagnosed with breast cancer. She denies a family history of ovarian cancer.    GYNECOLOGIC HISTORY:  The patient's periods currently are about twice a month.   Menarche: 52 years old. She used to be on a dance team.  Age at first live birth: 52 years old She is [place] P2. She was taking oral contraceptives, but this has been discontinued as of January 2019. She had bilateral tubal ligation at the time of her second c-section.    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  She has a PhD.  Her husband, [name], is a Chief Strategy Officer for [employer], making metal parts. He travels about  2-3 times per year. Their sons are [name] age 73, and [name] age 1. She attends [employer].     ADVANCED DIRECTIVES:    HEALTH MAINTENANCE: Social History   Tobacco Use  . Smoking status: Never Smoker  . Smokeless tobacco: Never Used  Substance Use Topics  . Alcohol use: Yes    Comment: occ  . Drug use: Never     Colonoscopy:n/a  PAP: 2017/ normal  Bone density: n/a   Allergies  Allergen Reactions  . Adhesive [Tape] Rash  . Latex Rash    Red  rash    Current Outpatient Medications  Medication Sig Dispense Refill  . acetaminophen (TYLENOL) 500 MG tablet Take 1,000 mg by mouth every 6 (six) hours as needed (for pain.).    Marland Kitchen fluconazole (DIFLUCAN) 100 MG tablet Take 1 tablet (100 mg total) by mouth daily. 30 tablet 0  . ibuprofen (ADVIL,MOTRIN) 200 MG tablet Take 400 mg by mouth every 8 (eight) hours as needed (for pain.).    Marland Kitchen lidocaine-prilocaine (EMLA)  cream Apply to affected area once 30 g 3  . lisinopril-hydrochlorothiazide (PRINZIDE,ZESTORETIC) 10-12.5 MG tablet Take 1 tablet by mouth daily.    Marland Kitchen LORazepam (ATIVAN) 0.5 MG tablet Take 1 tablet (0.5 mg total) by mouth at bedtime as needed (Nausea or vomiting). 30 tablet 0  . methylPREDNISolone (MEDROL DOSEPAK) 4 MG TBPK tablet Take as directed 21 tablet 0  . miconazole (MONISTAT 7) 2 % vaginal cream Place 1 Applicatorful vaginally at bedtime. 45 g 0  . prochlorperazine (COMPAZINE) 10 MG tablet Take 1 tablet (10 mg total) by mouth every 6 (six) hours as needed (Nausea or vomiting). 30 tablet 1  . triamcinolone (KENALOG) 0.025 % ointment Apply 1 application topically 2 (two) times daily. 30 g 0   No current facility-administered medications for this visit.     OBJECTIVE: Latest white woman in no acute distress  Vitals:   09/27/18 1057  BP: 103/75  Pulse: 84  Resp: 19  Temp: 98.2 F (36.8 C)  SpO2: 97%     Body mass index is 32.13 kg/m.   Wt Readings from Last 3 Encounters:  09/27/18 187 lb  3.2 oz (84.9 kg)  09/20/18 184 lb 11.2 oz (83.8 kg)  09/13/18 185 lb 3.2 oz (84 kg)   ECOG FS:1 - Symptomatic but completely ambulatory  Sclerae unicteric, EOMs intact Oropharynx clear and moist No cervical or supraclavicular adenopathy Lungs no rales or rhonchi Heart regular rate and rhythm Abd soft, nontender, positive bowel sounds MSK no focal spinal tenderness, no upper extremity lymphedema Neuro: nonfocal, well oriented, appropriate affect Breasts: The right breast is status post lumpectomy.  The cosmetic result is excellent.  There is a palpable mass in the upper outer quadrant which is likely scar tissue and will require follow-up.  There is no erythema or nipple change.  Left breast is benign.  Both axillae are benign.    LAB RESULTS:  CMP     Component Value Date/Time   NA 140 09/20/2018 1030   K 3.7 09/20/2018 1030   CL 104 09/20/2018 1030   CO2 26 09/20/2018 1030   GLUCOSE 87 09/20/2018 1030   BUN 12 09/20/2018 1030   CREATININE 0.84 09/20/2018 1030   CALCIUM 9.7 09/20/2018 1030   PROT 6.8 09/20/2018 1030   ALBUMIN 4.1 09/20/2018 1030   AST 25 09/20/2018 1030   ALT 30 09/20/2018 1030   ALKPHOS 52 09/20/2018 1030   BILITOT 0.6 09/20/2018 1030   GFRNONAA >60 09/20/2018 1030   GFRAA >60 09/20/2018 1030    No results found for: TOTALPROTELP, ALBUMINELP, A1GS, A2GS, BETS, BETA2SER, GAMS, MSPIKE, SPEI  No results found for: KPAFRELGTCHN, LAMBDASER, KAPLAMBRATIO  Lab Results  Component Value Date   WBC 4.9 09/27/2018   NEUTROABS 3.0 09/27/2018   HGB 10.8 (L) 09/27/2018   HCT 32.2 (L) 09/27/2018   MCV 101.6 (H) 09/27/2018   PLT 280 09/27/2018    @LASTCHEMISTRY @  No results found for: LABCA2  No components found for: [id number]  No results for input(s): INR in the last 168 hours.  No results found for: LABCA2  No results found for: FBX038  No results found for: BFX832  No results found for: NVB166  No results found for: CA2729  No components  found for: HGQUANT  No results found for: CEA1 / No results found for: CEA1   No results found for: AFPTUMOR  No results found for: [place]  No results found for: PSA1  Appointment on 09/27/2018  Component Date Value Ref Range  Status  . WBC Count 09/27/2018 4.9  4.0 - 10.5 K/uL Final  . RBC 09/27/2018 3.17* 3.87 - 5.11 MIL/uL Final  . Hemoglobin 09/27/2018 10.8* 12.0 - 15.0 g/dL Final  . HCT 09/27/2018 32.2* 36.0 - 46.0 % Final  . MCV 09/27/2018 101.6* 80.0 - 100.0 fL Final  . MCH 09/27/2018 34.1* 26.0 - 34.0 pg Final  . MCHC 09/27/2018 33.5  30.0 - 36.0 g/dL Final  . RDW 09/27/2018 13.8  11.5 - 15.5 % Final  . Platelet Count 09/27/2018 280  150 - 400 K/uL Final  . nRBC 09/27/2018 0.0  0.0 - 0.2 % Final  . Neutrophils Relative % 09/27/2018 61  % Final  . Neutro Abs 09/27/2018 3.0  1.7 - 7.7 K/uL Final  . Lymphocytes Relative 09/27/2018 21  % Final  . Lymphs Abs 09/27/2018 1.0  0.7 - 4.0 K/uL Final  . Monocytes Relative 09/27/2018 14  % Final  . Monocytes Absolute 09/27/2018 0.7  0.1 - 1.0 K/uL Final  . Eosinophils Relative 09/27/2018 3  % Final  . Eosinophils Absolute 09/27/2018 0.2  0.0 - 0.5 K/uL Final  . Basophils Relative 09/27/2018 1  % Final  . Basophils Absolute 09/27/2018 0.0  0.0 - 0.1 K/uL Final  . Immature Granulocytes 09/27/2018 0  % Final  . Abs Immature Granulocytes 09/27/2018 0.01  0.00 - 0.07 K/uL Final   Performed at [facility], [address] [place], [place] [address]    (this displays the last labs from the last 3 days)  No results found for: TOTALPROTELP, ALBUMINELP, A1GS, A2GS, BETS, BETA2SER, GAMS, MSPIKE, SPEI (this displays SPEP labs)  No results found for: KPAFRELGTCHN, LAMBDASER, KAPLAMBRATIO (kappa/lambda light chains)  No results found for: HGBA, HGBA2QUANT, HGBFQUANT, HGBSQUAN (Hemoglobinopathy evaluation)   No results found for: LDH  No results found for: IRON, TIBC, IRONPCTSAT (Iron and TIBC)  No  results found for: FERRITIN  Urinalysis    Component Value Date/Time   COLORURINE YELLOW 06/25/2018 0815   APPEARANCEUR HAZY (A) 06/25/2018 0815   LABSPEC 1.019 06/25/2018 0815   PHURINE 6.0 06/25/2018 0815   GLUCOSEU NEGATIVE 06/25/2018 0815   HGBUR NEGATIVE 06/25/2018 0815   BILIRUBINUR NEGATIVE 06/25/2018 0815   KETONESUR NEGATIVE 06/25/2018 0815   PROTEINUR NEGATIVE 06/25/2018 0815   NITRITE NEGATIVE 06/25/2018 0815   LEUKOCYTESUR NEGATIVE 06/25/2018 0815     STUDIES: Ir Removal Tun Access W/ [place] W/o Fl Mod Sed  Result Date: 09/04/2018 CLINICAL DATA:  History of breast carcinoma. Left subclavian Port-A-Cath placed surgically on 05/27/2018 is now malpositioned with catheter tip oriented transversely in the brachiocephalic vein and along the lateral wall of the upper SVC. New port placement required for further chemotherapy. EXAM: 1. IMPLANTED PORT A CATH PLACEMENT WITH ULTRASOUND AND FLUOROSCOPIC GUIDANCE 2. REMOVAL OF IMPLANTED LEFT CHEST TUNNELED CENTRAL VENOUS PORT-A-CATH ANESTHESIA/SEDATION: 4.0 mg IV Versed; 100 mcg IV Fentanyl Total Moderate Sedation Time:  68 minutes The patient's level of consciousness and physiologic status were continuously monitored during the procedure by Radiology nursing. Additional Medications: 2 g IV Ancef. FLUOROSCOPY TIME:  24 seconds.  5.0 mGy. PROCEDURE: The procedure, risks, benefits, and alternatives were explained to the patient. Questions regarding the procedure were encouraged and answered. The patient understands and consents to the procedure. A time-out was performed prior to initiating the procedure. Ultrasound was utilized to confirm patency of the right internal jugular vein. The right neck and chest were prepped with chlorhexidine in a sterile fashion, and a sterile  drape was applied covering the operative field. Maximum barrier sterile technique with sterile gowns and gloves were used for the procedure. Local anesthesia was provided with 1%  lidocaine. After creating a small venotomy incision, a 21 gauge needle was advanced into the right internal jugular vein under direct, real-time ultrasound guidance. Ultrasound image documentation was performed. After securing guidewire access, an 8 Fr dilator was placed. A J-wire was kinked to measure appropriate catheter length. A subcutaneous port pocket was then created along the upper chest wall utilizing sharp and blunt dissection. Portable cautery was utilized. The pocket was irrigated with sterile saline. A single lumen power injectable port was chosen for placement. The 8 Fr catheter was tunneled from the port pocket site to the venotomy incision. The port was placed in the pocket. External catheter was trimmed to appropriate length based on guidewire measurement. At the venotomy, an 8 Fr peel-away sheath was placed over a guidewire. The catheter was then placed through the sheath and the sheath removed. Final catheter positioning was confirmed and documented with a fluoroscopic spot image. The port was accessed with a needle and aspirated and flushed with heparinized saline. The access needle was removed. The venotomy and port pocket incisions were closed with subcutaneous 3-0 Monocryl and subcuticular 4-0 Vicryl. Dermabond was applied to both incisions. The skin overlying the left chest port reservoir was then prepped with chlorhexidine. Local anesthesia was provided with 1% lidocaine. A skin incision was made with a #15 scalpel. Utilizing sharp and blunt dissection, the indwelling left chest subclavian Port-A-Cath was removed. Portable cautery was utilized. The pocket was irrigated with sterile saline. The incision was closed with subcutaneous 3-0 Monocryl and subcuticular 4-0 Vicryl. Dermabond was applied to the incision. COMPLICATIONS: COMPLICATIONS None FINDINGS: After new port placement via the right internal jugular vein, the tip lies at the cavo-atrial junction. The catheter aspirates normally and  is ready for immediate use. The left chest port was removed in its entirety without difficulty. IMPRESSION: 1. Placement of single lumen port a cath via right internal jugular vein. The catheter tip lies at the cavo-atrial junction. A power injectable port a cath was placed and is ready for immediate use. 2. Removal of malpositioned left chest port utilizing sharp and blunt dissection. Electronically Signed   By: Aletta Edouard M.D.   On: 09/04/2018 16:42   Ir Cv Line Injection  Result Date: 08/28/2018 INDICATION: 52 year old with right breast cancer and left chest Port-A-Cath. Port-A-Cath is not aspirating well. Patient presents for port injection and evaluation. EXAM: PORT-A-CATH LINE INJECTION MEDICATIONS: None ANESTHESIA/SEDATION: None FLUOROSCOPY TIME:  6 seconds, 941 mGy COMPLICATIONS: None immediate. PROCEDURE: The left chest Port-A-Cath was accessed using sterile technique. Port would flush but would not aspirate. Contrast was injected through the port. Port injection was performed under fluoroscopy. The catheter was flushed with heparinized saline and the port needle was removed. CONTRAST:  5 mL Isovue-300 FINDINGS: Left subclavian Port-A-Cath tip has migrated cephalad and located at the junction of the right innominate vein and SVC. Catheter is against the vein wall. The SVC and right atrium are patent. No evidence for port discontinuity. IMPRESSION: Malpositioned Port-A-Cath tip. Catheter tip has migrated cephalad and located at the junction of the right innominate vein and SVC. Catheter tip is against the vein wall. Recommend revision of the left chest Port-A-Cath. Electronically Signed   By: Markus Daft M.D.   On: 08/28/2018 12:50   Ir Imaging Guided Port Insertion  Result Date: 09/04/2018 CLINICAL DATA:  History  of breast carcinoma. Left subclavian Port-A-Cath placed surgically on 05/27/2018 is now malpositioned with catheter tip oriented transversely in the brachiocephalic vein and along the  lateral wall of the upper SVC. New port placement required for further chemotherapy. EXAM: 1. IMPLANTED PORT A CATH PLACEMENT WITH ULTRASOUND AND FLUOROSCOPIC GUIDANCE 2. REMOVAL OF IMPLANTED LEFT CHEST TUNNELED CENTRAL VENOUS PORT-A-CATH ANESTHESIA/SEDATION: 4.0 mg IV Versed; 100 mcg IV Fentanyl Total Moderate Sedation Time:  68 minutes The patient's level of consciousness and physiologic status were continuously monitored during the procedure by Radiology nursing. Additional Medications: 2 g IV Ancef. FLUOROSCOPY TIME:  24 seconds.  5.0 mGy. PROCEDURE: The procedure, risks, benefits, and alternatives were explained to the patient. Questions regarding the procedure were encouraged and answered. The patient understands and consents to the procedure. A time-out was performed prior to initiating the procedure. Ultrasound was utilized to confirm patency of the right internal jugular vein. The right neck and chest were prepped with chlorhexidine in a sterile fashion, and a sterile drape was applied covering the operative field. Maximum barrier sterile technique with sterile gowns and gloves were used for the procedure. Local anesthesia was provided with 1% lidocaine. After creating a small venotomy incision, a 21 gauge needle was advanced into the right internal jugular vein under direct, real-time ultrasound guidance. Ultrasound image documentation was performed. After securing guidewire access, an 8 Fr dilator was placed. A J-wire was kinked to measure appropriate catheter length. A subcutaneous port pocket was then created along the upper chest wall utilizing sharp and blunt dissection. Portable cautery was utilized. The pocket was irrigated with sterile saline. A single lumen power injectable port was chosen for placement. The 8 Fr catheter was tunneled from the port pocket site to the venotomy incision. The port was placed in the pocket. External catheter was trimmed to appropriate length based on guidewire  measurement. At the venotomy, an 8 Fr peel-away sheath was placed over a guidewire. The catheter was then placed through the sheath and the sheath removed. Final catheter positioning was confirmed and documented with a fluoroscopic spot image. The port was accessed with a needle and aspirated and flushed with heparinized saline. The access needle was removed. The venotomy and port pocket incisions were closed with subcutaneous 3-0 Monocryl and subcuticular 4-0 Vicryl. Dermabond was applied to both incisions. The skin overlying the left chest port reservoir was then prepped with chlorhexidine. Local anesthesia was provided with 1% lidocaine. A skin incision was made with a #15 scalpel. Utilizing sharp and blunt dissection, the indwelling left chest subclavian Port-A-Cath was removed. Portable cautery was utilized. The pocket was irrigated with sterile saline. The incision was closed with subcutaneous 3-0 Monocryl and subcuticular 4-0 Vicryl. Dermabond was applied to the incision. COMPLICATIONS: COMPLICATIONS None FINDINGS: After new port placement via the right internal jugular vein, the tip lies at the cavo-atrial junction. The catheter aspirates normally and is ready for immediate use. The left chest port was removed in its entirety without difficulty. IMPRESSION: 1. Placement of single lumen port a cath via right internal jugular vein. The catheter tip lies at the cavo-atrial junction. A power injectable port a cath was placed and is ready for immediate use. 2. Removal of malpositioned left chest port utilizing sharp and blunt dissection. Electronically Signed   By: Aletta Edouard M.D.   On: 09/04/2018 16:42     ELIGIBLE FOR AVAILABLE RESEARCH PROTOCOL: BCEP   ASSESSMENT: 52 y.o. [place], [place] woman status post right breast upper outer quadrant biopsy 02/07/2018  for a clinical T1c-T2 N0, stage IA-B invasive ductal carcinoma, grade 2, estrogen receptor and progesterone receptor positive, HER-2 not amplified,  with an MIB-1 of 10%  (1) status post right lumpectomy 03/18/2018 for a pT1c pN0, stage IA invasive ductal carcinoma, grade 2, estrogen and progesterone receptor positive, with negative margins.  A total of 1 lymph node was removed  (2) Mammaprint on 03/18/2018 showed high risk, indicating that with chemotherapy and hormone therapy, the patient would have a nearly 95% chance of having no distant metastases within the next 5 years  (3) chemotherapy consisting of doxorubicin and cyclophosphamide in dose dense fashion x4 started 06/04/2018, completed 07/23/2018 followed by weekly paclitaxel started 08/09/2018, discontinued 09/13/2018  (a) baseline echocardiogram on 05/23/2018 showed an ejection fraction in the 65-75% range  (b) adjuvant paclitaxel discontinued after 6 cycles because of neuropathy  (4) adjuvant radiation to follow  (5) antiestrogens to follow at the completion of local treatment  (6) genetics testing 03/13/2018 through the Common Hereditary Cancer Panel offered by [facility] ifound no deleterious mutations in APC, ATM, AXIN2, BARD1, BMPR1A, BRCA1, BRCA2, BRIP1, CDH1, CDKN2A (p14ARF), CDKN2A (p16INK4a), CKD4, CHEK2, CTNNA1, DICER1, EPCAM (Deletion/duplication testing only), GREM1 (promoter region deletion/duplication testing only), KIT, MEN1, MLH1, MSH2, MSH3, MSH6, MUTYH, NBN, NF1, NHTL1, PALB2, PDGFRA, PMS2, POLD1, POLE, PTEN, RAD50, RAD51C, RAD51D, SDHB, SDHC, SDHD, SMAD4, SMARCA4. STK11, TP53, TSC1, TSC2, and VHL.  The following genes were evaluated for sequence changes only: SDHA and HOXB13 c.251G>A variant only.  (a)  A variant of uncertain significance (VUS) in the gene MSH3 was also identified c.1027+4T>C (Intronic).    PLAN:  Anna Conrad was able to receive most of her chemotherapy and I reassured her that she likely obtained most of the benefit from that form of systemic treatment.  It is unfortunate that we just replaced her port as she really does not need it.  The plan is to  keep the port for now and consider removing it at after she completes radiation.  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  Between 11 in the morning and 2:30 PM.  If her radiation treatments could be around noon or 1 PM that would be optimal.  Today we discussed radiation issues in detail and started to discuss antiestrogens.  I am canceling the treatment and visit appointments that she already had scheduled with her Taxol treatments.  She is going to see me again in January and at that point we will likely start anastrozole.  She knows to call for any other problems that may develop before the next visit.      Anna Conrad, Virgie Dad, MD  09/27/18 11:24 AM Medical Oncology and Hematology [facility] [address] [place], [place] [address] Tel. [phone]    Fax. [phone]  Alice Rieger, am acting as scribe for Chauncey Cruel MD.  I, Lurline Del MD, have reviewed the above documentation for accuracy and completeness, and I agree with the above.

## 2018-09-27 ENCOUNTER — Telehealth: Payer: Self-pay

## 2018-09-27 ENCOUNTER — Inpatient Hospital Stay: Payer: BC Managed Care – PPO

## 2018-09-27 ENCOUNTER — Inpatient Hospital Stay (HOSPITAL_BASED_OUTPATIENT_CLINIC_OR_DEPARTMENT_OTHER): Payer: BC Managed Care – PPO | Admitting: Oncology

## 2018-09-27 VITALS — BP 103/75 | HR 84 | Temp 98.2°F | Resp 19 | Ht 64.0 in | Wt 187.2 lb

## 2018-09-27 DIAGNOSIS — G629 Polyneuropathy, unspecified: Secondary | ICD-10-CM | POA: Diagnosis not present

## 2018-09-27 DIAGNOSIS — Z5111 Encounter for antineoplastic chemotherapy: Secondary | ICD-10-CM | POA: Diagnosis not present

## 2018-09-27 DIAGNOSIS — Z95828 Presence of other vascular implants and grafts: Secondary | ICD-10-CM

## 2018-09-27 DIAGNOSIS — Z170 Estrogen receptor positive status [ER+]: Secondary | ICD-10-CM

## 2018-09-27 DIAGNOSIS — C50411 Malignant neoplasm of upper-outer quadrant of right female breast: Secondary | ICD-10-CM

## 2018-09-27 LAB — CBC WITH DIFFERENTIAL (CANCER CENTER ONLY)
Abs Immature Granulocytes: 0.01 10*3/uL (ref 0.00–0.07)
Basophils Absolute: 0 10*3/uL (ref 0.0–0.1)
Basophils Relative: 1 %
EOS ABS: 0.2 10*3/uL (ref 0.0–0.5)
EOS PCT: 3 %
HCT: 32.2 % — ABNORMAL LOW (ref 36.0–46.0)
HEMOGLOBIN: 10.8 g/dL — AB (ref 12.0–15.0)
Immature Granulocytes: 0 %
LYMPHS ABS: 1 10*3/uL (ref 0.7–4.0)
LYMPHS PCT: 21 %
MCH: 34.1 pg — ABNORMAL HIGH (ref 26.0–34.0)
MCHC: 33.5 g/dL (ref 30.0–36.0)
MCV: 101.6 fL — ABNORMAL HIGH (ref 80.0–100.0)
MONO ABS: 0.7 10*3/uL (ref 0.1–1.0)
MONOS PCT: 14 %
Neutro Abs: 3 10*3/uL (ref 1.7–7.7)
Neutrophils Relative %: 61 %
Platelet Count: 280 10*3/uL (ref 150–400)
RBC: 3.17 MIL/uL — ABNORMAL LOW (ref 3.87–5.11)
RDW: 13.8 % (ref 11.5–15.5)
WBC Count: 4.9 10*3/uL (ref 4.0–10.5)
nRBC: 0 % (ref 0.0–0.2)

## 2018-09-27 LAB — CMP (CANCER CENTER ONLY)
ALK PHOS: 52 U/L (ref 38–126)
ALT: 21 U/L (ref 0–44)
AST: 22 U/L (ref 15–41)
Albumin: 3.9 g/dL (ref 3.5–5.0)
Anion gap: 8 (ref 5–15)
BUN: 15 mg/dL (ref 6–20)
CALCIUM: 9.6 mg/dL (ref 8.9–10.3)
CO2: 29 mmol/L (ref 22–32)
CREATININE: 0.87 mg/dL (ref 0.44–1.00)
Chloride: 104 mmol/L (ref 98–111)
GFR, Est AFR Am: >60 mL/min (ref >60)
GFR, Estimated: >60 mL/min (ref >60)
GLUCOSE: 91 mg/dL (ref 70–99)
Potassium: 3.7 mmol/L (ref 3.5–5.1)
SODIUM: 141 mmol/L (ref 135–145)
Total Bilirubin: 0.6 mg/dL (ref 0.3–1.2)
Total Protein: 6.8 g/dL (ref 6.5–8.1)

## 2018-09-27 MED ORDER — SODIUM CHLORIDE 0.9% FLUSH
10.0000 mL | INTRAVENOUS | Status: DC | PRN
  Administered 2018-09-27: 10 mL
  Filled 2018-09-27: qty 10

## 2018-09-27 NOTE — Telephone Encounter (Signed)
Printed avs and calender of upcoming appointment. Per 10/18 los. Canceled all (infusion) treatments, labs, port that was scheduled Magrinat 2nd week of January with lab, and port.

## 2018-10-01 ENCOUNTER — Ambulatory Visit (HOSPITAL_COMMUNITY): Payer: BC Managed Care – PPO

## 2018-10-01 ENCOUNTER — Encounter: Payer: Self-pay | Admitting: Radiation Oncology

## 2018-10-01 ENCOUNTER — Ambulatory Visit (HOSPITAL_COMMUNITY): Payer: BC Managed Care – PPO | Admitting: Cardiology

## 2018-10-01 NOTE — Progress Notes (Signed)
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  MRI Breast 02/21/2018: 1. Biopsy-proven invasive ductal carcinoma within the upper-outer quadrant of the right breast, with associated biopsy clip, the combination of mass and surrounding/abutting non-mass enhancement measures 2.3 x 2 x 1.9 cm. 2. No evidence of multifocal or multicentric disease within the right breast.  3. No evidence of malignancy within the left breast.  4. No evidence of metastatic lymphadenopathy.  Histology per Pathology Report: Right Breast 02/07/2018    Receptor Status: ER(+ 100%), PR (+ 20%), Her2-neu (-), Ki-67 (10%)  Did patient present with symptoms (if so, please note symptoms) or was this found on screening mammography?: Mammogram done on 01/29/2018 noted possible mass associated with distortion and calcifications in the right breast.  Past/Anticipated interventions by surgeon, if any: IAGNO Diagnosis  03-18-18 Dr.DouglasBlackman 1. Breast, partial mastectomy, Right - INVASIVE AND IN SITU DUCTAL CARCINOMA, 1.5 CM, MSBR GRADE 2. - MARGINS NOT INVOLVED. - FIBROCYTIC CHANGES WITH CALCIFICATIONS. - PREVIOUS BIOPSY SITE AND BIOPSY CLIP. 2. Lymph node, sentinel, biopsy, Right axillary - ONE BENIGN LYMPH NODE (0/1). Microscopic Comment 1. BREAST, INVASIVE TUMOR Procedure: Right breast lumpectomy and one axillary sentinel lymph node. Laterality: Right breast. Receptor Status: ER(+ 100%), PR (+ 20%), Her2-neu (- ratio 1.17), Ki-67 (10%)  Past/Anticipated interventions by medical oncology, if any:  Chemotherapy  Adjuvant chemotherapy  06-04-18 day 9 cycle 1 of cyclophosphamide and doxorubicin x 4given every 14 days followed by weekly paclitaxel x12  She has received four cycles of  Doxoribucin and Cyclophosphamide, and is due to day to starting Paclitaxel 08-09-18  03-18-18   Mammaprint [place] Risk   Genetic Counselor on 03/05/2018 Negative result  Adjuvant radiation to follow  Antiestrogens to follow at the completion of local treatment  Lymphedema issues, if any: No ROM to right arm good Skin to right breast intact with normal color. Follow up appointment saw Dr. [name] two weeks after her surgery.  Pain issues, if any: 4/10 right rib cage not taking any medication  SAFETY ISSUES:  Prior radiation? No  Pacemaker/ICD? No  Possible current pregnancy? No  Is the patient on methotrexate? No  GYNECOLOGIC HISTORY:  The patient's periods currently are about twice a month.   Menarche: 52 years old. She used to be on a dance team.  Age at first live birth: 52 years old She is [place] P2. She was taking oral contraceptives, but this has been discontinued as of January 2019. She had bilateral tubal ligation at the time of her second c-section  Current Complaints / other details: Stopped taking birth control pills, 2 blood clots in her right wrist and elbow area.  Prescribed strong dose of aleve to resolve.  Swelling is down.  Blood clots resolved in right arm.    Wt Readings from Last 3 Encounters:  10/03/18 185 lb 12.8 oz (84.3 kg)  09/27/18 187 lb 3.2 oz (84.9 kg)  09/20/18 184 lb 11.2 oz (83.8 kg)   BP 108/72 (BP Location: Left Arm, Patient Position: Sitting)   Pulse 77   Temp 98.3 F (36.8 C) (Oral)   Resp 18   Ht _0  (1.626 m)   Wt 185 lb 12.8 oz (84.3 kg)   SpO2 99%   BMI 31.89 kg/m

## 2018-10-03 ENCOUNTER — Encounter: Payer: Self-pay | Admitting: Radiation Oncology

## 2018-10-03 ENCOUNTER — Ambulatory Visit
Admission: RE | Admit: 2018-10-03 | Discharge: 2018-10-03 | Disposition: A | Discharge status: Home / Self Care | Payer: BC Managed Care – PPO | Source: Ambulatory Visit | Attending: Radiation Oncology | Admitting: Radiation Oncology

## 2018-10-03 ENCOUNTER — Telehealth: Payer: Self-pay | Admitting: *Deleted

## 2018-10-03 ENCOUNTER — Other Ambulatory Visit: Payer: Self-pay

## 2018-10-03 VITALS — BP 108/72 | HR 77 | Temp 98.3°F | Resp 18 | Ht 64.0 in | Wt 185.8 lb

## 2018-10-03 DIAGNOSIS — Z888 Allergy status to other drugs, medicaments and biological substances status: Secondary | ICD-10-CM | POA: Diagnosis not present

## 2018-10-03 DIAGNOSIS — C50411 Malignant neoplasm of upper-outer quadrant of right female breast: Secondary | ICD-10-CM

## 2018-10-03 DIAGNOSIS — Z170 Estrogen receptor positive status [ER+]: Secondary | ICD-10-CM | POA: Diagnosis not present

## 2018-10-03 NOTE — Progress Notes (Signed)
Radiation Oncology         ([phone]) [phone] ________________________________  Name: Anna Conrad        MRN: [id number]  Date of Service: 10/03/2018 DOB: 07-04-1966  ES:PQZRAQT, Earlie Server, PA-C  Magrinat, Virgie Dad, MD     REFERRING PHYSICIAN: Magrinat, Virgie Dad, MD   DIAGNOSIS: The encounter diagnosis was Malignant neoplasm of upper-outer quadrant of right breast in female, estrogen receptor positive ([place]).   HISTORY OF PRESENT ILLNESS: Anna Conrad is a 52 y.o. female seen at the request of Dr. Ninfa Linden for new diagnosis of right breast cancer. The patient was found to have a mass in the right breast on screening mammogram in February 2019. Diagnostic ultrasound on 02/05/18 revealed a 1.3 x 1.4 x 1.5 cm at 10:30. The axilla was negative for adenopathy. She underwent biopsy on 02/07/18 revealing a grade 2 invasive ductal carcinoma with DCIS, ER/PR positive, HER2 negative. She also underwent an MRI of bilateral breasts revealing a 1.5 x 1.3 x 1.3 cm mass in the upper outer quadrant of the right breast, as well as non mass enhancement abutting the dominant mass but extending 2.3 x 2 x 1.9 cm. No adenopathy was noted and no masses or enhancement was seen in the left breast. She underwent right lumpectomy on 03/18/2018 which revealed a 1.5 cm grade 2 invasive ductal carcinoma with DCIS, her margins were negative and her single node that was sampled was negative as well.  She had a MammaPrint test that was performed which revealed high risk features, and she was counseled on the rationale for chemotherapy which she began on 06/04/2018 and completed on 07/28/2018, she continued on with weekly Taxol which completed 6 cycles of on 09/13/2018.  This was discontinued early due to progressive neuropathy.  She comes today to discuss the rationale for adjuvant radiotherapy.  PREVIOUS RADIATION THERAPY: No   PAST MEDICAL HISTORY:  Past Medical History:  Diagnosis Date  . Blood dyscrasia    hx blood  clott superficial in rt arm  . Cancer [facility]) 2019   right breast cancer  . Family history of breast cancer   . Heart murmur   . Hypertension        PAST SURGICAL HISTORY: Past Surgical History:  Procedure Laterality Date  . BREAST LUMPECTOMY WITH RADIOACTIVE SEED AND SENTINEL LYMPH NODE BIOPSY Right 03/18/2018   Procedure: RIGHT BREAST PARTIAL MASTECTOMY WITH RADIOACTIVE SEED AND SENTINEL LYMPH NODE BIOPSY;  Surgeon: [name], MD;  Location: [place];  Service: General;  Laterality: Right;  . cesearean section     tubal ligation   . IR CV LINE INJECTION  08/28/2018  . IR IMAGING GUIDED PORT INSERTION  09/04/2018  . IR REMOVAL TUN ACCESS W/ PORT W/O FL MOD SED  09/04/2018  . PORTACATH PLACEMENT Left 05/27/2018   Procedure: INSERTION PORT-A-CATH;  Surgeon: [name], MD;  Location: [place];  Service: General;  Laterality: Left;  . TUBAL LIGATION       FAMILY HISTORY:  Family History  Problem Relation Age of Onset  . Breast cancer Sister 17       approximate  . Cancer Father 10       started in shoulder, spread to spine, brain, lung  . Cancer Paternal [name]        'blood cancer'  . Heart attack Paternal Uncle   . Breast cancer Cousin        age dx unk  . Breast cancer Cousin  age dx unk  . Breast cancer Cousin        age dx unk     SOCIAL HISTORY:  reports that she has never smoked. She has never used smokeless tobacco. She reports that she drinks alcohol. She reports that she does not use drugs. The patient is married and lives in [place]. She has  71 and 50 year old boys. She is a Pharmacist, hospital at [employer].  ALLERGIES: Adhesive [tape] and Latex   MEDICATIONS:  Current Outpatient Medications  Medication Sig Dispense Refill  . lisinopril-hydrochlorothiazide (PRINZIDE,ZESTORETIC) 10-12.5 MG tablet Take 1 tablet by mouth daily.    Marland Kitchen acetaminophen (TYLENOL) 500 MG tablet Take 1,000 mg by mouth every 6 (six) hours as needed (for pain.).     Marland Kitchen fluconazole (DIFLUCAN) 100 MG tablet Take 1 tablet (100 mg total) by mouth daily. (Patient not taking: Reported on 10/03/2018) 30 tablet 0  . ibuprofen (ADVIL,MOTRIN) 200 MG tablet Take 400 mg by mouth every 8 (eight) hours as needed (for pain.).    Marland Kitchen lidocaine-prilocaine (EMLA) cream Apply to affected area once (Patient not taking: Reported on 10/03/2018) 30 g 3  . LORazepam (ATIVAN) 0.5 MG tablet Take 1 tablet (0.5 mg total) by mouth at bedtime as needed (Nausea or vomiting). (Patient not taking: Reported on 10/03/2018) 30 tablet 0  . miconazole (MONISTAT 7) 2 % vaginal cream Place 1 Applicatorful vaginally at bedtime. (Patient not taking: Reported on 10/03/2018) 45 g 0  . prochlorperazine (COMPAZINE) 10 MG tablet Take 1 tablet (10 mg total) by mouth every 6 (six) hours as needed (Nausea or vomiting). (Patient not taking: Reported on 10/03/2018) 30 tablet 1  . triamcinolone (KENALOG) 0.025 % ointment Apply 1 application topically 2 (two) times daily. (Patient not taking: Reported on 10/03/2018) 30 g 0   No current facility-administered medications for this encounter.      REVIEW OF SYSTEMS: On review of systems, the patient reports that she is doing well overall.  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  She denies any chest pain, shortness of breath, cough, fevers, chills, night sweats, unintended weight changes. She denies any bowel or bladder disturbances, and denies abdominal pain, nausea or vomiting. She denies any new musculoskeletal or joint aches or pains. A complete review of systems is obtained and is otherwise negative.     PHYSICAL EXAM:  Wt Readings from Last 3 Encounters:  10/03/18 185 lb 12.8 oz (84.3 kg)  09/27/18 187 lb 3.2 oz (84.9 kg)  09/20/18 184 lb 11.2 oz (83.8 kg)   Temp Readings from Last 3 Encounters:  10/03/18 98.3 F (36.8 C) (Oral)  09/27/18 98.2 F (36.8 C) (Oral)  09/20/18 98.7 F (37.1 C) (Oral)   BP  Readings from Last 3 Encounters:  10/03/18 108/72  09/27/18 103/75  09/20/18 104/74   Pulse Readings from Last 3 Encounters:  10/03/18 77  09/27/18 84  09/20/18 85   Pain Assessment Pain Score: 3  Pain Frequency: Constant Pain Loc: Rib Cage/10  In general this is a well appearing caucasian female in no acute distress. She is alert and oriented x4 and appropriate throughout the examination. HEENT reveals that the patient is normocephalic, atraumatic. EOMs are intact. PERRLA. Skin is intact without any evidence of gross lesions. Cardiopulmonary assessment is negative for acute distress and she exhibits normal effort.  The right breast is assessed and a well-healed lumpectomy site is appreciated.  No evidence of  erythema is noted.    ECOG = 0  0 - Asymptomatic (Fully active, able to carry on all predisease activities without restriction)  1 - Symptomatic but completely ambulatory (Restricted in physically strenuous activity but ambulatory and able to carry out work of a light or sedentary nature. For example, light housework, office work)  2 - Symptomatic, <50% in bed during the day (Ambulatory and capable of all self care but unable to carry out any work activities. Up and about more than 50% of waking hours)  3 - Symptomatic, >50% in bed, but not bedbound (Capable of only limited self-care, confined to bed or chair 50% or more of waking hours)  4 - Bedbound (Completely disabled. Cannot carry on any self-care. Totally confined to bed or chair)  5 - Death   Eustace Pen MM, Creech RH, Tormey DC, et al. [phone]). "Toxicity and response criteria of the [facility]". [place] Oncol. 5 (6): 649-55    LABORATORY DATA:  Lab Results  Component Value Date   WBC 4.9 09/27/2018   HGB 10.8 (L) 09/27/2018   HCT 32.2 (L) 09/27/2018   MCV 101.6 (H) 09/27/2018   PLT 280 09/27/2018   Lab Results  Component Value Date   NA 141 09/27/2018   K 3.7 09/27/2018   CL 104  09/27/2018   CO2 29 09/27/2018   Lab Results  Component Value Date   ALT 21 09/27/2018   AST 22 09/27/2018   ALKPHOS 52 09/27/2018   BILITOT 0.6 09/27/2018      RADIOGRAPHY: Ir Removal [employer] W/ [place] W/o Fl Mod Sed  Result Date: 09/04/2018 CLINICAL DATA:  History of breast carcinoma. Left subclavian Port-A-Cath placed surgically on 05/27/2018 is now malpositioned with catheter tip oriented transversely in the brachiocephalic vein and along the lateral wall of the upper SVC. New port placement required for further chemotherapy. EXAM: 1. IMPLANTED PORT A CATH PLACEMENT WITH ULTRASOUND AND FLUOROSCOPIC GUIDANCE 2. REMOVAL OF IMPLANTED LEFT CHEST TUNNELED CENTRAL VENOUS PORT-A-CATH ANESTHESIA/SEDATION: 4.0 mg IV Versed; 100 mcg IV Fentanyl Total Moderate Sedation Time:  68 minutes The patient's level of consciousness and physiologic status were continuously monitored during the procedure by Radiology nursing. Additional Medications: 2 g IV Ancef. FLUOROSCOPY TIME:  24 seconds.  5.0 mGy. PROCEDURE: The procedure, risks, benefits, and alternatives were explained to the patient. Questions regarding the procedure were encouraged and answered. The patient understands and consents to the procedure. A time-out was performed prior to initiating the procedure. Ultrasound was utilized to confirm patency of the right internal jugular vein. The right neck and chest were prepped with chlorhexidine in a sterile fashion, and a sterile drape was applied covering the operative field. Maximum barrier sterile technique with sterile gowns and gloves were used for the procedure. Local anesthesia was provided with 1% lidocaine. After creating a small venotomy incision, a 21 gauge needle was advanced into the right internal jugular vein under direct, real-time ultrasound guidance. Ultrasound image documentation was performed. After securing guidewire access, an 8 Fr dilator was placed. A J-wire was kinked to measure  appropriate catheter length. A subcutaneous port pocket was then created along the upper chest wall utilizing sharp and blunt dissection. Portable cautery was utilized. The pocket was irrigated with sterile saline. A single lumen power injectable port was chosen for placement. The 8 Fr catheter was tunneled from the port pocket site to the venotomy incision. The port was placed in the pocket. External catheter was trimmed to appropriate  length based on guidewire measurement. At the venotomy, an 8 Fr peel-away sheath was placed over a guidewire. The catheter was then placed through the sheath and the sheath removed. Final catheter positioning was confirmed and documented with a fluoroscopic spot image. The port was accessed with a needle and aspirated and flushed with heparinized saline. The access needle was removed. The venotomy and port pocket incisions were closed with subcutaneous 3-0 Monocryl and subcuticular 4-0 Vicryl. Dermabond was applied to both incisions. The skin overlying the left chest port reservoir was then prepped with chlorhexidine. Local anesthesia was provided with 1% lidocaine. A skin incision was made with a #15 scalpel. Utilizing sharp and blunt dissection, the indwelling left chest subclavian Port-A-Cath was removed. Portable cautery was utilized. The pocket was irrigated with sterile saline. The incision was closed with subcutaneous 3-0 Monocryl and subcuticular 4-0 Vicryl. Dermabond was applied to the incision. COMPLICATIONS: COMPLICATIONS None FINDINGS: After new port placement via the right internal jugular vein, the tip lies at the cavo-atrial junction. The catheter aspirates normally and is ready for immediate use. The left chest port was removed in its entirety without difficulty. IMPRESSION: 1. Placement of single lumen port a cath via right internal jugular vein. The catheter tip lies at the cavo-atrial junction. A power injectable port a cath was placed and is ready for immediate  use. 2. Removal of malpositioned left chest port utilizing sharp and blunt dissection. Electronically Signed   By: Aletta Edouard M.D.   On: 09/04/2018 16:42   Ir Imaging Guided Port Insertion  Result Date: 09/04/2018 CLINICAL DATA:  History of breast carcinoma. Left subclavian Port-A-Cath placed surgically on 05/27/2018 is now malpositioned with catheter tip oriented transversely in the brachiocephalic vein and along the lateral wall of the upper SVC. New port placement required for further chemotherapy. EXAM: 1. IMPLANTED PORT A CATH PLACEMENT WITH ULTRASOUND AND FLUOROSCOPIC GUIDANCE 2. REMOVAL OF IMPLANTED LEFT CHEST TUNNELED CENTRAL VENOUS PORT-A-CATH ANESTHESIA/SEDATION: 4.0 mg IV Versed; 100 mcg IV Fentanyl Total Moderate Sedation Time:  68 minutes The patient's level of consciousness and physiologic status were continuously monitored during the procedure by Radiology nursing. Additional Medications: 2 g IV Ancef. FLUOROSCOPY TIME:  24 seconds.  5.0 mGy. PROCEDURE: The procedure, risks, benefits, and alternatives were explained to the patient. Questions regarding the procedure were encouraged and answered. The patient understands and consents to the procedure. A time-out was performed prior to initiating the procedure. Ultrasound was utilized to confirm patency of the right internal jugular vein. The right neck and chest were prepped with chlorhexidine in a sterile fashion, and a sterile drape was applied covering the operative field. Maximum barrier sterile technique with sterile gowns and gloves were used for the procedure. Local anesthesia was provided with 1% lidocaine. After creating a small venotomy incision, a 21 gauge needle was advanced into the right internal jugular vein under direct, real-time ultrasound guidance. Ultrasound image documentation was performed. After securing guidewire access, an 8 Fr dilator was placed. A J-wire was kinked to measure appropriate catheter length. A subcutaneous  port pocket was then created along the upper chest wall utilizing sharp and blunt dissection. Portable cautery was utilized. The pocket was irrigated with sterile saline. A single lumen power injectable port was chosen for placement. The 8 Fr catheter was tunneled from the port pocket site to the venotomy incision. The port was placed in the pocket. External catheter was trimmed to appropriate length based on guidewire measurement. At the venotomy, an 8 Fr peel-away  sheath was placed over a guidewire. The catheter was then placed through the sheath and the sheath removed. Final catheter positioning was confirmed and documented with a fluoroscopic spot image. The port was accessed with a needle and aspirated and flushed with heparinized saline. The access needle was removed. The venotomy and port pocket incisions were closed with subcutaneous 3-0 Monocryl and subcuticular 4-0 Vicryl. Dermabond was applied to both incisions. The skin overlying the left chest port reservoir was then prepped with chlorhexidine. Local anesthesia was provided with 1% lidocaine. A skin incision was made with a #15 scalpel. Utilizing sharp and blunt dissection, the indwelling left chest subclavian Port-A-Cath was removed. Portable cautery was utilized. The pocket was irrigated with sterile saline. The incision was closed with subcutaneous 3-0 Monocryl and subcuticular 4-0 Vicryl. Dermabond was applied to the incision. COMPLICATIONS: COMPLICATIONS None FINDINGS: After new port placement via the right internal jugular vein, the tip lies at the cavo-atrial junction. The catheter aspirates normally and is ready for immediate use. The left chest port was removed in its entirety without difficulty. IMPRESSION: 1. Placement of single lumen port a cath via right internal jugular vein. The catheter tip lies at the cavo-atrial junction. A power injectable port a cath was placed and is ready for immediate use. 2. Removal of malpositioned left chest  port utilizing sharp and blunt dissection. Electronically Signed   By: Aletta Edouard M.D.   On: 09/04/2018 16:42       IMPRESSION/PLAN: 1. 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 Dr. Lisbeth Renshaw discusses the final results from her pathology as well as her course since completing chemotherapy.  He recommends adjuvant treatment and would offer the patient a course of 6-1/2 weeks of daily therapy to the breast.  We discussed the risks, benefits, short, and long term effects of radiotherapy, and the patient is interested in proceeding if she proceeds with lumpectomy. Written consent is obtained and placed in the chart, a copy was provided to the patient.  She will return on Wednesday next week to begin her simulation process.  2. Contraceptive discussion.  The patient has had symptoms of menopause since being on chemotherapy, she has not had right turn of her menstrual cycle, she reports that she has had her tubes tied, and we discussed that we could move forward without any additional pregnancy testing as a result of her permanent sterilization.   In a visit lasting 25 minutes, greater than 50% of the time was spent face to face discussing her case, and coordinating the patient's care.      Carola Rhine, PAC

## 2018-10-03 NOTE — Progress Notes (Deleted)
Radiation Oncology         [phone]) [phone] ________________________________  Name: Anna Conrad        MRN: [id number]  Date of Service: 10/03/2018 DOB: 07-04-1966  ES:PQZRAQT, Earlie Server, PA-C  Magrinat, Virgie Dad, MD     REFERRING PHYSICIAN: Magrinat, Virgie Dad, MD   DIAGNOSIS: The encounter diagnosis was Malignant neoplasm of upper-outer quadrant of right breast in female, estrogen receptor positive ([place]).   HISTORY OF PRESENT ILLNESS: Anna Conrad is a 52 y.o. female seen at the request of Dr. Ninfa Linden for new diagnosis of right breast cancer. The patient was found to have a mass in the right breast on screening mammogram in February 2019. Diagnostic ultrasound on 02/05/18 revealed a 1.3 x 1.4 x 1.5 cm at 10:30. The axilla was negative for adenopathy. She underwent biopsy on 02/07/18 revealing a grade 2 invasive ductal carcinoma with DCIS, ER/PR positive, HER2 negative. She also underwent an MRI of bilateral breasts revealing a 1.5 x 1.3 x 1.3 cm mass in the upper outer quadrant of the right breast, as well as non mass enhancement abutting the dominant mass but extending 2.3 x 2 x 1.9 cm. No adenopathy was noted and no masses or enhancement was seen in the left breast. She underwent right lumpectomy on 03/18/2018 which revealed a 1.5 cm grade 2 invasive ductal carcinoma with DCIS, her margins were negative and her single node that was sampled was negative as well.  She had a MammaPrint test that was performed which revealed high risk features, and she was counseled on the rationale for chemotherapy which she began on 06/04/2018 and completed on 07/28/2018, she continued on with weekly Taxol which completed 6 cycles of on 09/13/2018.  This was discontinued early due to progressive neuropathy.  She comes today to discuss the rationale for adjuvant radiotherapy.  PREVIOUS RADIATION THERAPY: No   PAST MEDICAL HISTORY:  Past Medical History:  Diagnosis Date  . Blood dyscrasia    hx blood  clott superficial in rt arm  . Cancer [facility]) 2019   right breast cancer  . Family history of breast cancer   . Heart murmur   . Hypertension        PAST SURGICAL HISTORY: Past Surgical History:  Procedure Laterality Date  . BREAST LUMPECTOMY WITH RADIOACTIVE SEED AND SENTINEL LYMPH NODE BIOPSY Right 03/18/2018   Procedure: RIGHT BREAST PARTIAL MASTECTOMY WITH RADIOACTIVE SEED AND SENTINEL LYMPH NODE BIOPSY;  Surgeon: [name], MD;  Location: [place];  Service: General;  Laterality: Right;  . cesearean section     tubal ligation   . IR CV LINE INJECTION  08/28/2018  . IR IMAGING GUIDED PORT INSERTION  09/04/2018  . IR REMOVAL TUN ACCESS W/ PORT W/O FL MOD SED  09/04/2018  . PORTACATH PLACEMENT Left 05/27/2018   Procedure: INSERTION PORT-A-CATH;  Surgeon: [name], MD;  Location: [place];  Service: General;  Laterality: Left;  . TUBAL LIGATION       FAMILY HISTORY:  Family History  Problem Relation Age of Onset  . Breast cancer Sister 17       approximate  . Cancer Father 10       started in shoulder, spread to spine, brain, lung  . Cancer Paternal [name]        'blood cancer'  . Heart attack Paternal Uncle   . Breast cancer Cousin        age dx unk  . Breast cancer Cousin  age dx unk  . Breast cancer Cousin        age dx unk     SOCIAL HISTORY:  reports that she has never smoked. She has never used smokeless tobacco. She reports that she drinks alcohol. She reports that she does not use drugs. The patient is married and lives in [place]. She has  71 and 50 year old boys. She is a Pharmacist, hospital at [employer].  ALLERGIES: Adhesive [tape] and Latex   MEDICATIONS:  Current Outpatient Medications  Medication Sig Dispense Refill  . lisinopril-hydrochlorothiazide (PRINZIDE,ZESTORETIC) 10-12.5 MG tablet Take 1 tablet by mouth daily.    Marland Kitchen acetaminophen (TYLENOL) 500 MG tablet Take 1,000 mg by mouth every 6 (six) hours as needed (for pain.).     Marland Kitchen fluconazole (DIFLUCAN) 100 MG tablet Take 1 tablet (100 mg total) by mouth daily. (Patient not taking: Reported on 10/03/2018) 30 tablet 0  . ibuprofen (ADVIL,MOTRIN) 200 MG tablet Take 400 mg by mouth every 8 (eight) hours as needed (for pain.).    Marland Kitchen lidocaine-prilocaine (EMLA) cream Apply to affected area once (Patient not taking: Reported on 10/03/2018) 30 g 3  . LORazepam (ATIVAN) 0.5 MG tablet Take 1 tablet (0.5 mg total) by mouth at bedtime as needed (Nausea or vomiting). (Patient not taking: Reported on 10/03/2018) 30 tablet 0  . miconazole (MONISTAT 7) 2 % vaginal cream Place 1 Applicatorful vaginally at bedtime. (Patient not taking: Reported on 10/03/2018) 45 g 0  . prochlorperazine (COMPAZINE) 10 MG tablet Take 1 tablet (10 mg total) by mouth every 6 (six) hours as needed (Nausea or vomiting). (Patient not taking: Reported on 10/03/2018) 30 tablet 1  . triamcinolone (KENALOG) 0.025 % ointment Apply 1 application topically 2 (two) times daily. (Patient not taking: Reported on 10/03/2018) 30 g 0   No current facility-administered medications for this encounter.      REVIEW OF SYSTEMS: On review of systems, the patient reports that she is doing well overall.  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  She denies any chest pain, shortness of breath, cough, fevers, chills, night sweats, unintended weight changes. She denies any bowel or bladder disturbances, and denies abdominal pain, nausea or vomiting. She denies any new musculoskeletal or joint aches or pains. A complete review of systems is obtained and is otherwise negative.     PHYSICAL EXAM:  Wt Readings from Last 3 Encounters:  10/03/18 185 lb 12.8 oz (84.3 kg)  09/27/18 187 lb 3.2 oz (84.9 kg)  09/20/18 184 lb 11.2 oz (83.8 kg)   Temp Readings from Last 3 Encounters:  10/03/18 98.3 F (36.8 C) (Oral)  09/27/18 98.2 F (36.8 C) (Oral)  09/20/18 98.7 F (37.1 C) (Oral)   BP  Readings from Last 3 Encounters:  10/03/18 108/72  09/27/18 103/75  09/20/18 104/74   Pulse Readings from Last 3 Encounters:  10/03/18 77  09/27/18 84  09/20/18 85   Pain Assessment Pain Score: 3  Pain Frequency: Constant Pain Loc: Rib Cage/10  In general this is a well appearing caucasian female in no acute distress. She is alert and oriented x4 and appropriate throughout the examination. HEENT reveals that the patient is normocephalic, atraumatic. EOMs are intact. PERRLA. Skin is intact without any evidence of gross lesions. Cardiopulmonary assessment is negative for acute distress and she exhibits normal effort.  The right breast is assessed and a well-healed lumpectomy site is appreciated.  No evidence of  erythema is noted.    ECOG = 0  0 - Asymptomatic (Fully active, able to carry on all predisease activities without restriction)  1 - Symptomatic but completely ambulatory (Restricted in physically strenuous activity but ambulatory and able to carry out work of a light or sedentary nature. For example, light housework, office work)  2 - Symptomatic, <50% in bed during the day (Ambulatory and capable of all self care but unable to carry out any work activities. Up and about more than 50% of waking hours)  3 - Symptomatic, >50% in bed, but not bedbound (Capable of only limited self-care, confined to bed or chair 50% or more of waking hours)  4 - Bedbound (Completely disabled. Cannot carry on any self-care. Totally confined to bed or chair)  5 - Death   Eustace Pen MM, Creech RH, Tormey DC, et al. [phone]). "Toxicity and response criteria of the [facility]". [place] Oncol. 5 (6): 649-55    LABORATORY DATA:  Lab Results  Component Value Date   WBC 4.9 09/27/2018   HGB 10.8 (L) 09/27/2018   HCT 32.2 (L) 09/27/2018   MCV 101.6 (H) 09/27/2018   PLT 280 09/27/2018   Lab Results  Component Value Date   NA 141 09/27/2018   K 3.7 09/27/2018   CL 104  09/27/2018   CO2 29 09/27/2018   Lab Results  Component Value Date   ALT 21 09/27/2018   AST 22 09/27/2018   ALKPHOS 52 09/27/2018   BILITOT 0.6 09/27/2018      RADIOGRAPHY: Ir Removal [employer] W/ [place] W/o Fl Mod Sed  Result Date: 09/04/2018 CLINICAL DATA:  History of breast carcinoma. Left subclavian Port-A-Cath placed surgically on 05/27/2018 is now malpositioned with catheter tip oriented transversely in the brachiocephalic vein and along the lateral wall of the upper SVC. New port placement required for further chemotherapy. EXAM: 1. IMPLANTED PORT A CATH PLACEMENT WITH ULTRASOUND AND FLUOROSCOPIC GUIDANCE 2. REMOVAL OF IMPLANTED LEFT CHEST TUNNELED CENTRAL VENOUS PORT-A-CATH ANESTHESIA/SEDATION: 4.0 mg IV Versed; 100 mcg IV Fentanyl Total Moderate Sedation Time:  68 minutes The patient's level of consciousness and physiologic status were continuously monitored during the procedure by Radiology nursing. Additional Medications: 2 g IV Ancef. FLUOROSCOPY TIME:  24 seconds.  5.0 mGy. PROCEDURE: The procedure, risks, benefits, and alternatives were explained to the patient. Questions regarding the procedure were encouraged and answered. The patient understands and consents to the procedure. A time-out was performed prior to initiating the procedure. Ultrasound was utilized to confirm patency of the right internal jugular vein. The right neck and chest were prepped with chlorhexidine in a sterile fashion, and a sterile drape was applied covering the operative field. Maximum barrier sterile technique with sterile gowns and gloves were used for the procedure. Local anesthesia was provided with 1% lidocaine. After creating a small venotomy incision, a 21 gauge needle was advanced into the right internal jugular vein under direct, real-time ultrasound guidance. Ultrasound image documentation was performed. After securing guidewire access, an 8 Fr dilator was placed. A J-wire was kinked to measure  appropriate catheter length. A subcutaneous port pocket was then created along the upper chest wall utilizing sharp and blunt dissection. Portable cautery was utilized. The pocket was irrigated with sterile saline. A single lumen power injectable port was chosen for placement. The 8 Fr catheter was tunneled from the port pocket site to the venotomy incision. The port was placed in the pocket. External catheter was trimmed to appropriate  length based on guidewire measurement. At the venotomy, an 8 Fr peel-away sheath was placed over a guidewire. The catheter was then placed through the sheath and the sheath removed. Final catheter positioning was confirmed and documented with a fluoroscopic spot image. The port was accessed with a needle and aspirated and flushed with heparinized saline. The access needle was removed. The venotomy and port pocket incisions were closed with subcutaneous 3-0 Monocryl and subcuticular 4-0 Vicryl. Dermabond was applied to both incisions. The skin overlying the left chest port reservoir was then prepped with chlorhexidine. Local anesthesia was provided with 1% lidocaine. A skin incision was made with a #15 scalpel. Utilizing sharp and blunt dissection, the indwelling left chest subclavian Port-A-Cath was removed. Portable cautery was utilized. The pocket was irrigated with sterile saline. The incision was closed with subcutaneous 3-0 Monocryl and subcuticular 4-0 Vicryl. Dermabond was applied to the incision. COMPLICATIONS: COMPLICATIONS None FINDINGS: After new port placement via the right internal jugular vein, the tip lies at the cavo-atrial junction. The catheter aspirates normally and is ready for immediate use. The left chest port was removed in its entirety without difficulty. IMPRESSION: 1. Placement of single lumen port a cath via right internal jugular vein. The catheter tip lies at the cavo-atrial junction. A power injectable port a cath was placed and is ready for immediate  use. 2. Removal of malpositioned left chest port utilizing sharp and blunt dissection. Electronically Signed   By: Aletta Edouard M.D.   On: 09/04/2018 16:42   Ir Imaging Guided Port Insertion  Result Date: 09/04/2018 CLINICAL DATA:  History of breast carcinoma. Left subclavian Port-A-Cath placed surgically on 05/27/2018 is now malpositioned with catheter tip oriented transversely in the brachiocephalic vein and along the lateral wall of the upper SVC. New port placement required for further chemotherapy. EXAM: 1. IMPLANTED PORT A CATH PLACEMENT WITH ULTRASOUND AND FLUOROSCOPIC GUIDANCE 2. REMOVAL OF IMPLANTED LEFT CHEST TUNNELED CENTRAL VENOUS PORT-A-CATH ANESTHESIA/SEDATION: 4.0 mg IV Versed; 100 mcg IV Fentanyl Total Moderate Sedation Time:  68 minutes The patient's level of consciousness and physiologic status were continuously monitored during the procedure by Radiology nursing. Additional Medications: 2 g IV Ancef. FLUOROSCOPY TIME:  24 seconds.  5.0 mGy. PROCEDURE: The procedure, risks, benefits, and alternatives were explained to the patient. Questions regarding the procedure were encouraged and answered. The patient understands and consents to the procedure. A time-out was performed prior to initiating the procedure. Ultrasound was utilized to confirm patency of the right internal jugular vein. The right neck and chest were prepped with chlorhexidine in a sterile fashion, and a sterile drape was applied covering the operative field. Maximum barrier sterile technique with sterile gowns and gloves were used for the procedure. Local anesthesia was provided with 1% lidocaine. After creating a small venotomy incision, a 21 gauge needle was advanced into the right internal jugular vein under direct, real-time ultrasound guidance. Ultrasound image documentation was performed. After securing guidewire access, an 8 Fr dilator was placed. A J-wire was kinked to measure appropriate catheter length. A subcutaneous  port pocket was then created along the upper chest wall utilizing sharp and blunt dissection. Portable cautery was utilized. The pocket was irrigated with sterile saline. A single lumen power injectable port was chosen for placement. The 8 Fr catheter was tunneled from the port pocket site to the venotomy incision. The port was placed in the pocket. External catheter was trimmed to appropriate length based on guidewire measurement. At the venotomy, an 8 Fr peel-away  sheath was placed over a guidewire. The catheter was then placed through the sheath and the sheath removed. Final catheter positioning was confirmed and documented with a fluoroscopic spot image. The port was accessed with a needle and aspirated and flushed with heparinized saline. The access needle was removed. The venotomy and port pocket incisions were closed with subcutaneous 3-0 Monocryl and subcuticular 4-0 Vicryl. Dermabond was applied to both incisions. The skin overlying the left chest port reservoir was then prepped with chlorhexidine. Local anesthesia was provided with 1% lidocaine. A skin incision was made with a #15 scalpel. Utilizing sharp and blunt dissection, the indwelling left chest subclavian Port-A-Cath was removed. Portable cautery was utilized. The pocket was irrigated with sterile saline. The incision was closed with subcutaneous 3-0 Monocryl and subcuticular 4-0 Vicryl. Dermabond was applied to the incision. COMPLICATIONS: COMPLICATIONS None FINDINGS: After new port placement via the right internal jugular vein, the tip lies at the cavo-atrial junction. The catheter aspirates normally and is ready for immediate use. The left chest port was removed in its entirety without difficulty. IMPRESSION: 1. Placement of single lumen port a cath via right internal jugular vein. The catheter tip lies at the cavo-atrial junction. A power injectable port a cath was placed and is ready for immediate use. 2. Removal of malpositioned left chest  port utilizing sharp and blunt dissection. Electronically Signed   By: Aletta Edouard M.D.   On: 09/04/2018 16:42       IMPRESSION/PLAN: 1. 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 Dr. Lisbeth Renshaw discusses the final results from her pathology as well as her course since completing chemotherapy.  He recommends adjuvant treatment and would offer the patient a course of 6-1/2 weeks of daily therapy to the breast.  We discussed the risks, benefits, short, and long term effects of radiotherapy, and the patient is interested in proceeding if she proceeds with lumpectomy. Written consent is obtained and placed in the chart, a copy was provided to the patient.  She will return on Wednesday next week to begin her simulation process.  2. Contraceptive discussion.  The patient has had symptoms of menopause since being on chemotherapy, she has not had right turn of her menstrual cycle, she reports that she has had her tubes tied, and we discussed that we could move forward without any additional pregnancy testing as a result of her permanent sterilization.   In a visit lasting 25 minutes, greater than 50% of the time was spent face to face discussing her case, and coordinating the patient's care.  The above documentation reflects my direct findings during this shared patient visit. Please see the separate note by Dr. Lisbeth Renshaw on this date for the remainder of the patient's plan of care.    Carola Rhine, PAC

## 2018-10-03 NOTE — Telephone Encounter (Signed)
Left the patient a message regarding her appointment today.  I let her know that it was not necessary for her to make a special trip back in to speak to Dr. Lisbeth Renshaw, he will talk with her when she comes in for her CT simulation on 10/09/2018 8 am.  Left a call back number in the event she has any further questions.  Will continue to follow as necessary.  Gloriajean Dell. Leonie Green, BSN

## 2018-10-03 NOTE — Addendum Note (Signed)
Encounter addended by: Malena Edman, RN on: 10/03/2018 12:27 PM  Actions taken: Charge Capture section accepted

## 2018-10-04 ENCOUNTER — Ambulatory Visit: Payer: BC Managed Care – PPO

## 2018-10-04 ENCOUNTER — Inpatient Hospital Stay: Payer: BC Managed Care – PPO

## 2018-10-04 ENCOUNTER — Other Ambulatory Visit: Payer: BC Managed Care – PPO

## 2018-10-09 ENCOUNTER — Ambulatory Visit
Admission: RE | Admit: 2018-10-09 | Discharge: 2018-10-09 | Disposition: A | Discharge status: Home / Self Care | Payer: BC Managed Care – PPO | Source: Ambulatory Visit | Attending: Radiation Oncology | Admitting: Radiation Oncology

## 2018-10-09 DIAGNOSIS — C50411 Malignant neoplasm of upper-outer quadrant of right female breast: Secondary | ICD-10-CM | POA: Diagnosis not present

## 2018-10-09 DIAGNOSIS — Z170 Estrogen receptor positive status [ER+]: Secondary | ICD-10-CM | POA: Diagnosis not present

## 2018-10-09 NOTE — Progress Notes (Signed)
  Radiation Oncology         [phone]) [phone] ________________________________  Name: Anna Conrad MRN: [id number]  Date: 10/09/2018  DOB: [birth year]  DIAGNOSIS:     ICD-10-CM   1. Malignant neoplasm of upper-outer quadrant of right breast in female, estrogen receptor positive ([place]) C50.411    Z17.0      SIMULATION AND TREATMENT PLANNING NOTE  The patient presented for simulation prior to beginning her course of radiation treatment for her diagnosis of right-sided breast cancer. The patient was placed in a supine position on a breast board. A customized vac-lock bag was constructed and this complex treatment device will be used on a daily basis during her treatment. In this fashion, a CT scan was obtained through the chest area and an isocenter was placed near the chest wall within the breast.  The patient will be planned to receive a course of radiation initially to a dose of 50.4 Gy. This will consist of a whole breast radiotherapy technique. To accomplish this, 2 customized blocks have been designed which will correspond to medial and lateral whole breast tangent fields. This treatment will be accomplished at 1.8 Gy per fraction. A forward planning technique will also be evaluated to determine if this approach improves the plan. It is anticipated that the patient will then receive a 10 Gy boost to the seroma cavity which has been contoured. This will be accomplished at 2 Gy per fraction.   This initial treatment will consist of a 3-D conformal technique. The seroma has been contoured as the primary target structure. Additionally, dose volume histograms of both this target as well as the lungs and heart will also be evaluated. Such an approach is necessary to ensure that the target area is adequately covered while the nearby critical  normal structures are adequately spared.  Plan:  The final anticipated total dose therefore will correspond to 60.4  Gy.    _______________________________   Jodelle Gross, MD, PhD

## 2018-10-09 NOTE — Progress Notes (Signed)
  Radiation Oncology         [phone]) [phone] ________________________________  Name: Anna Conrad MRN: [id number]  Date: 10/09/2018  DOB: 09-18-1966  Optical Surface Tracking Plan:  Since intensity modulated radiotherapy (IMRT) and 3D conformal radiation treatment methods are predicated on accurate and precise positioning for treatment, intrafraction motion monitoring is medically necessary to ensure accurate and safe treatment delivery.  The ability to quantify intrafraction motion without excessive ionizing radiation dose can only be performed with optical surface tracking. Accordingly, surface imaging offers the opportunity to obtain 3D measurements of patient position throughout IMRT and 3D treatments without excessive radiation exposure.  I am ordering optical surface tracking for this patient's upcoming course of radiotherapy. ________________________________  Kyung Rudd, MD 10/09/2018 4:20 PM    Reference:   Ursula Alert, J, et al. Surface imaging-based analysis of intrafraction motion for breast radiotherapy patients.Journal of [place], n. 6, nov. 2014. ISSN [id number].   Available at: <http://www.jacmp.org/index.php/jacmp/article/view/4957>.

## 2018-10-10 ENCOUNTER — Ambulatory Visit (HOSPITAL_COMMUNITY): Payer: BC Managed Care – PPO | Admitting: Cardiology

## 2018-10-10 ENCOUNTER — Other Ambulatory Visit (HOSPITAL_COMMUNITY): Payer: BC Managed Care – PPO

## 2018-10-11 ENCOUNTER — Ambulatory Visit: Payer: BC Managed Care – PPO | Admitting: Adult Health

## 2018-10-11 ENCOUNTER — Other Ambulatory Visit: Payer: BC Managed Care – PPO

## 2018-10-11 ENCOUNTER — Ambulatory Visit: Payer: BC Managed Care – PPO

## 2018-10-15 DIAGNOSIS — C50411 Malignant neoplasm of upper-outer quadrant of right female breast: Secondary | ICD-10-CM | POA: Insufficient documentation

## 2018-10-15 DIAGNOSIS — Z170 Estrogen receptor positive status [ER+]: Secondary | ICD-10-CM | POA: Diagnosis not present

## 2018-10-16 ENCOUNTER — Ambulatory Visit
Admission: RE | Admit: 2018-10-16 | Discharge: 2018-10-16 | Disposition: A | Discharge status: Home / Self Care | Payer: BC Managed Care – PPO | Source: Ambulatory Visit | Attending: Radiation Oncology | Admitting: Radiation Oncology

## 2018-10-16 DIAGNOSIS — C50411 Malignant neoplasm of upper-outer quadrant of right female breast: Secondary | ICD-10-CM | POA: Diagnosis not present

## 2018-10-17 ENCOUNTER — Ambulatory Visit
Admission: RE | Admit: 2018-10-17 | Discharge: 2018-10-17 | Disposition: A | Discharge status: Home / Self Care | Payer: BC Managed Care – PPO | Source: Ambulatory Visit | Attending: Radiation Oncology | Admitting: Radiation Oncology

## 2018-10-17 DIAGNOSIS — Z170 Estrogen receptor positive status [ER+]: Principal | ICD-10-CM

## 2018-10-17 DIAGNOSIS — C50411 Malignant neoplasm of upper-outer quadrant of right female breast: Secondary | ICD-10-CM

## 2018-10-17 MED ORDER — ALRA NON-METALLIC DEODORANT (RAD-ONC)
1.0000 "application " | Freq: Once | TOPICAL | Status: AC
  Administered 2018-10-17: 1 via TOPICAL

## 2018-10-17 MED ORDER — RADIAPLEXRX EX GEL
Freq: Once | CUTANEOUS | Status: AC
  Administered 2018-10-17: 15:00:00 via TOPICAL

## 2018-10-17 NOTE — Progress Notes (Signed)
Pt here for patient teaching.  Pt given Radiation and You booklet, skin care instructions, Alra deodorant and Radiaplex gel.  Reviewed areas of pertinence such as fatigue, hair loss, skin changes, breast tenderness and breast swelling . Pt able to give teach back of to pat skin and use unscented/gentle soap,apply Radiaplex bid, avoid applying anything to skin within 4 hours of treatment, avoid wearing an under wire bra and to use an electric razor if they must shave. Pt verbalizes understanding of information given and will contact nursing with any questions or concerns.     Chanson Teems M. Aliyha Fornes RN, BSN      

## 2018-10-18 ENCOUNTER — Ambulatory Visit: Payer: BC Managed Care – PPO

## 2018-10-18 ENCOUNTER — Other Ambulatory Visit: Payer: BC Managed Care – PPO

## 2018-10-18 ENCOUNTER — Ambulatory Visit
Admission: RE | Admit: 2018-10-18 | Discharge: 2018-10-18 | Disposition: A | Discharge status: Home / Self Care | Payer: BC Managed Care – PPO | Source: Ambulatory Visit | Attending: Radiation Oncology | Admitting: Radiation Oncology

## 2018-10-18 DIAGNOSIS — C50411 Malignant neoplasm of upper-outer quadrant of right female breast: Secondary | ICD-10-CM | POA: Diagnosis not present

## 2018-10-21 ENCOUNTER — Ambulatory Visit
Admission: RE | Admit: 2018-10-21 | Discharge: 2018-10-21 | Disposition: A | Discharge status: Home / Self Care | Payer: BC Managed Care – PPO | Source: Ambulatory Visit | Attending: Radiation Oncology | Admitting: Radiation Oncology

## 2018-10-21 DIAGNOSIS — C50411 Malignant neoplasm of upper-outer quadrant of right female breast: Secondary | ICD-10-CM | POA: Diagnosis not present

## 2018-10-22 ENCOUNTER — Ambulatory Visit
Admission: RE | Admit: 2018-10-22 | Discharge: 2018-10-22 | Disposition: A | Discharge status: Home / Self Care | Payer: BC Managed Care – PPO | Source: Ambulatory Visit | Attending: Radiation Oncology | Admitting: Radiation Oncology

## 2018-10-22 DIAGNOSIS — C50411 Malignant neoplasm of upper-outer quadrant of right female breast: Secondary | ICD-10-CM | POA: Diagnosis not present

## 2018-10-23 ENCOUNTER — Ambulatory Visit
Admission: RE | Admit: 2018-10-23 | Discharge: 2018-10-23 | Disposition: A | Discharge status: Home / Self Care | Payer: BC Managed Care – PPO | Source: Ambulatory Visit | Attending: Radiation Oncology | Admitting: Radiation Oncology

## 2018-10-23 DIAGNOSIS — C50411 Malignant neoplasm of upper-outer quadrant of right female breast: Secondary | ICD-10-CM | POA: Diagnosis not present

## 2018-10-24 ENCOUNTER — Ambulatory Visit
Admission: RE | Admit: 2018-10-24 | Discharge: 2018-10-24 | Disposition: A | Discharge status: Home / Self Care | Payer: BC Managed Care – PPO | Source: Ambulatory Visit | Attending: Radiation Oncology | Admitting: Radiation Oncology

## 2018-10-24 DIAGNOSIS — C50411 Malignant neoplasm of upper-outer quadrant of right female breast: Secondary | ICD-10-CM | POA: Diagnosis not present

## 2018-10-25 ENCOUNTER — Ambulatory Visit: Payer: BC Managed Care – PPO | Admitting: Adult Health

## 2018-10-25 ENCOUNTER — Other Ambulatory Visit: Payer: BC Managed Care – PPO

## 2018-10-25 ENCOUNTER — Ambulatory Visit: Payer: BC Managed Care – PPO

## 2018-10-25 ENCOUNTER — Ambulatory Visit
Admission: RE | Admit: 2018-10-25 | Discharge: 2018-10-25 | Disposition: A | Discharge status: Home / Self Care | Payer: BC Managed Care – PPO | Source: Ambulatory Visit | Attending: Radiation Oncology | Admitting: Radiation Oncology

## 2018-10-25 DIAGNOSIS — C50411 Malignant neoplasm of upper-outer quadrant of right female breast: Secondary | ICD-10-CM | POA: Diagnosis not present

## 2018-10-28 ENCOUNTER — Ambulatory Visit
Admission: RE | Admit: 2018-10-28 | Discharge: 2018-10-28 | Disposition: A | Discharge status: Home / Self Care | Payer: BC Managed Care – PPO | Source: Ambulatory Visit | Attending: Radiation Oncology | Admitting: Radiation Oncology

## 2018-10-28 DIAGNOSIS — C50411 Malignant neoplasm of upper-outer quadrant of right female breast: Secondary | ICD-10-CM | POA: Diagnosis not present

## 2018-10-29 ENCOUNTER — Ambulatory Visit
Admission: RE | Admit: 2018-10-29 | Discharge: 2018-10-29 | Disposition: A | Discharge status: Home / Self Care | Payer: BC Managed Care – PPO | Source: Ambulatory Visit | Attending: Radiation Oncology | Admitting: Radiation Oncology

## 2018-10-29 DIAGNOSIS — C50411 Malignant neoplasm of upper-outer quadrant of right female breast: Secondary | ICD-10-CM | POA: Diagnosis not present

## 2018-10-30 ENCOUNTER — Ambulatory Visit
Admission: RE | Admit: 2018-10-30 | Discharge: 2018-10-30 | Disposition: A | Discharge status: Home / Self Care | Payer: BC Managed Care – PPO | Source: Ambulatory Visit | Attending: Radiation Oncology | Admitting: Radiation Oncology

## 2018-10-30 DIAGNOSIS — C50411 Malignant neoplasm of upper-outer quadrant of right female breast: Secondary | ICD-10-CM | POA: Diagnosis not present

## 2018-10-31 ENCOUNTER — Ambulatory Visit
Admission: RE | Admit: 2018-10-31 | Discharge: 2018-10-31 | Disposition: A | Discharge status: Home / Self Care | Payer: BC Managed Care – PPO | Source: Ambulatory Visit | Attending: Radiation Oncology | Admitting: Radiation Oncology

## 2018-10-31 DIAGNOSIS — C50411 Malignant neoplasm of upper-outer quadrant of right female breast: Secondary | ICD-10-CM | POA: Diagnosis not present

## 2018-11-01 ENCOUNTER — Ambulatory Visit
Admission: RE | Admit: 2018-11-01 | Discharge: 2018-11-01 | Disposition: A | Discharge status: Home / Self Care | Payer: BC Managed Care – PPO | Source: Ambulatory Visit | Attending: Radiation Oncology | Admitting: Radiation Oncology

## 2018-11-01 DIAGNOSIS — C50411 Malignant neoplasm of upper-outer quadrant of right female breast: Secondary | ICD-10-CM | POA: Diagnosis not present

## 2018-11-04 ENCOUNTER — Ambulatory Visit
Admission: RE | Admit: 2018-11-04 | Discharge: 2018-11-04 | Disposition: A | Discharge status: Home / Self Care | Payer: BC Managed Care – PPO | Source: Ambulatory Visit | Attending: Radiation Oncology | Admitting: Radiation Oncology

## 2018-11-04 DIAGNOSIS — C50411 Malignant neoplasm of upper-outer quadrant of right female breast: Secondary | ICD-10-CM | POA: Diagnosis not present

## 2018-11-05 ENCOUNTER — Ambulatory Visit
Admission: RE | Admit: 2018-11-05 | Discharge: 2018-11-05 | Disposition: A | Discharge status: Home / Self Care | Payer: BC Managed Care – PPO | Source: Ambulatory Visit | Attending: Radiation Oncology | Admitting: Radiation Oncology

## 2018-11-05 DIAGNOSIS — C50411 Malignant neoplasm of upper-outer quadrant of right female breast: Secondary | ICD-10-CM | POA: Diagnosis not present

## 2018-11-06 ENCOUNTER — Ambulatory Visit
Admission: RE | Admit: 2018-11-06 | Discharge: 2018-11-06 | Disposition: A | Discharge status: Home / Self Care | Payer: BC Managed Care – PPO | Source: Ambulatory Visit | Attending: Radiation Oncology | Admitting: Radiation Oncology

## 2018-11-06 DIAGNOSIS — C50411 Malignant neoplasm of upper-outer quadrant of right female breast: Secondary | ICD-10-CM | POA: Diagnosis not present

## 2018-11-11 ENCOUNTER — Ambulatory Visit
Admission: RE | Admit: 2018-11-11 | Discharge: 2018-11-11 | Disposition: A | Discharge status: Home / Self Care | Payer: BC Managed Care – PPO | Source: Ambulatory Visit | Attending: Radiation Oncology | Admitting: Radiation Oncology

## 2018-11-11 DIAGNOSIS — Z170 Estrogen receptor positive status [ER+]: Secondary | ICD-10-CM | POA: Insufficient documentation

## 2018-11-11 DIAGNOSIS — C50411 Malignant neoplasm of upper-outer quadrant of right female breast: Secondary | ICD-10-CM | POA: Insufficient documentation

## 2018-11-12 ENCOUNTER — Ambulatory Visit
Admission: RE | Admit: 2018-11-12 | Discharge: 2018-11-12 | Disposition: A | Discharge status: Home / Self Care | Payer: BC Managed Care – PPO | Source: Ambulatory Visit | Attending: Radiation Oncology | Admitting: Radiation Oncology

## 2018-11-12 DIAGNOSIS — C50411 Malignant neoplasm of upper-outer quadrant of right female breast: Secondary | ICD-10-CM | POA: Diagnosis not present

## 2018-11-13 ENCOUNTER — Ambulatory Visit
Admission: RE | Admit: 2018-11-13 | Discharge: 2018-11-13 | Disposition: A | Discharge status: Home / Self Care | Payer: BC Managed Care – PPO | Source: Ambulatory Visit | Attending: Radiation Oncology | Admitting: Radiation Oncology

## 2018-11-13 DIAGNOSIS — C50411 Malignant neoplasm of upper-outer quadrant of right female breast: Secondary | ICD-10-CM | POA: Diagnosis not present

## 2018-11-14 ENCOUNTER — Ambulatory Visit
Admission: RE | Admit: 2018-11-14 | Discharge: 2018-11-14 | Disposition: A | Discharge status: Home / Self Care | Payer: BC Managed Care – PPO | Source: Ambulatory Visit | Attending: Radiation Oncology | Admitting: Radiation Oncology

## 2018-11-14 DIAGNOSIS — C50411 Malignant neoplasm of upper-outer quadrant of right female breast: Secondary | ICD-10-CM | POA: Diagnosis not present

## 2018-11-15 ENCOUNTER — Ambulatory Visit
Admission: RE | Admit: 2018-11-15 | Discharge: 2018-11-15 | Disposition: A | Discharge status: Home / Self Care | Payer: BC Managed Care – PPO | Source: Ambulatory Visit | Attending: Radiation Oncology | Admitting: Radiation Oncology

## 2018-11-15 DIAGNOSIS — C50411 Malignant neoplasm of upper-outer quadrant of right female breast: Secondary | ICD-10-CM | POA: Diagnosis not present

## 2018-11-18 ENCOUNTER — Ambulatory Visit
Admission: RE | Admit: 2018-11-18 | Discharge: 2018-11-18 | Disposition: A | Discharge status: Home / Self Care | Payer: BC Managed Care – PPO | Source: Ambulatory Visit | Attending: Radiation Oncology | Admitting: Radiation Oncology

## 2018-11-18 DIAGNOSIS — C50411 Malignant neoplasm of upper-outer quadrant of right female breast: Secondary | ICD-10-CM | POA: Diagnosis not present

## 2018-11-19 ENCOUNTER — Ambulatory Visit
Admission: RE | Admit: 2018-11-19 | Discharge: 2018-11-19 | Disposition: A | Discharge status: Home / Self Care | Payer: BC Managed Care – PPO | Source: Ambulatory Visit | Attending: Radiation Oncology | Admitting: Radiation Oncology

## 2018-11-19 DIAGNOSIS — C50411 Malignant neoplasm of upper-outer quadrant of right female breast: Secondary | ICD-10-CM | POA: Diagnosis not present

## 2018-11-20 ENCOUNTER — Ambulatory Visit
Admission: RE | Admit: 2018-11-20 | Discharge: 2018-11-20 | Disposition: A | Discharge status: Home / Self Care | Payer: BC Managed Care – PPO | Source: Ambulatory Visit | Attending: Radiation Oncology | Admitting: Radiation Oncology

## 2018-11-20 DIAGNOSIS — C50411 Malignant neoplasm of upper-outer quadrant of right female breast: Secondary | ICD-10-CM | POA: Diagnosis not present

## 2018-11-21 ENCOUNTER — Ambulatory Visit
Admission: RE | Admit: 2018-11-21 | Discharge: 2018-11-21 | Disposition: A | Discharge status: Home / Self Care | Payer: BC Managed Care – PPO | Source: Ambulatory Visit | Attending: Radiation Oncology | Admitting: Radiation Oncology

## 2018-11-21 DIAGNOSIS — C50411 Malignant neoplasm of upper-outer quadrant of right female breast: Secondary | ICD-10-CM | POA: Diagnosis not present

## 2018-11-22 ENCOUNTER — Ambulatory Visit
Admission: RE | Admit: 2018-11-22 | Discharge: 2018-11-22 | Disposition: A | Discharge status: Home / Self Care | Payer: BC Managed Care – PPO | Source: Ambulatory Visit | Attending: Radiation Oncology | Admitting: Radiation Oncology

## 2018-11-22 ENCOUNTER — Ambulatory Visit: Payer: BC Managed Care – PPO | Admitting: Radiation Oncology

## 2018-11-22 DIAGNOSIS — C50411 Malignant neoplasm of upper-outer quadrant of right female breast: Secondary | ICD-10-CM | POA: Diagnosis not present

## 2018-11-25 ENCOUNTER — Ambulatory Visit
Admission: RE | Admit: 2018-11-25 | Discharge: 2018-11-25 | Disposition: A | Discharge status: Home / Self Care | Payer: BC Managed Care – PPO | Source: Ambulatory Visit | Attending: Radiation Oncology | Admitting: Radiation Oncology

## 2018-11-25 DIAGNOSIS — C50411 Malignant neoplasm of upper-outer quadrant of right female breast: Secondary | ICD-10-CM | POA: Diagnosis not present

## 2018-11-26 ENCOUNTER — Ambulatory Visit
Admission: RE | Admit: 2018-11-26 | Discharge: 2018-11-26 | Disposition: A | Discharge status: Home / Self Care | Payer: BC Managed Care – PPO | Source: Ambulatory Visit | Attending: Radiation Oncology | Admitting: Radiation Oncology

## 2018-11-26 DIAGNOSIS — C50411 Malignant neoplasm of upper-outer quadrant of right female breast: Secondary | ICD-10-CM | POA: Diagnosis not present

## 2018-11-27 ENCOUNTER — Ambulatory Visit
Admission: RE | Admit: 2018-11-27 | Discharge: 2018-11-27 | Disposition: A | Discharge status: Home / Self Care | Payer: BC Managed Care – PPO | Source: Ambulatory Visit | Attending: Radiation Oncology | Admitting: Radiation Oncology

## 2018-11-27 DIAGNOSIS — C50411 Malignant neoplasm of upper-outer quadrant of right female breast: Secondary | ICD-10-CM | POA: Diagnosis not present

## 2018-11-28 ENCOUNTER — Ambulatory Visit
Admission: RE | Admit: 2018-11-28 | Discharge: 2018-11-28 | Disposition: A | Discharge status: Home / Self Care | Payer: BC Managed Care – PPO | Source: Ambulatory Visit | Attending: Radiation Oncology | Admitting: Radiation Oncology

## 2018-11-28 DIAGNOSIS — C50411 Malignant neoplasm of upper-outer quadrant of right female breast: Secondary | ICD-10-CM | POA: Diagnosis not present

## 2018-11-29 ENCOUNTER — Ambulatory Visit
Admission: RE | Admit: 2018-11-29 | Discharge: 2018-11-29 | Disposition: A | Discharge status: Home / Self Care | Payer: BC Managed Care – PPO | Source: Ambulatory Visit | Attending: Radiation Oncology | Admitting: Radiation Oncology

## 2018-11-29 DIAGNOSIS — C50411 Malignant neoplasm of upper-outer quadrant of right female breast: Secondary | ICD-10-CM | POA: Diagnosis not present

## 2018-12-02 ENCOUNTER — Ambulatory Visit
Admission: RE | Admit: 2018-12-02 | Discharge: 2018-12-02 | Disposition: A | Discharge status: Home / Self Care | Payer: BC Managed Care – PPO | Source: Ambulatory Visit | Attending: Radiation Oncology | Admitting: Radiation Oncology

## 2018-12-02 DIAGNOSIS — C50411 Malignant neoplasm of upper-outer quadrant of right female breast: Secondary | ICD-10-CM | POA: Diagnosis not present

## 2018-12-03 ENCOUNTER — Ambulatory Visit
Admission: RE | Admit: 2018-12-03 | Discharge: 2018-12-03 | Disposition: A | Discharge status: Home / Self Care | Payer: BC Managed Care – PPO | Source: Ambulatory Visit | Attending: Radiation Oncology | Admitting: Radiation Oncology

## 2018-12-03 ENCOUNTER — Encounter: Payer: Self-pay | Admitting: Radiation Oncology

## 2018-12-03 DIAGNOSIS — C50411 Malignant neoplasm of upper-outer quadrant of right female breast: Secondary | ICD-10-CM | POA: Diagnosis not present

## 2018-12-05 ENCOUNTER — Ambulatory Visit (HOSPITAL_COMMUNITY)
Admission: RE | Admit: 2018-12-05 | Discharge: 2018-12-05 | Disposition: A | Discharge status: Home / Self Care | Payer: BC Managed Care – PPO | Source: Ambulatory Visit | Attending: Internal Medicine | Admitting: Internal Medicine

## 2018-12-05 ENCOUNTER — Ambulatory Visit (HOSPITAL_BASED_OUTPATIENT_CLINIC_OR_DEPARTMENT_OTHER)
Admission: RE | Admit: 2018-12-05 | Discharge: 2018-12-05 | Disposition: A | Discharge status: Home / Self Care | Payer: BC Managed Care – PPO | Source: Ambulatory Visit | Attending: Internal Medicine | Admitting: Internal Medicine

## 2018-12-05 ENCOUNTER — Encounter (HOSPITAL_COMMUNITY): Payer: Self-pay | Admitting: Internal Medicine

## 2018-12-05 VITALS — BP 125/74 | HR 72 | Wt 190.0 lb

## 2018-12-05 DIAGNOSIS — I10 Essential (primary) hypertension: Secondary | ICD-10-CM | POA: Insufficient documentation

## 2018-12-05 DIAGNOSIS — I427 Cardiomyopathy due to drug and external agent: Secondary | ICD-10-CM | POA: Diagnosis not present

## 2018-12-05 DIAGNOSIS — Z803 Family history of malignant neoplasm of breast: Secondary | ICD-10-CM | POA: Insufficient documentation

## 2018-12-05 DIAGNOSIS — C50411 Malignant neoplasm of upper-outer quadrant of right female breast: Secondary | ICD-10-CM

## 2018-12-05 DIAGNOSIS — Z923 Personal history of irradiation: Secondary | ICD-10-CM | POA: Insufficient documentation

## 2018-12-05 DIAGNOSIS — T451X5A Adverse effect of antineoplastic and immunosuppressive drugs, initial encounter: Secondary | ICD-10-CM

## 2018-12-05 DIAGNOSIS — C50911 Malignant neoplasm of unspecified site of right female breast: Secondary | ICD-10-CM | POA: Diagnosis present

## 2018-12-05 DIAGNOSIS — Z170 Estrogen receptor positive status [ER+]: Secondary | ICD-10-CM

## 2018-12-05 MED ORDER — CARVEDILOL 3.125 MG PO TABS: 3.1250 mg | ORAL_TABLET | Freq: Two times a day (BID) | ORAL | 6 refills | Status: DC

## 2018-12-05 NOTE — Progress Notes (Signed)
  Echocardiogram 2D Echocardiogram has been performed.  Johny Chess 12/05/2018, 10:44 AM

## 2018-12-05 NOTE — Progress Notes (Signed)
CARDIO-ONCOLOGY CLINIC CONSULT NOTE  Referring Physician: Magrinat Primary Cardiologist: Suzana Sohail (new)  HPI:  Anna Conrad is 52 y.o. female with right breast cancer referred by Dr. Jana Hakim for enrollment into the Cardio-Oncology program.  Anna Conrad had routine screening mammography on 01/29/2018 showing a possible mass with associated calcifications in the right breast. She underwent unilateral right diagnostic mammography with tomography and right breast ultrasonography at [facility] on 02/05/2018 showing a highly suspicious mass over the 10:30 position upper outer of the right breast located 6 cm from the nipple measuring 1.3 x 1.4 x 1.5 cm. Associated microcalcifications corresponding to the mammographic abnormality. Ultrasound of the right axilla was normal.  Accordingly on 02/29/2019 she proceeded to biopsy of the right breast area in question. 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 Prognostic indicators significant for: estrogen receptor, 100% positive with strong staining intensity and progesterone receptor, 20% positive with moderate staining intensity. Proliferation marker Ki67 at 10%. HER2 not amplified with ratios HER2/CEP17 signals 1.17 and average HER2 copies per cell 2.45.  Lumpectomy in 3/19  Completed chemo with 4 cycles of Doxoribucin and Cyclophosphamide in 10/19. Started weekly paclitaxel on 09/20/2018 being day 1 cycle 7. However that treatment was held due to peripheral neuropathy.   Just finished 30 treatments of XRT.   Here for first visit to [facility]. Doing well. No SOB orthopnea or PND. No edema. BP has been well controlled at home with SBP 105-115.     Echo 6/19 EF 60-65% GLS -23%  Echo today (12/05/18) EF 50-55%  GLS -14.8%    Review of Systems: [y] = yes, _0  = no   General: Weight gain _1 ; Weight loss _2 ; Anorexia _3 ; Fatigue _4 ; Fever _5 ;  Chills _6 ; Weakness _7   Cardiac: Chest pain/pressure _8 ; Resting SOB _9 ; Exertional SOB _10 ; Orthopnea _11 ; Pedal Edema _12 ; Palpitations _13 ; Syncope _14 ; Presyncope _15 ; Paroxysmal nocturnal dyspnea_16   Pulmonary: Cough _17 ; Wheezing_18 ; Hemoptysis_19 ; Sputum _20 ; Snoring _21   GI: Vomiting_22 ; Dysphagia_23 ; Melena_24 ; Hematochezia _25 ; Heartburn_26 ; Abdominal pain _27 ; Constipation _28 ; Diarrhea _29 ; BRBPR _30   GU: Hematuria_31 ; Dysuria _32 ; Nocturia_33   Vascular: Pain in legs with walking _34 ; Pain in feet with lying flat _35 ; Non-healing sores _36 ; Stroke _37 ; TIA _38 ; Slurred speech _39 ;  Neuro: Headaches_40 ; Vertigo_41 ; Seizures_42 ; Paresthesias_43 ;Blurred vision _44 ; Diplopia _45 ; Vision changes _46   Ortho/Skin: Arthritis _47 ; Joint pain _48 ; Muscle pain _49 ; Joint swelling _50 ; Back Pain _51 ; Rash _52   Psych: Depression_53 ; Anxiety_54   Heme: Bleeding problems _55 ; Clotting disorders _56 ; Anemia _57   Endocrine: Diabetes _58 ; Thyroid dysfunction_59    Past Medical History:  Diagnosis Date  . Blood dyscrasia    hx blood clott superficial in rt arm  . Cancer [facility]) 2019   right breast cancer  . Family history of breast cancer   . Heart murmur   . Hypertension     Current Outpatient Medications  Medication Sig Dispense Refill  . lisinopril-hydrochlorothiazide (PRINZIDE,ZESTORETIC) 10-12.5 MG tablet Take 1 tablet by mouth daily.     No current facility-administered medications for this encounter.     Allergies  Allergen Reactions  . Adhesive [Tape]  Rash  . Latex Rash    Red  rash      Social History   Socioeconomic History  . Marital status: Married    Spouse name: Not on file  . Number of children: Not on file  . Years of education: Not on file  . Highest education level: Not on file  Occupational History  . Not on file  Social Needs  . Financial resource strain: Not on file  . Food insecurity:    Worry: Not on file    Inability: Not on file  .  Transportation needs:    Medical: No    Non-medical: No  Tobacco Use  . Smoking status: Never Smoker  . Smokeless tobacco: Never Used  Substance and Sexual Activity  . Alcohol use: Yes    Comment: occ  . Drug use: Never  . Sexual activity: Yes    Birth control/protection: Surgical    Comment: BTL  Lifestyle  . Physical activity:    Days per week: Not on file    Minutes per session: Not on file  . Stress: Not on file  Relationships  . Social connections:    Talks on phone: Not on file    Gets together: Not on file    Attends religious service: Not on file    Active member of club or organization: Not on file    Attends meetings of clubs or organizations: Not on file    Relationship status: Not on file  . Intimate partner violence:    Fear of current or ex partner: No    Emotionally abused: No    Physically abused: No    Forced sexual activity: No  Other Topics Concern  . Not on file  Social History Narrative  . Not on file      Family History  Problem Relation Age of Onset  . Breast cancer Sister 71       approximate  . Cancer Father 30       started in shoulder, spread to spine, brain, lung  . Cancer Paternal [name]        'blood cancer'  . Heart attack Paternal Uncle   . Breast cancer Cousin        age dx unk  . Breast cancer Cousin        age dx unk  . Breast cancer Cousin        age dx unk    Vitals:   12/05/18 1107  BP: 125/74  Pulse: 72  SpO2: 98%  Weight: 86.2 kg (190 lb)    PHYSICAL EXAM: General:  Well appearing. No respiratory difficulty HEENT: normal Neck: supple. no JVD. Carotids 2+ bilat; no bruits. No lymphadenopathy or thryomegaly appreciated. Cor: PMI nondisplaced. Regular rate & rhythm. No rubs, gallops or murmurs. Lungs: clear Abdomen: obese soft, nontender, nondistended. No hepatosplenomegaly. No bruits or masses. Good bowel sounds. Extremities: no cyanosis, clubbing, rash, edema Neuro: alert & oriented x 3, cranial nerves  grossly intact. moves all 4 extremities w/o difficulty. Affect pleasant.    ASSESSMENT & PLAN:  1. Right Breast Cancer - Diagnosed 3/19. ER + PR - HER2 - - s/p Lumpectomy in 3/19 - Completed chemo with 4 cycles of Doxoribucin and Cyclophosphamide in 10/19. Started weekly paclitaxel on 09/20/2018 being day 1 cycle 7. However that treatment was held due to peripheral neuropathy.  - Completed XRT 12/19  - Echo 7/19 EF 60-65% GLS -23% - Echo today EF 50-55% GLS -14.8 -  Echos reviewed personally. 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 She is already on Lisinopril 10/HCTZ 12.5. Will add carvedilol 3.125 bid  - Repeat echo in 2 months.   2. HTN - well controlled   Glori Bickers, MD  11:34 AM

## 2018-12-05 NOTE — Patient Instructions (Signed)
START Carvedilol 3.125mg  (1 tab) twice daily  Your physician has requested that you have an echocardiogram. Echocardiography is a painless test that uses sound waves to create images of your heart. It provides your doctor with information about the size and shape of your heart and how well your heart's chambers and valves are working. This procedure takes approximately one hour. There are no restrictions for this procedure.  Follow up with Dr. Haroldine Laws in 2 months.

## 2018-12-09 NOTE — Progress Notes (Signed)
  Radiation Oncology         [phone]) [phone] ________________________________  Name: Anna Conrad MRN: [id number]  Date: 12/03/2018  DOB: 1966-07-18  End of Treatment Note  Diagnosis:   52 y.o. 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 This was delivered using a 3-D conformal technique. The patient then received a boost to the seroma. This delivered an additional 10 Gy in 5 fractions using 6x photons with a Complex Isodose technique. The total dose was 60.4 Gy.  Narrative: The patient tolerated radiation treatment relatively well.   The patient had some expected skin irritation with erythema and hyperpigmentation as she progressed during treatment. Moist desquamation was not present at the end of treatment. She is applying Radiaplex and hydrocortisone creams as directed. She also noted increased fatigue.  Plan: The patient has completed radiation treatment. The patient will return to radiation oncology clinic for routine followup in one month. I advised the patient to call or return sooner if they have any questions or concerns related to their recovery or treatment. ________________________________  Jodelle Gross, MD, PhD  This document serves as a record of services personally performed by Kyung Rudd, MD. It was created on his behalf by Rae Lips, a trained medical scribe. The creation of this record is based on the scribe's personal observations and the provider's statements to them. This document has been checked and approved by the attending provider.

## 2018-12-19 NOTE — Progress Notes (Signed)
[place]  Telephone:([phone]) [phone] Fax:(336) [phone]     ID: Anna Conrad DOB: Apr 20, 1966  MR#: [id number]  QQI#:[id number]  Patient Care Team: Filiberto Pinks as PCP - General (Physician Assistant) Magrinat, Virgie Dad, MD as Consulting Physician (Oncology) Kyung Rudd, MD as Consulting Physician (Radiation Oncology) [name], MD as Consulting Physician (General Surgery) Janyth Pupa, DO as Consulting Physician (Obstetrics and Gynecology) Janyth Pupa, DO as Consulting Physician (Obstetrics and Gynecology) OTHER MD:   CHIEF COMPLAINT: Estrogen receptor positive breast cancer   CURRENT TREATMENT: Tamoxifen  INTERVAL HISTORY: Anna Conrad returns today for follow-up and treatment of her estrogen receptor positive breast cancer.   The patient completed adjuvant radiation, which ran from 10/16/2018 to 12/03/2018. Site/dose: The patient initially received a dose of 50.4 Gy in 28 fractions to the right breast using whole-breast tangent fields. This was delivered using a 3-D conformal technique. The patient then received a boost to the seroma. This delivered an additional 10 Gy in 5 fractions using 6x photons with a Complex Isodose technique. The total dose was 60.4 Gy. She states that her skin held up well and she did not have blistering. She had some fatigue, and slept more than usual at night  Anna Conrad is being followed closely by Dr. Haroldine Laws because of the concern regarding a drop in her cardiac function.Yvett's last echocardiogram on 12/05/2018, showed an ejection fraction of 55%.  She is scheduled for further testing March   REVIEW OF SYSTEMS: Anna Conrad has been walking for exercise; she [place] land and is able to walk through the trails. She has also rides bikes with her husband on occation. She has not had any periods since chemotherapy started in 05/2018. The patient denies unusual headaches, visual changes, nausea, vomiting, or dizziness.  There has been no unusual cough, phlegm production, or pleurisy. This been no change in bowel or bladder habits. The patient denies unexplained fatigue or unexplained weight loss, bleeding, rash, or fever. A detailed review of systems was otherwise noncontributory.    HISTORY OF CURRENT ILLNESS: From the original intake note:  Anna Conrad had routine screening mammography on 01/29/2018 showing a possible mass with associated calcifications in the right breast. She underwent unilateral right diagnostic mammography with tomography and right breast ultrasonography at [facility] on 02/05/2018 showing a highly suspicious mass over the 10:30 position upper outer of the right breast located 6 cm from the nipple measuring 1.3 x 1.4 x 1.5 cm. Associated microcalcifications corresponding to the mammographic abnormality. Ultrasound of the right axilla was normal.  Accordingly on 02/29/2019 she proceeded to biopsy of the right breast area in question. 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 Prognostic indicators significant for: estrogen receptor, 100% positive with strong staining intensity and progesterone receptor, 20% positive iwht moderate staining intensity. Proliferation marker Ki67 at 10%. HER2 not amplified with ratios HER2/CEP17 signals 1.17 and average HER2 copies per cell 2.45.  On 02/21/2018 the patient had bilateral breast MRI.  This measured the upper outer quadrant right breast mass at 2.3 cm including an area of surrounding non-masslike enhancement.  There was no evidence of multifocal or multicentric disease, no lymphadenopathy, and no findings in the left breast.  The patient's subsequent history is as detailed below.    PAST MEDICAL HISTORY: Past Medical History:  Diagnosis Date  . Blood dyscrasia    hx blood clott superficial in rt arm  . Cancer ([place])  2019   right breast cancer  . Family  history of breast cancer   . Heart murmur   . Hypertension   She used to have migraines. She denies GERD, asthma, emphysema. She was told that she had a slight heart murmur. She denies heart palpitations.   PAST SURGICAL HISTORY: Past Surgical History:  Procedure Laterality Date  . BREAST LUMPECTOMY WITH RADIOACTIVE SEED AND SENTINEL LYMPH NODE BIOPSY Right 03/18/2018   Procedure: RIGHT BREAST PARTIAL MASTECTOMY WITH RADIOACTIVE SEED AND SENTINEL LYMPH NODE BIOPSY;  Surgeon: [name], MD;  Location: [facility];  Service: General;  Laterality: Right;  . cesearean section     tubal ligation   . IR CV LINE INJECTION  08/28/2018  . IR IMAGING GUIDED PORT INSERTION  09/04/2018  . IR REMOVAL TUN ACCESS W/ PORT W/O FL MOD SED  09/04/2018  . PORTACATH PLACEMENT Left 05/27/2018   Procedure: INSERTION PORT-A-CATH;  Surgeon: [name], MD;  Location: [facility];  Service: General;  Laterality: Left;  . TUBAL LIGATION      2 Caesarian sections- tubal ligation at the second c-section.  FAMILY HISTORY Family History  Problem Relation Age of Onset  . Breast cancer Sister 48       approximate  . Cancer Father 63       started in shoulder, spread to spine, brain, lung  . Cancer Paternal [name]        'blood cancer'  . Heart attack Paternal Uncle   . Breast cancer Cousin        age dx unk  . Breast cancer Cousin        age dx unk  . Breast cancer Cousin        age dx unk   The patient's father passed away in the summer 2018 at the age of 22 due to metastatic lung cancer. He was a previous smoker. The patient's mother is alive at age 68. The patient has 1 brother and 1 sister. The patient's sister was diagnosed with breast cancer at the age of 76.  The patient has 3 paternal cousins diagnosed with breast cancer. She denies a family history of ovarian cancer.    GYNECOLOGIC HISTORY:  The patient's last menstrual period was in 05/2018. Menarche: 53 years old. She used to be  on a dance team.  Age at first live birth: 53 years old She is [place] P2. She was taking oral contraceptives, but this was discontinued as of January 2019.  She stopped having periods at the time of her chemotherapy June 2019.   She had bilateral tubal ligation at the time of her second c-section.    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  She has a PhD.  Her husband, [name], is a Chief Strategy Officer for [employer], making metal parts. He travels about 2-3 times per year. Their sons are [name] age 6, and [name] age 56. She attends [employer].     ADVANCED DIRECTIVES:    HEALTH MAINTENANCE: Social History   Tobacco Use  . Smoking status: Never Smoker  . Smokeless tobacco: Never Used  Substance Use Topics  . Alcohol use: Yes    Comment: occ  . Drug use: Never     Colonoscopy:n/a  PAP: 2017/ normal  Bone density: n/a   Allergies  Allergen Reactions  . Adhesive [Tape] Rash  . Latex Rash    Red  rash    Current Outpatient  Medications  Medication Sig Dispense Refill  . carvedilol (COREG) 3.125 MG tablet Take 1 tablet (3.125 mg total) by mouth 2 (two) times daily with a meal. 60 tablet 6  . lisinopril-hydrochlorothiazide (PRINZIDE,ZESTORETIC) 10-12.5 MG tablet Take 1 tablet by mouth daily.    . tamoxifen (NOLVADEX) 20 MG tablet Take 1 tablet (20 mg total) by mouth daily for 30 days. 90 tablet 12   No current facility-administered medications for this visit.     OBJECTIVE: Latest white woman appears stated age  80:   12/20/18 1342  BP: 117/79  Pulse: 86  Resp: 18  Temp: 98.9 F (37.2 C)  SpO2: 99%     Body mass index is 32.58 kg/m.   Wt Readings from Last 3 Encounters:  12/20/18 189 lb 12.8 oz (86.1 kg)  12/05/18 190 lb (86.2 kg)  10/03/18 185 lb 12.8 oz (84.3 kg)   ECOG FS:1 - Symptomatic but completely ambulatory  Sclerae unicteric, pupils round and equal Oropharynx clear and moist No cervical or  supraclavicular adenopathy Lungs no rales or rhonchi Heart regular rate and rhythm Abd soft, nontender, positive bowel sounds MSK no focal spinal tenderness, no upper extremity lymphedema Neuro: nonfocal, well oriented, appropriate affect Breasts: The right breast is status post lumpectomy and radiation.  There is still minimal hyperpigmentation.  The breast is slightly smaller than the left.  Both nipples appear similar.  Both axillae are benign  LAB RESULTS:  CMP     Component Value Date/Time   NA 141 12/20/2018 1312   K 3.7 12/20/2018 1312   CL 104 12/20/2018 1312   CO2 29 12/20/2018 1312   GLUCOSE 101 (H) 12/20/2018 1312   BUN 19 12/20/2018 1312   CREATININE 0.95 12/20/2018 1312   CALCIUM 9.2 12/20/2018 1312   PROT 6.9 12/20/2018 1312   ALBUMIN 4.0 12/20/2018 1312   AST 26 12/20/2018 1312   ALT 37 12/20/2018 1312   ALKPHOS 57 12/20/2018 1312   BILITOT 0.4 12/20/2018 1312   GFRNONAA >60 12/20/2018 1312   GFRAA >60 12/20/2018 1312    No results found for: TOTALPROTELP, ALBUMINELP, A1GS, A2GS, BETS, BETA2SER, GAMS, MSPIKE, SPEI  No results found for: KPAFRELGTCHN, LAMBDASER, KAPLAMBRATIO  Lab Results  Component Value Date   WBC 5.6 12/20/2018   NEUTROABS 3.5 12/20/2018   HGB 12.1 12/20/2018   HCT 35.8 (L) 12/20/2018   MCV 92.3 12/20/2018   PLT 255 12/20/2018    @LASTCHEMISTRY @  No results found for: LABCA2  No components found for: ESPQZR007  No results for input(s): INR in the last 168 hours.  No results found for: LABCA2  No results found for: MAU633  No results found for: HLK562  No results found for: BWL893  No results found for: CA2729  No components found for: HGQUANT  No results found for: CEA1 / No results found for: CEA1   No results found for: AFPTUMOR  No results found for: CHROMOGRNA  No results found for: PSA1  Appointment on 12/20/2018  Component Date Value Ref Range Status  . Sodium 12/20/2018 141  135 - 145 mmol/L Final  .  Potassium 12/20/2018 3.7  3.5 - 5.1 mmol/L Final  . Chloride 12/20/2018 104  98 - 111 mmol/L Final  . CO2 12/20/2018 29  22 - 32 mmol/L Final  . Glucose, Bld 12/20/2018 101* 70 - 99 mg/dL Final  . BUN 12/20/2018 19  6 - 20 mg/dL Final  . Creatinine 12/20/2018 0.95  0.44 - 1.00 mg/dL Final  .  Calcium 12/20/2018 9.2  8.9 - 10.3 mg/dL Final  . Total Protein 12/20/2018 6.9  6.5 - 8.1 g/dL Final  . Albumin 12/20/2018 4.0  3.5 - 5.0 g/dL Final  . AST 12/20/2018 26  15 - 41 U/L Final  . ALT 12/20/2018 37  0 - 44 U/L Final  . Alkaline Phosphatase 12/20/2018 57  38 - 126 U/L Final  . Total Bilirubin 12/20/2018 0.4  0.3 - 1.2 mg/dL Final  . GFR, Est Non Af Am 12/20/2018 >60  >60 mL/min Final  . GFR, Est AFR Am 12/20/2018 >60  >60 mL/min Final  . Anion gap 12/20/2018 8  5 - 15 Final   Performed at [facility], [place] [address], [place], [place] [address]  . WBC Count 12/20/2018 5.6  4.0 - 10.5 K/uL Final  . RBC 12/20/2018 3.88  3.87 - 5.11 MIL/uL Final  . Hemoglobin 12/20/2018 12.1  12.0 - 15.0 g/dL Final  . HCT 12/20/2018 35.8* 36.0 - 46.0 % Final  . MCV 12/20/2018 92.3  80.0 - 100.0 fL Final  . MCH 12/20/2018 31.2  26.0 - 34.0 pg Final  . MCHC 12/20/2018 33.8  30.0 - 36.0 g/dL Final  . RDW 12/20/2018 12.5  11.5 - 15.5 % Final  . Platelet Count 12/20/2018 255  150 - 400 K/uL Final  . nRBC 12/20/2018 0.0  0.0 - 0.2 % Final  . Neutrophils Relative % 12/20/2018 62  % Final  . Neutro Abs 12/20/2018 3.5  1.7 - 7.7 K/uL Final  . Lymphocytes Relative 12/20/2018 22  % Final  . Lymphs Abs 12/20/2018 1.2  0.7 - 4.0 K/uL Final  . Monocytes Relative 12/20/2018 9  % Final  . Monocytes Absolute 12/20/2018 0.5  0.1 - 1.0 K/uL Final  . Eosinophils Relative 12/20/2018 6  % Final  . Eosinophils Absolute 12/20/2018 0.3  0.0 - 0.5 K/uL Final  . Basophils Relative 12/20/2018 1  % Final  . Basophils Absolute 12/20/2018 0.0  0.0 - 0.1 K/uL Final  . Immature Granulocytes 12/20/2018 0  %  Final  . Abs Immature Granulocytes 12/20/2018 0.01  0.00 - 0.07 K/uL Final   Performed at [facility], [address] [place], [place] [address]    (this displays the last labs from the last 3 days)  No results found for: TOTALPROTELP, ALBUMINELP, A1GS, A2GS, BETS, BETA2SER, GAMS, MSPIKE, SPEI (this displays SPEP labs)  No results found for: KPAFRELGTCHN, LAMBDASER, KAPLAMBRATIO (kappa/lambda light chains)  No results found for: HGBA, HGBA2QUANT, HGBFQUANT, HGBSQUAN (Hemoglobinopathy evaluation)   No results found for: LDH  No results found for: IRON, TIBC, IRONPCTSAT (Iron and TIBC)  No results found for: FERRITIN  Urinalysis    Component Value Date/Time   COLORURINE YELLOW 06/25/2018 0815   APPEARANCEUR HAZY (A) 06/25/2018 0815   LABSPEC 1.019 06/25/2018 0815   PHURINE 6.0 06/25/2018 0815   GLUCOSEU NEGATIVE 06/25/2018 0815   HGBUR NEGATIVE 06/25/2018 0815   BILIRUBINUR NEGATIVE 06/25/2018 0815   KETONESUR NEGATIVE 06/25/2018 0815   PROTEINUR NEGATIVE 06/25/2018 0815   NITRITE NEGATIVE 06/25/2018 0815   LEUKOCYTESUR NEGATIVE 06/25/2018 0815     STUDIES: No results found.   ELIGIBLE FOR AVAILABLE RESEARCH PROTOCOL: BCEP   ASSESSMENT: 53 y.o. [place], [place] woman status post right breast upper outer quadrant biopsy 02/07/2018 for a clinical T1c-T2 N0, stage IA-B invasive ductal carcinoma, grade 2, estrogen receptor and progesterone receptor positive, HER-2 not amplified, with an MIB-1 of 10%  (1) status  post right lumpectomy 03/18/2018 for a pT1c pN0, stage IA invasive ductal carcinoma, grade 2, estrogen and progesterone receptor positive, with negative margins.  A total of 1 lymph node was removed  (2) Mammaprint on 03/18/2018 showed high risk, indicating that with chemotherapy and hormone therapy, the patient would have a nearly 95% chance of having no distant metastases within the next 5 years  (3) chemotherapy consisting of doxorubicin and  cyclophosphamide in dose dense fashion x4 started 06/04/2018, completed 07/23/2018 followed by weekly paclitaxel started 08/09/2018, discontinued 09/13/2018  (a) baseline echocardiogram on 05/23/2018 showed an ejection fraction in the 65-75% range  (b) adjuvant paclitaxel discontinued after 6 cycles because of neuropathy  (4) adjuvant radiation 10/16/2018 - 12/03/2018  Site/dose: The patient initially received a dose of 50.4 Gy in 28 fractions to the right breast using whole-breast tangent fields. This was delivered using a 3-D conformal technique. The patient then received a boost to the seroma. This delivered an additional 10 Gy in 5 fractions using 6x photons with a Complex Isodose technique. The total dose was 60.4 Gy.  (5) antiestrogens to follow at the completion of local treatment  (6) genetics testing 03/13/2018 through the Common Hereditary Cancer Panel offered by [facility] ifound no deleterious mutations in APC, ATM, AXIN2, BARD1, BMPR1A, BRCA1, BRCA2, BRIP1, CDH1, CDKN2A (p14ARF), CDKN2A (p16INK4a), CKD4, CHEK2, CTNNA1, DICER1, EPCAM (Deletion/duplication testing only), GREM1 (promoter region deletion/duplication testing only), KIT, MEN1, MLH1, MSH2, MSH3, MSH6, MUTYH, NBN, NF1, NHTL1, PALB2, PDGFRA, PMS2, POLD1, POLE, PTEN, RAD50, RAD51C, RAD51D, SDHB, SDHC, SDHD, SMAD4, SMARCA4. STK11, TP53, TSC1, TSC2, and VHL.  The following genes were evaluated for sequence changes only: SDHA and HOXB13 c.251G>A variant only.  (a)  A variant of uncertain significance (VUS) in the gene MSH3 was also identified c.1027+4T>C ([employer]).    PLAN:  Anna Conrad did very well with her radiation treatments and is recovering on schedule.  I would think in another 3 months or so her energy level will be back to normal.  Her periods stopped with chemotherapy.  They could resume or not.  We are going to go with tamoxifen in terms of antiestrogens and we discussed the possible toxicity side effects and complications of  this agent in detail today.  She understands that if she has pain with intercourse or significant vaginal dryness issues she can use vaginal estrogen safely while on this medication  He understands that tamoxifen is not a contraceptive, however she is status post bilateral tubal ligation.  She is ready to have her port removed and I have entered an order to have this done within the next week or so  She will have a repeat echo in March and she will also see her primary care physician around that time.  Late March she will have repeat mammography and then she will see me again in April.  She knows to call for any other issue that may develop before that visit.      Magrinat, Virgie Dad, MD  12/20/18 2:10 PM Medical Oncology and Hematology [facility] [address] [place], [place] [address] Tel. [phone]    Fax. [phone]  I, Jacqualyn Posey am acting as a Education administrator for Chauncey Cruel, MD.   I, Lurline Del MD, have reviewed the above documentation for accuracy and completeness, and I agree with the above.

## 2018-12-20 ENCOUNTER — Inpatient Hospital Stay (HOSPITAL_BASED_OUTPATIENT_CLINIC_OR_DEPARTMENT_OTHER): Payer: BC Managed Care – PPO | Admitting: Oncology

## 2018-12-20 ENCOUNTER — Inpatient Hospital Stay: Payer: BC Managed Care – PPO

## 2018-12-20 ENCOUNTER — Inpatient Hospital Stay: Payer: BC Managed Care – PPO | Attending: Oncology

## 2018-12-20 VITALS — BP 117/79 | HR 86 | Temp 98.9°F | Resp 18 | Ht 64.0 in | Wt 189.8 lb

## 2018-12-20 DIAGNOSIS — Z170 Estrogen receptor positive status [ER+]: Secondary | ICD-10-CM | POA: Diagnosis not present

## 2018-12-20 DIAGNOSIS — Z79810 Long term (current) use of selective estrogen receptor modulators (SERMs): Secondary | ICD-10-CM | POA: Diagnosis not present

## 2018-12-20 DIAGNOSIS — Z452 Encounter for adjustment and management of vascular access device: Secondary | ICD-10-CM | POA: Diagnosis not present

## 2018-12-20 DIAGNOSIS — C50411 Malignant neoplasm of upper-outer quadrant of right female breast: Secondary | ICD-10-CM | POA: Insufficient documentation

## 2018-12-20 DIAGNOSIS — Z95828 Presence of other vascular implants and grafts: Secondary | ICD-10-CM

## 2018-12-20 LAB — CBC WITH DIFFERENTIAL (CANCER CENTER ONLY)
Abs Immature Granulocytes: 0.01 10*3/uL (ref 0.00–0.07)
Basophils Absolute: 0 10*3/uL (ref 0.0–0.1)
Basophils Relative: 1 %
Eosinophils Absolute: 0.3 10*3/uL (ref 0.0–0.5)
Eosinophils Relative: 6 %
HCT: 35.8 % — ABNORMAL LOW (ref 36.0–46.0)
Hemoglobin: 12.1 g/dL (ref 12.0–15.0)
Immature Granulocytes: 0 %
Lymphocytes Relative: 22 %
Lymphs Abs: 1.2 10*3/uL (ref 0.7–4.0)
MCH: 31.2 pg (ref 26.0–34.0)
MCHC: 33.8 g/dL (ref 30.0–36.0)
MCV: 92.3 fL (ref 80.0–100.0)
Monocytes Absolute: 0.5 10*3/uL (ref 0.1–1.0)
Monocytes Relative: 9 %
Neutro Abs: 3.5 10*3/uL (ref 1.7–7.7)
Neutrophils Relative %: 62 %
Platelet Count: 255 10*3/uL (ref 150–400)
RBC: 3.88 MIL/uL (ref 3.87–5.11)
RDW: 12.5 % (ref 11.5–15.5)
WBC Count: 5.6 10*3/uL (ref 4.0–10.5)
nRBC: 0 % (ref 0.0–0.2)

## 2018-12-20 LAB — CMP (CANCER CENTER ONLY)
ALK PHOS: 57 U/L (ref 38–126)
ALT: 37 U/L (ref 0–44)
AST: 26 U/L (ref 15–41)
Albumin: 4 g/dL (ref 3.5–5.0)
Anion gap: 8 (ref 5–15)
BUN: 19 mg/dL (ref 6–20)
CALCIUM: 9.2 mg/dL (ref 8.9–10.3)
CO2: 29 mmol/L (ref 22–32)
Chloride: 104 mmol/L (ref 98–111)
Creatinine: 0.95 mg/dL (ref 0.44–1.00)
GFR, Est AFR Am: >60 mL/min (ref >60)
GFR, Estimated: >60 mL/min (ref >60)
Glucose, Bld: 101 mg/dL — ABNORMAL HIGH (ref 70–99)
Potassium: 3.7 mmol/L (ref 3.5–5.1)
Sodium: 141 mmol/L (ref 135–145)
Total Bilirubin: 0.4 mg/dL (ref 0.3–1.2)
Total Protein: 6.9 g/dL (ref 6.5–8.1)

## 2018-12-20 MED ORDER — HEPARIN SOD (PORK) LOCK FLUSH 100 UNIT/ML IV SOLN
500.0000 [IU] | Freq: Once | INTRAVENOUS | Status: AC | PRN
  Administered 2018-12-20: 500 [IU]
  Filled 2018-12-20: qty 5

## 2018-12-20 MED ORDER — TAMOXIFEN CITRATE 20 MG PO TABS: 20.0000 mg | ORAL_TABLET | Freq: Every day | ORAL | 12 refills | Status: AC

## 2018-12-20 MED ORDER — SODIUM CHLORIDE 0.9% FLUSH
10.0000 mL | INTRAVENOUS | Status: DC | PRN
  Administered 2018-12-20: 10 mL
  Filled 2018-12-20: qty 10

## 2018-12-24 ENCOUNTER — Encounter: Payer: Self-pay | Admitting: *Deleted

## 2018-12-27 ENCOUNTER — Telehealth: Payer: Self-pay | Admitting: Adult Health

## 2018-12-27 NOTE — Telephone Encounter (Signed)
Scheduled appt per 1/14 sch message - sent reminder letter in the mail with Magrinat 4/2 appt and scp appt

## 2019-01-06 ENCOUNTER — Ambulatory Visit: Payer: BC Managed Care – PPO | Admitting: Radiation Oncology

## 2019-01-14 ENCOUNTER — Other Ambulatory Visit: Payer: Self-pay | Admitting: Radiology

## 2019-01-15 ENCOUNTER — Other Ambulatory Visit: Payer: Self-pay | Admitting: Radiology

## 2019-01-16 ENCOUNTER — Ambulatory Visit (HOSPITAL_COMMUNITY)
Admission: RE | Admit: 2019-01-16 | Discharge: 2019-01-16 | Disposition: A | Discharge status: Home / Self Care | Payer: BC Managed Care – PPO | Source: Ambulatory Visit

## 2019-01-16 ENCOUNTER — Encounter (HOSPITAL_COMMUNITY): Payer: Self-pay

## 2019-01-16 ENCOUNTER — Other Ambulatory Visit: Payer: Self-pay

## 2019-01-16 ENCOUNTER — Ambulatory Visit (HOSPITAL_COMMUNITY)
Admission: RE | Admit: 2019-01-16 | Discharge: 2019-01-16 | Disposition: A | Discharge status: Home / Self Care | Payer: BC Managed Care – PPO | Source: Ambulatory Visit | Attending: Oncology | Admitting: Oncology

## 2019-01-16 DIAGNOSIS — Z9221 Personal history of antineoplastic chemotherapy: Secondary | ICD-10-CM | POA: Diagnosis not present

## 2019-01-16 DIAGNOSIS — I10 Essential (primary) hypertension: Secondary | ICD-10-CM | POA: Insufficient documentation

## 2019-01-16 DIAGNOSIS — Z170 Estrogen receptor positive status [ER+]: Secondary | ICD-10-CM

## 2019-01-16 DIAGNOSIS — R011 Cardiac murmur, unspecified: Secondary | ICD-10-CM | POA: Diagnosis not present

## 2019-01-16 DIAGNOSIS — Z452 Encounter for adjustment and management of vascular access device: Secondary | ICD-10-CM | POA: Diagnosis not present

## 2019-01-16 DIAGNOSIS — Z79899 Other long term (current) drug therapy: Secondary | ICD-10-CM | POA: Diagnosis not present

## 2019-01-16 DIAGNOSIS — C50411 Malignant neoplasm of upper-outer quadrant of right female breast: Secondary | ICD-10-CM

## 2019-01-16 DIAGNOSIS — C50911 Malignant neoplasm of unspecified site of right female breast: Secondary | ICD-10-CM | POA: Insufficient documentation

## 2019-01-16 DIAGNOSIS — Z95828 Presence of other vascular implants and grafts: Secondary | ICD-10-CM

## 2019-01-16 DIAGNOSIS — Z923 Personal history of irradiation: Secondary | ICD-10-CM | POA: Diagnosis not present

## 2019-01-16 HISTORY — PX: IR REMOVAL TUN ACCESS W/ PORT W/O FL MOD SED: IMG2290

## 2019-01-16 LAB — CBC WITH DIFFERENTIAL/PLATELET
Abs Immature Granulocytes: 0.01 10*3/uL (ref 0.00–0.07)
Basophils Absolute: 0 10*3/uL (ref 0.0–0.1)
Basophils Relative: 1 %
Eosinophils Absolute: 0.3 10*3/uL (ref 0.0–0.5)
Eosinophils Relative: 4 %
HCT: 39.8 % (ref 36.0–46.0)
Hemoglobin: 12.8 g/dL (ref 12.0–15.0)
IMMATURE GRANULOCYTES: 0 %
Lymphocytes Relative: 20 %
Lymphs Abs: 1.2 10*3/uL (ref 0.7–4.0)
MCH: 30.5 pg (ref 26.0–34.0)
MCHC: 32.2 g/dL (ref 30.0–36.0)
MCV: 94.8 fL (ref 80.0–100.0)
Monocytes Absolute: 0.5 10*3/uL (ref 0.1–1.0)
Monocytes Relative: 9 %
NEUTROS PCT: 66 %
NRBC: 0 % (ref 0.0–0.2)
Neutro Abs: 4.1 10*3/uL (ref 1.7–7.7)
Platelets: 253 10*3/uL (ref 150–400)
RBC: 4.2 MIL/uL (ref 3.87–5.11)
RDW: 12.8 % (ref 11.5–15.5)
WBC: 6.1 10*3/uL (ref 4.0–10.5)

## 2019-01-16 LAB — PROTIME-INR
INR: 1.05
PROTHROMBIN TIME: 13.6 s (ref 11.4–15.2)

## 2019-01-16 MED ORDER — CEFAZOLIN SODIUM-DEXTROSE 2-4 GM/100ML-% IV SOLN
INTRAVENOUS | Status: AC
  Administered 2019-01-16: 2 g via INTRAVENOUS
  Filled 2019-01-16: qty 100

## 2019-01-16 MED ORDER — LIDOCAINE-EPINEPHRINE (PF) 2 %-1:200000 IJ SOLN
INTRAMUSCULAR | Status: AC
  Filled 2019-01-16: qty 20

## 2019-01-16 MED ORDER — SODIUM CHLORIDE 0.9 % IV SOLN
INTRAVENOUS | Status: DC
  Administered 2019-01-16: 14:00:00 via INTRAVENOUS

## 2019-01-16 MED ORDER — LIDOCAINE-EPINEPHRINE (PF) 1 %-1:200000 IJ SOLN
INTRAMUSCULAR | Status: AC | PRN
  Administered 2019-01-16: 10 mL

## 2019-01-16 MED ORDER — FENTANYL CITRATE (PF) 100 MCG/2ML IJ SOLN
INTRAMUSCULAR | Status: AC | PRN
  Administered 2019-01-16: 25 ug via INTRAVENOUS

## 2019-01-16 MED ORDER — MIDAZOLAM HCL 2 MG/2ML IJ SOLN
INTRAMUSCULAR | Status: AC
  Filled 2019-01-16: qty 4

## 2019-01-16 MED ORDER — CEFAZOLIN SODIUM-DEXTROSE 2-4 GM/100ML-% IV SOLN
2.0000 g | INTRAVENOUS | Status: AC
  Administered 2019-01-16: 2 g via INTRAVENOUS

## 2019-01-16 MED ORDER — MIDAZOLAM HCL 2 MG/2ML IJ SOLN
INTRAMUSCULAR | Status: AC | PRN
  Administered 2019-01-16: 1 mg via INTRAVENOUS
  Administered 2019-01-16: 0.5 mg via INTRAVENOUS

## 2019-01-16 MED ORDER — FENTANYL CITRATE (PF) 100 MCG/2ML IJ SOLN
INTRAMUSCULAR | Status: AC
  Filled 2019-01-16: qty 2

## 2019-01-16 NOTE — Procedures (Signed)
  Procedure: R Port removal   EBL:   minimal Complications:  none immediate  See full dictation in [facility].  Dillard Cannon MD Main # [phone] Pager  [phone]

## 2019-01-16 NOTE — Discharge Instructions (Addendum)
Implanted Port Removal, Care After °This sheet gives you information about how to care for yourself after your procedure. Your health care provider may also give you more specific instructions. If you have problems or questions, contact your health care provider. °What can I expect after the procedure? °After the procedure, it is common to have: °· Soreness or pain near your incision. °· Some swelling or bruising near your incision. °Follow these instructions at home: °Medicines °· Take over-the-counter and prescription medicines only as told by your health care provider. °· If you were prescribed an antibiotic medicine, take it as told by your health care provider. Do not stop taking the antibiotic even if you start to feel better. °Bathing °· Do not take baths, swim, or use a hot tub until your health care provider approves. Ask your health care provider if you can take showers. You may only be allowed to take sponge baths. °Incision care ° °· Follow instructions from your health care provider about how to take care of your incision. Make sure you: °? Wash your hands with soap and water before you change your bandage (dressing). If soap and water are not available, use hand sanitizer. °? Change your dressing as told by your health care provider. °? Keep your dressing dry. °? Leave stitches (sutures), skin glue, or adhesive strips in place. These skin closures may need to stay in place for 2 weeks or longer. If adhesive strip edges start to loosen and curl up, you may trim the loose edges. Do not remove adhesive strips completely unless your health care provider tells you to do that. °· Check your incision area every day for signs of infection. Check for: °? More redness, swelling, or pain. °? More fluid or blood. °? Warmth. °? Pus or a bad smell. °Driving ° °· Do not drive for 24 hours if you were given a medicine to help you relax (sedative) during your procedure. °· If you did not receive a sedative, ask your  health care provider when it is safe to drive. °Activity °· Return to your normal activities as told by your health care provider. Ask your health care provider what activities are safe for you. °· Do not lift anything that is heavier than 10 lb (4.5 kg), or the limit that you are told, until your health care provider says that it is safe. °· Do not do activities that involve lifting your arms over your head. °General instructions °· Do not use any products that contain nicotine or tobacco, such as cigarettes and e-cigarettes. These can delay healing. If you need help quitting, ask your health care provider. °· Keep all follow-up visits as told by your health care provider. This is important. °Contact a health care provider if: °· You have more redness, swelling, or pain around your incision. °· You have more fluid or blood coming from your incision. °· Your incision feels warm to the touch. °· You have pus or a bad smell coming from your incision. °· You have pain that is not relieved by your pain medicine. °Get help right away if you have: °· A fever or chills. °· Chest pain. °· Difficulty breathing. °Summary °· After the procedure, it is common to have pain, soreness, swelling, or bruising near your incision. °· If you were prescribed an antibiotic medicine, take it as told by your health care provider. Do not stop taking the antibiotic even if you start to feel better. °· Do not drive for 24 hours   if you were given a sedative during your procedure. °· Return to your normal activities as told by your health care provider. Ask your health care provider what activities are safe for you. °This information is not intended to replace advice given to you by your health care provider. Make sure you discuss any questions you have with your health care provider. °Document Released: 11/08/2015 Document Revised: 01/10/2018 Document Reviewed: 01/10/2018 °Elsevier Interactive Patient Education © 2019 Elsevier  Inc. ° ° °Moderate Conscious Sedation, Adult, Care After °These instructions provide you with information about caring for yourself after your procedure. Your health care provider may also give you more specific instructions. Your treatment has been planned according to current medical practices, but problems sometimes occur. Call your health care provider if you have any problems or questions after your procedure. °What can I expect after the procedure? °After your procedure, it is common: °· To feel sleepy for several hours. °· To feel clumsy and have poor balance for several hours. °· To have poor judgment for several hours. °· To vomit if you eat too soon. °Follow these instructions at home: °For at least 24 hours after the procedure: ° °· Do not: °? Participate in activities where you could fall or become injured. °? Drive. °? Use heavy machinery. °? Drink alcohol. °? Take sleeping pills or medicines that cause drowsiness. °? Make important decisions or sign legal documents. °? Take care of children on your own. °· Rest. °Eating and drinking °· Follow the diet recommended by your health care provider. °· If you vomit: °? Drink water, juice, or soup when you can drink without vomiting. °? Make sure you have little or no nausea before eating solid foods. °General instructions °· Have a responsible adult stay with you until you are awake and alert. °· Take over-the-counter and prescription medicines only as told by your health care provider. °· If you smoke, do not smoke without supervision. °· Keep all follow-up visits as told by your health care provider. This is important. °Contact a health care provider if: °· You keep feeling nauseous or you keep vomiting. °· You feel light-headed. °· You develop a rash. °· You have a fever. °Get help right away if: °· You have trouble breathing. °This information is not intended to replace advice given to you by your health care provider. Make sure you discuss any questions  you have with your health care provider. °Document Released: 09/17/2013 Document Revised: 05/01/2016 Document Reviewed: 03/18/2016 °Elsevier Interactive Patient Education © 2019 Elsevier Inc. ° °

## 2019-01-16 NOTE — H&P (Signed)
Chief Complaint: Patient was seen in consultation today for breast cancer.  Referring Physician(s): Chauncey Cruel  Supervising Physician: Arne Cleveland  Patient Status: [facility] - Out-pt  History of Present Illness: Anna Conrad is a 52 y.o. female with a past medical history of hypertension, heart murmur, and breast cancer. She was unfortunately diagnosed with breast cancer in 01/2018. Her cancer is managed by Dr. Jana Hakim. She had a Port-a-cath placed 05/27/2018 by Dr. Ninfa Linden. This port was malpositioned, so she had it removed and a new Port-a-cath placed 09/04/2018 in IR by Dr. Anselm Pancoast. She then underwent a course of chemotherapy 06/04/2018 to 07/23/2018 and another course 08/09/2018 to 09/13/2018. Following this, she completed a course of radiation. She no longer needs her Port-a-cath per Dr. Jana Hakim.  IR requested by Dr. Jana Hakim for possible Port-a-cath removal. Patient awake and alert sitting in bed with no complaints at this time. Denies fever, chills, chest pain, dyspnea, abdominal pain, dizziness, or headache.   Past Medical History:  Diagnosis Date  . Blood dyscrasia    hx blood clott superficial in rt arm  . Cancer [facility]) 2019   right breast cancer  . Family history of breast cancer   . Heart murmur   . Hypertension     Past Surgical History:  Procedure Laterality Date  . BREAST LUMPECTOMY WITH RADIOACTIVE SEED AND SENTINEL LYMPH NODE BIOPSY Right 03/18/2018   Procedure: RIGHT BREAST PARTIAL MASTECTOMY WITH RADIOACTIVE SEED AND SENTINEL LYMPH NODE BIOPSY;  Surgeon: [name], MD;  Location: [place];  Service: General;  Laterality: Right;  . cesearean section     tubal ligation   . IR CV LINE INJECTION  08/28/2018  . IR IMAGING GUIDED PORT INSERTION  09/04/2018  . IR REMOVAL TUN ACCESS W/ PORT W/O FL MOD SED  09/04/2018  . PORTACATH PLACEMENT Left 05/27/2018   Procedure: INSERTION PORT-A-CATH;  Surgeon: [name], MD;  Location: [place];  Service: General;  Laterality: Left;  . TUBAL LIGATION      Allergies: Adhesive [tape] and Latex  Medications: Prior to Admission medications   Medication Sig Start Date End Date Taking? Authorizing Provider  carvedilol (COREG) 3.125 MG tablet Take 1 tablet (3.125 mg total) by mouth 2 (two) times daily with a meal. 12/05/18   Bensimhon, Shaune Pascal, MD  lisinopril-hydrochlorothiazide (PRINZIDE,ZESTORETIC) 10-12.5 MG tablet Take 1 tablet by mouth daily.    [provider]  tamoxifen (NOLVADEX) 20 MG tablet Take 1 tablet (20 mg total) by mouth daily for 30 days. 12/20/18 01/19/19  Magrinat, Virgie Dad, MD     Family History  Problem Relation Age of Onset  . Breast cancer Sister 74       approximate  . Cancer Father 63       started in shoulder, spread to spine, brain, lung  . Cancer Paternal [name]        'blood cancer'  . Heart attack Paternal Uncle   . Breast cancer Cousin        age dx unk  . Breast cancer Cousin        age dx unk  . Breast cancer Cousin        age dx unk    Social History   Socioeconomic History  . Marital status: Married    Spouse name: Not on file  . Number of children: Not on file  . Years of education: Not on file  . Highest education level: Not on file  Occupational History  .  Not on file  Social Needs  . Financial resource strain: Not on file  . Food insecurity:    Worry: Not on file    Inability: Not on file  . Transportation needs:    Medical: No    Non-medical: No  Tobacco Use  . Smoking status: Never Smoker  . Smokeless tobacco: Never Used  Substance and Sexual Activity  . Alcohol use: Yes    Comment: occ  . Drug use: Never  . Sexual activity: Yes    Birth control/protection: Surgical    Comment: BTL  Lifestyle  . Physical activity:    Days per week: Not on file    Minutes per session: Not on file  . Stress: Not on file  Relationships  . Social connections:    Talks on phone: Not on file    Gets together: Not on  file    Attends religious service: Not on file    Active member of club or organization: Not on file    Attends meetings of clubs or organizations: Not on file    Relationship status: Not on file  Other Topics Concern  . Not on file  Social History Narrative  . Not on file     Review of Systems: A 12 point ROS discussed and pertinent positives are indicated in the HPI above.  All other systems are negative.  Review of Systems  Constitutional: Negative for chills and fever.  Respiratory: Negative for shortness of breath and wheezing.   Cardiovascular: Negative for chest pain and palpitations.  Gastrointestinal: Negative for abdominal pain.  Neurological: Negative for dizziness and headaches.  Psychiatric/Behavioral: Negative for behavioral problems.    Vital Signs: BP 105/69 (BP Location: Right Arm)   Pulse 75   Temp 98.1 F (36.7 C) (Oral)   Resp 16   SpO2 97%   Physical Exam Vitals signs and nursing note reviewed.  Constitutional:      General: She is not in acute distress.    Appearance: Normal appearance.  Cardiovascular:     Rate and Rhythm: Normal rate and regular rhythm.     Heart sounds: Normal heart sounds. No murmur.  Pulmonary:     Effort: Pulmonary effort is normal. No respiratory distress.     Breath sounds: Normal breath sounds. No wheezing.  Skin:    General: Skin is warm and dry.  Neurological:     Mental Status: She is alert and oriented to person, place, and time.  Psychiatric:        Mood and Affect: Mood normal.        Behavior: Behavior normal.        Thought Content: Thought content normal.        Judgment: Judgment normal.      MD Evaluation Airway: WNL Heart: WNL Abdomen: WNL Chest/ Lungs: WNL ASA  Classification: 2 Mallampati/Airway Score: One   Imaging: No results found.  Labs:  CBC: Recent Labs    09/13/18 0954 09/20/18 1030 09/27/18 1040 12/20/18 1312  WBC 3.6* 3.3* 4.9 5.6  HGB 9.4* 10.2* 10.8* 12.1  HCT 27.7*  29.6* 32.2* 35.8*  PLT 320 262 280 255    COAGS: Recent Labs    09/04/18 1313  INR 0.94  APTT 26    BMP: Recent Labs    09/13/18 0954 09/20/18 1030 09/27/18 1040 12/20/18 1312  NA 141 140 141 141  K 3.3* 3.7 3.7 3.7  CL 105 104 104 104  CO2 27 26 29  29  GLUCOSE 102* 87 91 101*  BUN 12 12 15 19   CALCIUM 9.1 9.7 9.6 9.2  CREATININE 0.83 0.84 0.87 0.95  GFRNONAA >60 >60 >60 >60  GFRAA >60 >60 >60 >60    LIVER FUNCTION TESTS: Recent Labs    09/13/18 0954 09/20/18 1030 09/27/18 1040 12/20/18 1312  BILITOT 0.5 0.6 0.6 0.4  AST 26 25 22 26   ALT 65* 30 21 37  ALKPHOS 46 52 52 57  PROT 6.5 6.8 6.8 6.9  ALBUMIN 4.0 4.1 3.9 4.0    Assessment and Plan:  Breast cancer s/p chemotherapy. Plan for Port-a-cath removal today with Dr. Vernard Gambles. Patient is NPO. Afebrile and WBCs WNL. She does not take blood thinners. INR 1.05 seconds today.  Risks and benefits of port-a-catheter removal was discussed with the patient including, but not limited to bleeding, infection, or need for additional procedures. All of the patient's questions were answered, patient is agreeable to proceed. Consent signed and in chart.   Thank you for this interesting consult.  I greatly enjoyed meeting Anna Conrad and look forward to participating in their care.  A copy of this report was sent to the requesting provider on this date.  Electronically Signed: Earley Abide, PA-C 01/16/2019, 1:42 PM   I spent a total of 40 Minutes in face to face in clinical consultation, greater than 50% of which was counseling/coordinating care for breast cancer.

## 2019-01-29 IMAGING — US IR FLUORO GUIDE CV LINE*L*
1 series · 1 of 1 positions shown · non-contrast
Comparison: none

CLINICAL DATA: History of breast carcinoma. Left subclavian
Port-A-Cath placed surgically on 05/27/2018 is now malpositioned
with catheter tip oriented transversely in the brachiocephalic vein
and along the lateral wall of the upper SVC. [REDACTED] placement
required for further chemotherapy.

[Series 1: ir fluoro/shunt/fist · 1 of 1 slices shown]
[im 1/1]
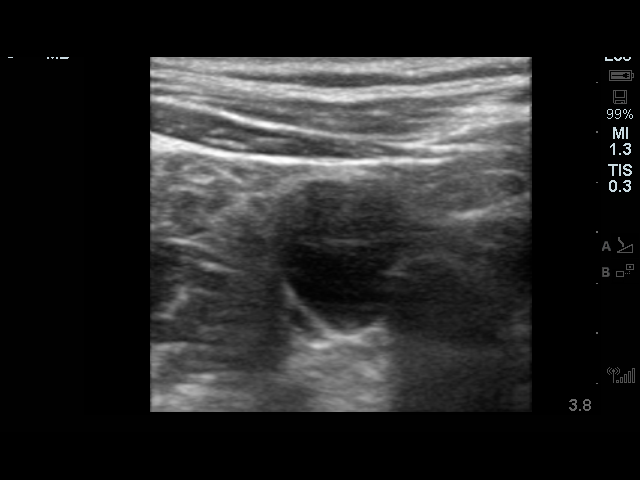

[1 of 1 positions shown; findings below may reference images not displayed]

EXAM:
1. IMPLANTED PORT A CATH PLACEMENT WITH ULTRASOUND AND FLUOROSCOPIC
GUIDANCE
2. REMOVAL OF IMPLANTED LEFT CHEST TUNNELED CENTRAL VENOUS
PORT-A-CATH

ANESTHESIA/SEDATION:
4.0 mg IV Versed; 100 mcg IV Fentanyl

Total Moderate Sedation Time:  68 minutes

The patient's level of consciousness and physiologic status were
continuously monitored during the procedure by Radiology nursing.

Additional Medications: 2 g IV Ancef.

FLUOROSCOPY TIME:  24 seconds.  5.0 mGy.

PROCEDURE:
The procedure, risks, benefits, and alternatives were explained to
the patient. Questions regarding the procedure were encouraged and
answered. The patient understands and consents to the procedure. A
time-out was performed prior to initiating the procedure.

Ultrasound was utilized to confirm patency of the right internal
jugular vein. The right neck and chest were prepped with
chlorhexidine in a sterile fashion, and a sterile drape was applied
covering the operative field. Maximum barrier sterile technique with
sterile gowns and gloves were used for the procedure. Local
anesthesia was provided with 1% lidocaine.

After creating a small venotomy incision, a 21 gauge needle was
advanced into the right internal jugular vein under direct,
real-time ultrasound guidance. Ultrasound image documentation was
performed. After securing guidewire access, an 8 Fr dilator was
placed. A J-wire was kinked to measure appropriate catheter length.

A subcutaneous port pocket was then created along the upper chest
wall utilizing sharp and blunt dissection. Portable cautery was
utilized. The pocket was irrigated with sterile saline.

A single lumen power injectable port was chosen for placement. The 8
Fr catheter was tunneled from the port pocket site to the venotomy
incision. The port was placed in the pocket. External catheter was
trimmed to appropriate length based on guidewire measurement.

At the venotomy, an 8 Fr peel-away sheath was placed over a
guidewire. The catheter was then placed through the sheath and the
sheath removed. Final catheter positioning was confirmed and
documented with a fluoroscopic spot image. The port was accessed
with a needle and aspirated and flushed with heparinized saline. The
access needle was removed.

The venotomy and port pocket incisions were closed with subcutaneous
3-0 Monocryl and subcuticular 4-0 Vicryl. Dermabond was applied to
both incisions.

The skin overlying the left chest port reservoir was then prepped
with chlorhexidine. Local anesthesia was provided with 1% lidocaine.
A skin incision was made with a #15 scalpel. Utilizing sharp and
blunt dissection, the indwelling left chest subclavian Port-A-Cath
was removed. Portable cautery was utilized. The pocket was irrigated
with sterile saline. The incision was closed with subcutaneous 3-0
Monocryl and subcuticular 4-0 Vicryl. Dermabond was applied to the
incision.

COMPLICATIONS:
COMPLICATIONS
None
FINDINGS: After [REDACTED] placement via the right internal jugular vein, the
tip lies at the [place] junction. The catheter aspirates
normally and is ready for immediate use.

The left chest port was removed in its entirety without difficulty.
IMPRESSION: 1. Placement of single lumen port a cath via right internal jugular
vein. The catheter tip lies at the [place] junction. A power
injectable port a cath was placed and is ready for immediate use.
2. Removal of malpositioned left chest port utilizing sharp and
blunt dissection.

## 2019-02-10 ENCOUNTER — Ambulatory Visit (HOSPITAL_COMMUNITY)
Admission: RE | Admit: 2019-02-10 | Discharge: 2019-02-10 | Disposition: A | Discharge status: Home / Self Care | Payer: BC Managed Care – PPO | Source: Ambulatory Visit | Attending: Internal Medicine | Admitting: Internal Medicine

## 2019-02-10 ENCOUNTER — Encounter (HOSPITAL_COMMUNITY): Payer: Self-pay | Admitting: Internal Medicine

## 2019-02-10 ENCOUNTER — Ambulatory Visit (HOSPITAL_BASED_OUTPATIENT_CLINIC_OR_DEPARTMENT_OTHER)
Admission: RE | Admit: 2019-02-10 | Discharge: 2019-02-10 | Disposition: A | Discharge status: Home / Self Care | Payer: BC Managed Care – PPO | Source: Ambulatory Visit | Attending: Internal Medicine | Admitting: Internal Medicine

## 2019-02-10 VITALS — BP 118/76 | HR 75 | Wt 192.2 lb

## 2019-02-10 DIAGNOSIS — Z170 Estrogen receptor positive status [ER+]: Secondary | ICD-10-CM

## 2019-02-10 DIAGNOSIS — I10 Essential (primary) hypertension: Secondary | ICD-10-CM | POA: Insufficient documentation

## 2019-02-10 DIAGNOSIS — C50411 Malignant neoplasm of upper-outer quadrant of right female breast: Secondary | ICD-10-CM

## 2019-02-10 DIAGNOSIS — R011 Cardiac murmur, unspecified: Secondary | ICD-10-CM | POA: Insufficient documentation

## 2019-02-10 NOTE — Progress Notes (Signed)
  Echocardiogram 2D Echocardiogram has been performed.  Bretta Fees G Eeva Schlosser 02/10/2019, 3:08 PM

## 2019-02-10 NOTE — Progress Notes (Signed)
CARDIO-ONCOLOGY CLINIC CONSULT NOTE  Referring Physician: Magrinat Primary Cardiologist: Bensimhon (new)  HPI:  Anna Conrad is 53 y.o. female with right breast cancer referred by Dr. Jana Hakim for enrollment into the Cardio-Oncology program.  [name] had routine screening mammography on 01/29/2018 showing a possible mass with associated calcifications in the right breast. She underwent unilateral right diagnostic mammography with tomography and right breast ultrasonography at [facility] on 02/05/2018 showing a highly suspicious mass over the 10:30 position upper outer of the right breast located 6 cm from the nipple measuring 1.3 x 1.4 x 1.5 cm. Associated microcalcifications corresponding to the mammographic abnormality. Ultrasound of the right axilla was normal.  Accordingly on 02/29/2019 she proceeded to biopsy of the right breast area in question. 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 Prognostic indicators significant for: estrogen receptor, 100% positive with strong staining intensity and progesterone receptor, 20% positive with moderate staining intensity. Proliferation marker Ki67 at 10%. HER2 not amplified with ratios HER2/CEP17 signals 1.17 and average HER2 copies per cell 2.45.  Lumpectomy in 3/19  Completed chemo with 4 cycles of Doxoribucin and Cyclophosphamide in 10/19. Started weekly paclitaxel on 09/20/2018 being day 1 cycle 7. However that treatment was held due to peripheral neuropathy.   Just finished 30 treatments of XRT.   She returns for cardio-oncology follow up. Last visit coreg was added and echo with question of possible adriamycin toxicity with EF 50-55%. Overall doing well. Appetite and energy level fine. No dizziness. No SOB. No orthopnea, edema, or PND. Taking all medications.   Echo today EF 60-65% Personally reviewed  Echo 6/19 EF 60-65% GLS -23%  Echo (12/05/18) EF  50-55%  GLS -14.8%  Review of systems complete and found to be negative unless listed in HPI.   Past Medical History:  Diagnosis Date  . Blood dyscrasia    hx blood clott superficial in rt arm  . Cancer [facility]) 2019   right breast cancer  . Family history of breast cancer   . Heart murmur   . Hypertension     Current Outpatient Medications  Medication Sig Dispense Refill  . carvedilol (COREG) 3.125 MG tablet Take 1 tablet (3.125 mg total) by mouth 2 (two) times daily with a meal. 60 tablet 6  . lisinopril-hydrochlorothiazide (PRINZIDE,ZESTORETIC) 10-12.5 MG tablet Take 1 tablet by mouth daily.     No current facility-administered medications for this encounter.     Allergies  Allergen Reactions  . Adhesive [Tape] Rash  . Latex Rash    Red  rash      Social History   Socioeconomic History  . Marital status: Married    Spouse name: Not on file  . Number of children: Not on file  . Years of education: Not on file  . Highest education level: Not on file  Occupational History  . Not on file  Social Needs  . Financial resource strain: Not on file  . Food insecurity:    Worry: Not on file    Inability: Not on file  . Transportation needs:    Medical: No    Non-medical: No  Tobacco Use  . Smoking status: Never Smoker  . Smokeless tobacco: Never Used  Substance and Sexual Activity  . Alcohol use: Yes    Comment: occ  . Drug use: Never  . Sexual activity: Yes    Birth control/protection: Surgical    Comment: BTL  Lifestyle  . Physical  activity:    Days per week: Not on file    Minutes per session: Not on file  . Stress: Not on file  Relationships  . Social connections:    Talks on phone: Not on file    Gets together: Not on file    Attends religious service: Not on file    Active member of club or organization: Not on file    Attends meetings of clubs or organizations: Not on file    Relationship status: Not on file  . Intimate partner violence:    Fear  of current or ex partner: No    Emotionally abused: No    Physically abused: No    Forced sexual activity: No  Other Topics Concern  . Not on file  Social History Narrative  . Not on file      Family History  Problem Relation Age of Onset  . Breast cancer Sister 16       approximate  . Cancer Father 55       started in shoulder, spread to spine, brain, lung  . Cancer Paternal [name]        'blood cancer'  . Heart attack Paternal Uncle   . Breast cancer Cousin        age dx unk  . Breast cancer Cousin        age dx unk  . Breast cancer Cousin        age dx unk    Vitals:   02/10/19 1501  BP: 118/76  Pulse: 75  SpO2: 97%  Weight: 87.2 kg (192 lb 3.2 oz)    PHYSICAL EXAM: General:  Well appearing. No resp difficulty HEENT: normal Neck: supple. no JVD. Carotids 2+ bilat; no bruits. No lymphadenopathy or thryomegaly appreciated. Cor: PMI nondisplaced. Regular rate & rhythm. No rubs, gallops or murmurs. Lungs: clear Abdomen: soft, nontender, nondistended. No hepatosplenomegaly. No bruits or masses. Good bowel sounds. Extremities: no cyanosis, clubbing, rash, edema Neuro: alert & orientedx3, cranial nerves grossly intact. moves all 4 extremities w/o difficulty. Affect pleasant   ASSESSMENT & PLAN:  1. Right Breast Cancer - Diagnosed 3/19. ER + PR - HER2 - - s/p Lumpectomy in 3/19 - Completed chemo with 4 cycles of Doxoribucin and Cyclophosphamide in 10/19. Started weekly paclitaxel on 09/20/2018 being day 1 cycle 7. However that treatment was held due to peripheral neuropathy.  - Completed XRT 12/19  - Echo 7/19 EF 60-65% GLS -23% - Echo 12/19 EF 50-55% GLS -14.8 - Echo today EF 60-65%  - Continue Lisinopril 10/HCTZ 12.5. Continue carvedilol 3.125 bid   2. HTN - Stable  Echo reviewed personally. EF completely normal. No evidence of cardiotoxicity. Can consider repeat echo in 1 year to ensure stability given previous treatment with Adriamycin.   Glori Bickers, MD  5:24 PM

## 2019-02-10 NOTE — Patient Instructions (Signed)
Follow up as needed

## 2019-03-06 ENCOUNTER — Telehealth: Payer: Self-pay | Admitting: Oncology

## 2019-03-06 NOTE — Telephone Encounter (Signed)
Changed apt to home visit per 3/25 sch message - left message for patient. Not to come in and she will be called for her visit.

## 2019-03-12 NOTE — Progress Notes (Signed)
[place]  [phone] [phone]     ID: Anna Conrad DOB: Jul 14, 1966  MR#: [id number]  DVV#:[id number]  Patient Care Team: Filiberto Pinks as PCP - General (Physician Assistant) Cormick Moss, Virgie Dad, MD as Consulting Physician (Oncology) Kyung Rudd, MD as Consulting Physician (Radiation Oncology) [name], MD as Consulting Physician (General Surgery) Janyth Pupa, DO as Consulting Physician (Obstetrics and Gynecology) Janyth Pupa, DO as Consulting Physician (Obstetrics and Gynecology) OTHER MD:  This is a telehealth visit taking place on 03/13/19 at 2:21 PM   with the patient Anna Conrad  located at her home and agreeing to the visit, and myself as the provider located at the clinic.    CHIEF COMPLAINT: Estrogen receptor positive breast cancer   CURRENT TREATMENT: Tamoxifen  INTERVAL HISTORY: Anna Conrad returns today for follow-up and treatment of her estrogen receptor positive breast cancer.   In January of this year she started on tamoxifen, right after her last visit.  She is tolerating it remarkably well.  She has occasional hot flashes but really not more frequent or intense than before.  She has not had any vaginal discharge or any other side effects that she is aware of.  She is obtaining the drug at a very good price.  Since her last visit here she had an echocardiogram on 02/10/2019 which showed an ejection fraction in the 60-65% range, with normal left ventricular parameters  She had her port removed 01/16/2019.  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  For exercise she walks in the forearm and of course she does housework.  The only complaint she has is that she has a little  bit of pain in the breast and armpit, and medial aspect of the arm of the on the surgical site.  She finds that stretching helps.  Aside from that a detailed review of systems today was stable   HISTORY OF CURRENT ILLNESS: From the original intake note:  Anna Conrad had routine screening mammography on 01/29/2018 showing a possible mass with associated calcifications in the right breast. She underwent unilateral right diagnostic mammography with tomography and right breast ultrasonography at [facility] on 02/05/2018 showing a highly suspicious mass over the 10:30 position upper outer of the right breast located 6 cm from the nipple measuring 1.3 x 1.4 x 1.5 cm. Associated microcalcifications corresponding to the mammographic abnormality. Ultrasound of the right axilla was normal.  Accordingly on 02/29/2019 she proceeded to biopsy of the right breast area in question. 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 Prognostic indicators significant for: estrogen receptor, 100% positive with strong staining intensity and progesterone receptor, 20% positive iwht moderate staining intensity. Proliferation marker Ki67 at 10%. HER2 not amplified with ratios HER2/CEP17 signals 1.17 and average HER2 copies per cell 2.45.  On 02/21/2018 the patient had bilateral breast MRI.  This measured the upper outer quadrant right breast mass at 2.3 cm including an area of surrounding non-masslike enhancement.  There was no evidence of multifocal or multicentric disease, no lymphadenopathy, and no findings in the left breast.  The patient's subsequent history is as detailed below.    PAST MEDICAL HISTORY: Past Medical  History:  Diagnosis Date   Blood dyscrasia    hx blood clott superficial in rt arm   Cancer ([place]) 2019   right breast cancer   Family history of breast cancer    Heart murmur    Hypertension   She used to have  migraines. She denies GERD, asthma, emphysema. She was told that she had a slight heart murmur. She denies heart palpitations.   PAST SURGICAL HISTORY: Past Surgical History:  Procedure Laterality Date   BREAST LUMPECTOMY WITH RADIOACTIVE SEED AND SENTINEL LYMPH NODE BIOPSY Right 03/18/2018   Procedure: RIGHT BREAST PARTIAL MASTECTOMY WITH RADIOACTIVE SEED AND SENTINEL LYMPH NODE BIOPSY;  Surgeon: [name], MD;  Location: [facility];  Service: General;  Laterality: Right;   cesearean section     tubal ligation    IR CV LINE INJECTION  08/28/2018   IR IMAGING GUIDED PORT INSERTION  09/04/2018   IR REMOVAL TUN ACCESS W/ PORT W/O FL MOD SED  09/04/2018   IR REMOVAL TUN ACCESS W/ PORT W/O FL MOD SED  01/16/2019   PORTACATH PLACEMENT Left 05/27/2018   Procedure: INSERTION PORT-A-CATH;  Surgeon: [name], MD;  Location: [facility];  Service: General;  Laterality: Left;   TUBAL LIGATION      2 Caesarian sections- tubal ligation at the second c-section.  FAMILY HISTORY Family History  Problem Relation Age of Onset   Breast cancer Sister 53       approximate   Cancer Father 72       started in shoulder, spread to spine, brain, lung   Cancer Paternal Uncle        'blood cancer'   Heart attack Paternal Uncle    Breast cancer Cousin        age dx unk   Breast cancer Cousin        age dx unk   Breast cancer Cousin        age dx unk   The patient's father passed away in the summer 2018 at the age of 74 due to metastatic lung cancer. He was a previous smoker. The patient's mother is alive at age 52. The patient has 1 brother and 1 sister. The patient's sister was diagnosed with breast cancer at the age of 21.  The patient has 3 paternal cousins diagnosed with breast cancer. She denies a family history of ovarian cancer.    GYNECOLOGIC HISTORY:  The patient's last menstrual period was in 05/2018 (days before chemotherapy started). Menarche: 53 years old.  She used to be on a dance team.  Age at first live birth: 53 years old She is [place] P2. She was taking oral contraceptives, but this was discontinued as of January 2019.  She had bilateral tubal ligation at the time of her second c-section.    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  She has a PhD.  Her husband, [name], is a Chief Strategy Officer for [employer], making metal parts. He travels about 2-3 times per year. Their sons are [name] age 62 and [name] age 24. She attends [employer].     ADVANCED DIRECTIVES: In the absence of any documents to the contrary the patient's husband is automatically her healthcare power of attorney   HEALTH MAINTENANCE: Social History   Tobacco Use   Smoking status: Never Smoker   Smokeless tobacco: Never Used  Substance Use Topics   Alcohol use: Yes    Comment: occ  Drug use: Never     Colonoscopy:n/a  PAP: 2017/ normal  Bone density: n/a   Allergies  Allergen Reactions   Adhesive [Tape] Rash   Latex Rash    Red  rash    Current Outpatient Medications  Medication Sig Dispense Refill   carvedilol (COREG) 3.125 MG tablet Take 1 tablet (3.125 mg total) by mouth 2 (two) times daily with a meal. 60 tablet 6   lisinopril-hydrochlorothiazide (PRINZIDE,ZESTORETIC) 10-12.5 MG tablet Take 1 tablet by mouth daily.     No current facility-administered medications for this visit.     OBJECTIVE: Middle-aged white woman who appears well  There were no vitals filed for this visit.   There is no height or weight on file to calculate BMI.   Wt Readings from Last 3 Encounters:  02/10/19 192 lb 3.2 oz (87.2 kg)  12/20/18 189 lb 12.8 oz (86.1 kg)  12/05/18 190 lb (86.2 kg)   ECOG FS:1 - Symptomatic but completely ambulatory  No obvious right upper extremity lymphedema  LAB RESULTS:  CMP     Component Value Date/Time   NA 141 12/20/2018 1312   K 3.7 12/20/2018 1312   CL 104 12/20/2018 1312     CO2 29 12/20/2018 1312   GLUCOSE 101 (H) 12/20/2018 1312   BUN 19 12/20/2018 1312   CREATININE 0.95 12/20/2018 1312   CALCIUM 9.2 12/20/2018 1312   PROT 6.9 12/20/2018 1312   ALBUMIN 4.0 12/20/2018 1312   AST 26 12/20/2018 1312   Conrad 37 12/20/2018 1312   ALKPHOS 57 12/20/2018 1312   BILITOT 0.4 12/20/2018 1312   GFRNONAA >60 12/20/2018 1312   GFRAA >60 12/20/2018 1312    No results found for: TOTALPROTELP, ALBUMINELP, A1GS, A2GS, BETS, BETA2SER, GAMS, MSPIKE, SPEI  No results found for: KPAFRELGTCHN, LAMBDASER, KAPLAMBRATIO  Lab Results  Component Value Date   WBC 6.1 01/16/2019   NEUTROABS 4.1 01/16/2019   HGB 12.8 01/16/2019   HCT 39.8 01/16/2019   MCV 94.8 01/16/2019   PLT 253 01/16/2019    @LASTCHEMISTRY @  No results found for: LABCA2  No components found for: TUUEKC003  No results for input(s): INR in the last 168 hours.  No results found for: LABCA2  No results found for: KJZ791  No results found for: TAV697  No results found for: XYI016  No results found for: CA2729  No components found for: HGQUANT  No results found for: CEA1 / No results found for: CEA1   No results found for: AFPTUMOR  No results found for: CHROMOGRNA  No results found for: PSA1  No visits with results within 3 Day(s) from this visit.  Latest known visit with results is:  Hospital Outpatient Visit on 01/16/2019  Component Date Value Ref Range Status   WBC 01/16/2019 6.1  4.0 - 10.5 K/uL Final   RBC 01/16/2019 4.20  3.87 - 5.11 MIL/uL Final   Hemoglobin 01/16/2019 12.8  12.0 - 15.0 g/dL Final   HCT 01/16/2019 39.8  36.0 - 46.0 % Final   MCV 01/16/2019 94.8  80.0 - 100.0 fL Final   MCH 01/16/2019 30.5  26.0 - 34.0 pg Final   MCHC 01/16/2019 32.2  30.0 - 36.0 g/dL Final   RDW 01/16/2019 12.8  11.5 - 15.5 % Final   Platelets 01/16/2019 253  150 - 400 K/uL Final   nRBC 01/16/2019 0.0  0.0 - 0.2 % Final   Neutrophils Relative % 01/16/2019 66  % Final   Neutro  Abs 01/16/2019 4.1  1.7 - 7.7 K/uL Final   Lymphocytes Relative 01/16/2019 20  % Final   Lymphs Abs 01/16/2019 1.2  0.7 - 4.0 K/uL Final   Monocytes Relative 01/16/2019 9  % Final   Monocytes Absolute 01/16/2019 0.5  0.1 - 1.0 K/uL Final   Eosinophils Relative 01/16/2019 4  % Final   Eosinophils Absolute 01/16/2019 0.3  0.0 - 0.5 K/uL Final   Basophils Relative 01/16/2019 1  % Final   Basophils Absolute 01/16/2019 0.0  0.0 - 0.1 K/uL Final   Immature Granulocytes 01/16/2019 0  % Final   Abs Immature Granulocytes 01/16/2019 0.01  0.00 - 0.07 K/uL Final   Performed at [facility] [place] [address], [place], [place] [address]   Prothrombin Time 01/16/2019 13.6  11.4 - 15.2 seconds Final   INR 01/16/2019 1.05   Final   Performed at [facility] [place], [place] [address]    (this displays the last labs from the last 3 days)  No results found for: TOTALPROTELP, ALBUMINELP, A1GS, A2GS, BETS, BETA2SER, GAMS, MSPIKE, SPEI (this displays SPEP labs)  No results found for: KPAFRELGTCHN, LAMBDASER, KAPLAMBRATIO (kappa/lambda light chains)  No results found for: HGBA, HGBA2QUANT, HGBFQUANT, HGBSQUAN (Hemoglobinopathy evaluation)   No results found for: LDH  No results found for: IRON, TIBC, IRONPCTSAT (Iron and TIBC)  No results found for: FERRITIN  Urinalysis    Component Value Date/Time   COLORURINE YELLOW 06/25/2018 0815   APPEARANCEUR HAZY (A) 06/25/2018 0815   LABSPEC 1.019 06/25/2018 0815   PHURINE 6.0 06/25/2018 0815   GLUCOSEU NEGATIVE 06/25/2018 0815   HGBUR NEGATIVE 06/25/2018 0815   BILIRUBINUR NEGATIVE 06/25/2018 0815   KETONESUR NEGATIVE 06/25/2018 0815   PROTEINUR NEGATIVE 06/25/2018 0815   NITRITE NEGATIVE 06/25/2018 0815   LEUKOCYTESUR NEGATIVE 06/25/2018 0815     STUDIES: Echo results discussed   ELIGIBLE FOR AVAILABLE RESEARCH PROTOCOL: BCEP   ASSESSMENT: 53 y.o. [place], [place] woman status  post right breast upper outer quadrant biopsy 02/07/2018 for a clinical T1c-T2 N0, stage IA-B invasive ductal carcinoma, grade 2, estrogen receptor and progesterone receptor positive, HER-2 not amplified, with an MIB-1 of 10%  (1) status post right lumpectomy 03/18/2018 for a pT1c pN0, stage IA invasive ductal carcinoma, grade 2, estrogen and progesterone receptor positive, with negative margins.  A total of 1 lymph node was removed  (2) Mammaprint on 03/18/2018 showed high risk, indicating that with chemotherapy and hormone therapy, the patient would have a nearly 95% chance of having no distant metastases within the next 5 years  (3) chemotherapy consisting of doxorubicin and cyclophosphamide in dose dense fashion x4 started 06/04/2018, completed 07/23/2018 followed by weekly paclitaxel started 08/09/2018, discontinued 09/13/2018  (a) baseline echocardiogram on 05/23/2018 showed an ejection fraction in the 65-75% range  (b) adjuvant paclitaxel discontinued after 6 cycles because of neuropathy  (4) adjuvant radiation 10/16/2018 - 12/03/2018  Site/dose: The patient initially received a dose of 50.4 Gy in 28 fractions to the right breast using whole-breast tangent fields. This was delivered using a 3-D conformal technique. The patient then received a boost to the seroma. This delivered an additional 10 Gy in 5 fractions using 6x photons with a Complex Isodose technique. The total dose was 60.4 Gy.  (5) tamoxifen started January 2020  (6) genetics testing 03/13/2018 through the Common Hereditary Cancer Panel offered by [facility] ifound no deleterious mutations in APC, ATM, AXIN2, BARD1, BMPR1A, BRCA1, BRCA2, BRIP1, CDH1, CDKN2A (p14ARF), CDKN2A (p16INK4a), CKD4, CHEK2,  CTNNA1, DICER1, EPCAM (Deletion/duplication testing only), GREM1 (promoter region deletion/duplication testing only), KIT, MEN1, MLH1, MSH2, MSH3, MSH6, MUTYH, NBN, NF1, NHTL1, PALB2, PDGFRA, PMS2, POLD1, POLE, PTEN, RAD50, RAD51C, RAD51D,  SDHB, SDHC, SDHD, SMAD4, SMARCA4. STK11, TP53, TSC1, TSC2, and VHL.  The following genes were evaluated for sequence changes only: SDHA and HOXB13 c.251G>A variant only.  (a)  A variant of uncertain significance (VUS) in the gene MSH3 was also identified c.1027+4T>C ([employer]).    PLAN: Niyah is recovering very nicely from her treatments, and she is managing the self-isolation we are all undergoing quite well.  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  She is having no side effects of concern from tamoxifen.  She is also not having any periods.  Her last period was June 2019, just before starting chemotherapy.  Of course tamoxifen is not a contraceptive but she is status post bilateral tubal ligation.  The plan at this point is to continue tamoxifen for at least 2 years and then consider switching to an aromatase inhibitor versus continuing tamoxifen for total of 10 years.  She will see me again in 3 months.  She knows to call for any issues that may develop before that visit.    Maisa Bedingfield, Virgie Dad, MD  03/13/19 2:21 PM Medical Oncology and Hematology [facility] [address] [place], [place] [address] Tel. [phone]    Fax. [phone]

## 2019-03-13 ENCOUNTER — Other Ambulatory Visit: Payer: BC Managed Care – PPO

## 2019-03-13 ENCOUNTER — Inpatient Hospital Stay: Payer: BC Managed Care – PPO | Attending: Oncology | Admitting: Oncology

## 2019-03-13 DIAGNOSIS — C50411 Malignant neoplasm of upper-outer quadrant of right female breast: Secondary | ICD-10-CM

## 2019-03-13 DIAGNOSIS — Z170 Estrogen receptor positive status [ER+]: Secondary | ICD-10-CM | POA: Diagnosis not present

## 2019-03-14 ENCOUNTER — Telehealth: Payer: Self-pay | Admitting: Oncology

## 2019-03-14 NOTE — Telephone Encounter (Signed)
Called regarding 6/23

## 2019-05-27 ENCOUNTER — Encounter: Payer: Self-pay | Admitting: Oncology

## 2019-06-02 NOTE — Progress Notes (Signed)
[place]  Telephone:([phone] Fax:([phone]) [phone]     ID: Anna Conrad DOB: 1965/12/23  MR#: [id number]  FBP#:[id number]  Patient Care Team: Filiberto Pinks as PCP - General (Physician Assistant) Grey Rakestraw, Virgie Dad, MD as Consulting Physician (Oncology) Kyung Rudd, MD as Consulting Physician (Radiation Oncology) [name], MD as Consulting Physician (General Surgery) Janyth Pupa, DO as Consulting Physician (Obstetrics and Gynecology) OTHER MD:   CHIEF COMPLAINT: Estrogen receptor positive breast cancer  CURRENT TREATMENT: Tamoxifen   INTERVAL HISTORY: Anna Conrad returns today for follow-up and treatment of her estrogen receptor positive breast cancer.   She continues on tamoxifen. She has some hot flashes, mostly at night; she has not taken anything to treat. She does not have any vaginal discharge.   Since her last visit here, she has not undergone any additional studies.     REVIEW OF SYSTEMS: Anna Conrad presents with dark curly hair. She says that she has been feeling good. 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 For exercise, she has been trying to get into a more formal routine by walking for 30 minutes every morning. She notes some swelling under her right armpit as compared to the left. She has some back pain. The patient denies unusual headaches, visual changes, nausea, vomiting, or dizziness. There has been no unusual cough, phlegm production, or pleurisy. This been no change in bowel or bladder habits. The patient denies unexplained fatigue or unexplained weight loss, bleeding, rash, or fever. A detailed review of systems was otherwise noncontributory.    HISTORY OF CURRENT ILLNESS: From the original intake note:  Anna Conrad had routine screening mammography on 01/29/2018 showing a possible mass with associated calcifications in the right breast. She  underwent unilateral right diagnostic mammography with tomography and right breast ultrasonography at [facility] on 02/05/2018 showing a highly suspicious mass over the 10:30 position upper outer of the right breast located 6 cm from the nipple measuring 1.3 x 1.4 x 1.5 cm. Associated microcalcifications corresponding to the mammographic abnormality. Ultrasound of the right axilla was normal.  Accordingly on 02/29/2019 she proceeded to biopsy of the right breast area in question. 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 Prognostic indicators significant for: estrogen receptor, 100% positive with strong staining intensity and progesterone receptor, 20% positive iwht moderate staining intensity. Proliferation marker Ki67 at 10%. HER2 not amplified with ratios HER2/CEP17 signals 1.17 and average HER2 copies per cell 2.45.  On 02/21/2018 the patient had bilateral breast MRI.  This measured the upper outer quadrant right breast mass at 2.3 cm including an area of surrounding non-masslike enhancement.  There was no evidence of multifocal or multicentric disease, no lymphadenopathy, and no findings in the left breast.  The patient's subsequent history is as detailed below.   PAST MEDICAL HISTORY: Past Medical History:  Diagnosis Date  . Blood dyscrasia    hx blood clott superficial in rt arm  . Cancer [facility]) 2019   right breast cancer  . Family history of breast cancer   . Heart murmur   . Hypertension   She used to have migraines. She denies GERD, asthma, emphysema. She was told that she had a slight heart murmur. She denies heart palpitations.   PAST SURGICAL HISTORY: Past Surgical History:  Procedure Laterality Date  . BREAST LUMPECTOMY WITH RADIOACTIVE SEED AND SENTINEL LYMPH NODE BIOPSY Right 03/18/2018  Procedure: RIGHT BREAST PARTIAL MASTECTOMY WITH RADIOACTIVE SEED AND SENTINEL LYMPH NODE BIOPSY;  Surgeon:  [name], MD;  Location: [place];  Service: General;  Laterality: Right;  . cesearean section     tubal ligation   . IR CV LINE INJECTION  08/28/2018  . IR IMAGING GUIDED PORT INSERTION  09/04/2018  . IR REMOVAL TUN ACCESS W/ PORT W/O FL MOD SED  09/04/2018  . IR REMOVAL TUN ACCESS W/ PORT W/O FL MOD SED  01/16/2019  . PORTACATH PLACEMENT Left 05/27/2018   Procedure: INSERTION PORT-A-CATH;  Surgeon: [name], MD;  Location: [place];  Service: General;  Laterality: Left;  . TUBAL LIGATION      2 Caesarian sections- tubal ligation at the second c-section.  FAMILY HISTORY Family History  Problem Relation Age of Onset  . Breast cancer Sister 62       approximate  . Cancer Father 70       started in shoulder, spread to spine, brain, lung  . Cancer Paternal [name]        'blood cancer'  . Heart attack Paternal Uncle   . Breast cancer Cousin        age dx unk  . Breast cancer Cousin        age dx unk  . Breast cancer Cousin        age dx unk   The patient's father passed away in the summer 2018 at the age of 18 due to metastatic lung cancer. He was a previous smoker. The patient's mother is alive at age 76. The patient has 1 brother and 1 sister. The patient's sister was diagnosed with breast cancer at the age of 71.  The patient has 3 paternal cousins diagnosed with breast cancer. She denies a family history of ovarian cancer.    GYNECOLOGIC HISTORY:  The patient's last menstrual period was in 05/2018 (days before chemotherapy started). Menarche: 53 years old. She used to be on a dance team.  Age at first live birth: 53 years old She is [place] P2. She was taking oral contraceptives, but this was discontinued as of January 2019.  She had bilateral tubal ligation at the time of her second c-section.    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  She has a PhD.  Her husband, Anna Conrad, is a Chief Strategy Officer for [employer], making metal  parts. He travels about 2-3 times per year. Their sons are Anna Conrad age 72 and Anna Conrad age 41. She attends [employer].  ADVANCED DIRECTIVES: In the absence of any documents to the contrary the patient's husband is automatically her healthcare power of attorney   HEALTH MAINTENANCE: Social History   Tobacco Use  . Smoking status: Never Smoker  . Smokeless tobacco: Never Used  Substance Use Topics  . Alcohol use: Yes    Comment: occ  . Drug use: Never     Colonoscopy:n/a  PAP: 2017/ normal  Bone density: n/a   Allergies  Allergen Reactions  . Adhesive [Tape] Rash  . Latex Rash    Red  rash    Current Outpatient Medications  Medication Sig Dispense Refill  . carvedilol (COREG) 3.125 MG tablet Take 1 tablet (3.125 mg total) by mouth 2 (two) times daily with a meal. 60 tablet 6  . gabapentin (NEURONTIN) 300 MG capsule Take 1 capsule (300 mg total) by mouth at bedtime. 90 capsule 4  . lisinopril-hydrochlorothiazide (PRINZIDE,ZESTORETIC) 10-12.5 MG  tablet Take 1 tablet by mouth daily.    . tamoxifen (NOLVADEX) 20 MG tablet Take 1 tablet (20 mg total) by mouth daily for 30 days. 90 tablet 12   No current facility-administered medications for this visit.     OBJECTIVE: Middle-aged white woman in no acute distress  Vitals:   06/03/19 1530  BP: 101/66  Pulse: 89  Resp: 18  Temp: 98.7 F (37.1 C)  SpO2: 99%     Body mass index is 33.8 kg/m.   Wt Readings from Last 3 Encounters:  06/03/19 196 lb 14.4 oz (89.3 kg)  02/10/19 192 lb 3.2 oz (87.2 kg)  12/20/18 189 lb 12.8 oz (86.1 kg)   ECOG FS:1 - Symptomatic but completely ambulatory  He has come back dark and curly Sclerae unicteric, EOMs intact Wearing a mask No cervical or supraclavicular adenopathy Lungs no rales or rhonchi Heart regular rate and rhythm Abd soft, nontender, positive bowel sounds MSK no focal spinal tenderness, no upper extremity lymphedema Neuro: nonfocal, well oriented,  appropriate affect Breasts: The right breast is status post lumpectomy and radiation.  It is slightly smaller than the left, but maintains a normal contour.  There is no evidence of disease recurrence.  Left breast is benign.  Both axillae are benign.   LAB RESULTS:  CMP     Component Value Date/Time   NA 139 06/03/2019 1512   K 3.9 06/03/2019 1512   CL 103 06/03/2019 1512   CO2 27 06/03/2019 1512   GLUCOSE 128 (H) 06/03/2019 1512   BUN 15 06/03/2019 1512   CREATININE 1.09 (H) 06/03/2019 1512   CALCIUM 8.7 (L) 06/03/2019 1512   PROT 7.3 06/03/2019 1512   ALBUMIN 3.9 06/03/2019 1512   AST 21 06/03/2019 1512   ALT 17 06/03/2019 1512   ALKPHOS 51 06/03/2019 1512   BILITOT 0.2 (L) 06/03/2019 1512   GFRNONAA 59 (L) 06/03/2019 1512   GFRAA >60 06/03/2019 1512    No results found for: TOTALPROTELP, ALBUMINELP, A1GS, A2GS, BETS, BETA2SER, GAMS, MSPIKE, SPEI  No results found for: KPAFRELGTCHN, LAMBDASER, KAPLAMBRATIO  Lab Results  Component Value Date   WBC 6.2 06/03/2019   NEUTROABS 3.9 06/03/2019   HGB 12.7 06/03/2019   HCT 38.6 06/03/2019   MCV 95.5 06/03/2019   PLT 256 06/03/2019    @LASTCHEMISTRY @  No results found for: LABCA2  No components found for: [id number]  No results for input(s): INR in the last 168 hours.  No results found for: LABCA2  No results found for: IWP809  No results found for: XIP382  No results found for: NKN397  No results found for: CA2729  No components found for: HGQUANT  No results found for: CEA1 / No results found for: CEA1   No results found for: AFPTUMOR  No results found for: CHROMOGRNA  No results found for: PSA1  Appointment on 06/03/2019  Component Date Value Ref Range Status  . Sodium 06/03/2019 139  135 - 145 mmol/L Final  . Potassium 06/03/2019 3.9  3.5 - 5.1 mmol/L Final  . Chloride 06/03/2019 103  98 - 111 mmol/L Final  . CO2 06/03/2019 27  22 - 32 mmol/L Final  . Glucose, Bld 06/03/2019 128* 70 - 99 mg/dL  Final  . BUN 06/03/2019 15  6 - 20 mg/dL Final  . Creatinine 06/03/2019 1.09* 0.44 - 1.00 mg/dL Final  . Calcium 06/03/2019 8.7* 8.9 - 10.3 mg/dL Final  . Total Protein 06/03/2019 7.3  6.5 - 8.1 g/dL Final  .  Albumin 06/03/2019 3.9  3.5 - 5.0 g/dL Final  . AST 06/03/2019 21  15 - 41 U/L Final  . ALT 06/03/2019 17  0 - 44 U/L Final  . Alkaline Phosphatase 06/03/2019 51  38 - 126 U/L Final  . Total Bilirubin 06/03/2019 0.2* 0.3 - 1.2 mg/dL Final  . GFR, Est Non Af Am 06/03/2019 59* >60 mL/min Final  . GFR, Est AFR Am 06/03/2019 >60  >60 mL/min Final  . Anion gap 06/03/2019 9  5 - 15 Final   Performed at [facility], [place] [address] [place], [place] [address]  . WBC Count 06/03/2019 6.2  4.0 - 10.5 K/uL Final  . RBC 06/03/2019 4.04  3.87 - 5.11 MIL/uL Final  . Hemoglobin 06/03/2019 12.7  12.0 - 15.0 g/dL Final  . HCT 06/03/2019 38.6  36.0 - 46.0 % Final  . MCV 06/03/2019 95.5  80.0 - 100.0 fL Final  . MCH 06/03/2019 31.4  26.0 - 34.0 pg Final  . MCHC 06/03/2019 32.9  30.0 - 36.0 g/dL Final  . RDW 06/03/2019 11.9  11.5 - 15.5 % Final  . Platelet Count 06/03/2019 256  150 - 400 K/uL Final  . nRBC 06/03/2019 0.0  0.0 - 0.2 % Final  . Neutrophils Relative % 06/03/2019 61  % Final  . Neutro Abs 06/03/2019 3.9  1.7 - 7.7 K/uL Final  . Lymphocytes Relative 06/03/2019 24  % Final  . Lymphs Abs 06/03/2019 1.5  0.7 - 4.0 K/uL Final  . Monocytes Relative 06/03/2019 8  % Final  . Monocytes Absolute 06/03/2019 0.5  0.1 - 1.0 K/uL Final  . Eosinophils Relative 06/03/2019 6  % Final  . Eosinophils Absolute 06/03/2019 0.3  0.0 - 0.5 K/uL Final  . Basophils Relative 06/03/2019 1  % Final  . Basophils Absolute 06/03/2019 0.1  0.0 - 0.1 K/uL Final  . Immature Granulocytes 06/03/2019 0  % Final  . Abs Immature Granulocytes 06/03/2019 0.01  0.00 - 0.07 K/uL Final   Performed at [facility], [address] [place], [place] [address]    (this displays  the last labs from the last 3 days)  No results found for: TOTALPROTELP, ALBUMINELP, A1GS, A2GS, BETS, BETA2SER, GAMS, MSPIKE, SPEI (this displays SPEP labs)  No results found for: KPAFRELGTCHN, LAMBDASER, KAPLAMBRATIO (kappa/lambda light chains)  No results found for: HGBA, HGBA2QUANT, HGBFQUANT, HGBSQUAN (Hemoglobinopathy evaluation)   No results found for: LDH  No results found for: IRON, TIBC, IRONPCTSAT (Iron and TIBC)  No results found for: FERRITIN  Urinalysis    Component Value Date/Time   COLORURINE YELLOW 06/25/2018 0815   APPEARANCEUR HAZY (A) 06/25/2018 0815   LABSPEC 1.019 06/25/2018 0815   PHURINE 6.0 06/25/2018 0815   GLUCOSEU NEGATIVE 06/25/2018 0815   HGBUR NEGATIVE 06/25/2018 0815   BILIRUBINUR NEGATIVE 06/25/2018 0815   KETONESUR NEGATIVE 06/25/2018 0815   PROTEINUR NEGATIVE 06/25/2018 0815   NITRITE NEGATIVE 06/25/2018 0815   LEUKOCYTESUR NEGATIVE 06/25/2018 0815     STUDIES: No results found.  ELIGIBLE FOR AVAILABLE RESEARCH PROTOCOL: BCEP   ASSESSMENT: 53 y.o. [place] woman status post right breast upper outer quadrant biopsy 02/07/2018 for a clinical T1c-T2 N0, stage IA-B invasive ductal carcinoma, grade 2, estrogen receptor and progesterone receptor positive, HER-2 not amplified, with an MIB-1 of 10%  (1) status post right lumpectomy 03/18/2018 for a pT1c pN0, stage IA invasive ductal carcinoma, grade 2, estrogen and progesterone receptor positive, with negative margins.  A  total of 1 lymph node was removed  (2) Mammaprint on 03/18/2018 showed high risk, indicating that with chemotherapy and hormone therapy, the patient would have a nearly 95% chance of having no distant metastases within the next 5 years  (3) chemotherapy consisting of doxorubicin and cyclophosphamide in dose dense fashion x4 started 06/04/2018, completed 07/23/2018 followed by weekly paclitaxel started 08/09/2018, discontinued 09/13/2018  (a) baseline echocardiogram on  05/23/2018 showed an ejection fraction in the 65-75% range  (b) adjuvant paclitaxel discontinued after 6 cycles because of neuropathy  (4) adjuvant radiation 10/16/2018 - 12/03/2018  Site/dose: The patient initially received a dose of 50.4 Gy in 28 fractions to the right breast using whole-breast tangent fields. This was delivered using a 3-D conformal technique. The patient then received a boost to the seroma. This delivered an additional 10 Gy in 5 fractions using 6x photons with a Complex Isodose technique. The total dose was 60.4 Gy.  (5) tamoxifen started January 2020  (6) genetics testing 03/13/2018 through the Common Hereditary Cancer Panel offered by [facility] ifound no deleterious mutations in APC, ATM, AXIN2, BARD1, BMPR1A, BRCA1, BRCA2, BRIP1, CDH1, CDKN2A (p14ARF), CDKN2A (p16INK4a), CKD4, CHEK2, CTNNA1, DICER1, EPCAM (Deletion/duplication testing only), GREM1 (promoter region deletion/duplication testing only), KIT, MEN1, MLH1, MSH2, MSH3, MSH6, MUTYH, NBN, NF1, NHTL1, PALB2, PDGFRA, PMS2, POLD1, POLE, PTEN, RAD50, RAD51C, RAD51D, SDHB, SDHC, SDHD, SMAD4, SMARCA4. STK11, TP53, TSC1, TSC2, and VHL.  The following genes were evaluated for sequence changes only: SDHA and HOXB13 c.251G>A variant only.  (a)  A variant of uncertain significance (VUS) in the gene MSH3 was also identified c.1027+4T>C (Intronic).    PLAN: Waleska is now just over a year out from definitive surgery for her breast cancer with no evidence of disease recurrence.  This is very favorable.  She is tolerating tamoxifen generally well.  I think we can help her with the nighttime hot flashes by adding gabapentin and we discussed that today.  I put in the prescription for her.  She will let me know if it does not work.  Her sister raised concerns regarding blood clots.  We discussed that that risk as well.  She understands that is similar to the risk of birth control pills or hormone replacement.  She is having low back  pain.  This is not going to be related to her cancer.  We discussed some stretching exercises and it is true that further activity will be helpful in that regard  Otherwise she is going to have repeat mammography, new baseline, in November and see me shortly after that.  She knows to call for any other issue that may develop before that visit.   Rory Xiang, Virgie Dad, MD  06/03/19 4:15 PM Medical Oncology and Hematology [facility] [address] [place],  [address] Tel. [phone]    Fax. [phone]  I, Jacqualyn Posey am acting as a Education administrator for Chauncey Cruel, MD.   I, Lurline Del MD, have reviewed the above documentation for accuracy and completeness, and I agree with the above.

## 2019-06-03 ENCOUNTER — Inpatient Hospital Stay: Payer: BC Managed Care – PPO

## 2019-06-03 ENCOUNTER — Inpatient Hospital Stay: Payer: BC Managed Care – PPO | Attending: Oncology | Admitting: Oncology

## 2019-06-03 ENCOUNTER — Other Ambulatory Visit: Payer: Self-pay

## 2019-06-03 VITALS — BP 101/66 | HR 89 | Temp 98.7°F | Resp 18 | Ht 64.0 in | Wt 196.9 lb

## 2019-06-03 DIAGNOSIS — C50411 Malignant neoplasm of upper-outer quadrant of right female breast: Secondary | ICD-10-CM | POA: Insufficient documentation

## 2019-06-03 DIAGNOSIS — Z170 Estrogen receptor positive status [ER+]: Secondary | ICD-10-CM | POA: Diagnosis not present

## 2019-06-03 DIAGNOSIS — Z79810 Long term (current) use of selective estrogen receptor modulators (SERMs): Secondary | ICD-10-CM

## 2019-06-03 LAB — CMP (CANCER CENTER ONLY)
ALT: 17 U/L (ref 0–44)
AST: 21 U/L (ref 15–41)
Albumin: 3.9 g/dL (ref 3.5–5.0)
Alkaline Phosphatase: 51 U/L (ref 38–126)
Anion gap: 9 (ref 5–15)
BUN: 15 mg/dL (ref 6–20)
CO2: 27 mmol/L (ref 22–32)
Calcium: 8.7 mg/dL — ABNORMAL LOW (ref 8.9–10.3)
Chloride: 103 mmol/L (ref 98–111)
Creatinine: 1.09 mg/dL — ABNORMAL HIGH (ref 0.44–1.00)
GFR, Est AFR Am: >60 mL/min (ref >60)
GFR, Estimated: 59 mL/min — ABNORMAL LOW (ref >60)
Glucose, Bld: 128 mg/dL — ABNORMAL HIGH (ref 70–99)
Potassium: 3.9 mmol/L (ref 3.5–5.1)
Sodium: 139 mmol/L (ref 135–145)
Total Bilirubin: 0.2 mg/dL — ABNORMAL LOW (ref 0.3–1.2)
Total Protein: 7.3 g/dL (ref 6.5–8.1)

## 2019-06-03 LAB — CBC WITH DIFFERENTIAL (CANCER CENTER ONLY)
Abs Immature Granulocytes: 0.01 10*3/uL (ref 0.00–0.07)
Basophils Absolute: 0.1 10*3/uL (ref 0.0–0.1)
Basophils Relative: 1 %
Eosinophils Absolute: 0.3 10*3/uL (ref 0.0–0.5)
Eosinophils Relative: 6 %
HCT: 38.6 % (ref 36.0–46.0)
Hemoglobin: 12.7 g/dL (ref 12.0–15.0)
Immature Granulocytes: 0 %
Lymphocytes Relative: 24 %
Lymphs Abs: 1.5 10*3/uL (ref 0.7–4.0)
MCH: 31.4 pg (ref 26.0–34.0)
MCHC: 32.9 g/dL (ref 30.0–36.0)
MCV: 95.5 fL (ref 80.0–100.0)
Monocytes Absolute: 0.5 10*3/uL (ref 0.1–1.0)
Monocytes Relative: 8 %
Neutro Abs: 3.9 10*3/uL (ref 1.7–7.7)
Neutrophils Relative %: 61 %
Platelet Count: 256 10*3/uL (ref 150–400)
RBC: 4.04 MIL/uL (ref 3.87–5.11)
RDW: 11.9 % (ref 11.5–15.5)
WBC Count: 6.2 10*3/uL (ref 4.0–10.5)
nRBC: 0 % (ref 0.0–0.2)

## 2019-06-03 MED ORDER — GABAPENTIN 300 MG PO CAPS: 300.0000 mg | ORAL_CAPSULE | Freq: Every day | ORAL | 4 refills | Status: DC

## 2019-06-03 MED ORDER — TAMOXIFEN CITRATE 20 MG PO TABS: 20.0000 mg | ORAL_TABLET | Freq: Every day | ORAL | 12 refills | Status: AC

## 2019-06-04 ENCOUNTER — Telehealth: Payer: Self-pay | Admitting: Oncology

## 2019-06-04 NOTE — Telephone Encounter (Signed)
I left a message regarding schedule  

## 2019-06-05 ENCOUNTER — Telehealth: Payer: Self-pay | Admitting: Oncology

## 2019-06-05 NOTE — Telephone Encounter (Signed)
I talk with patient and she asked to cancel SCP visit

## 2019-06-26 ENCOUNTER — Encounter: Payer: BC Managed Care – PPO | Admitting: Adult Health

## 2019-09-17 ENCOUNTER — Other Ambulatory Visit: Payer: Self-pay

## 2019-09-17 ENCOUNTER — Ambulatory Visit
Admission: RE | Admit: 2019-09-17 | Discharge: 2019-09-17 | Disposition: A | Discharge status: Home / Self Care | Payer: BC Managed Care – PPO | Source: Ambulatory Visit | Attending: Oncology | Admitting: Oncology

## 2019-09-17 DIAGNOSIS — C50411 Malignant neoplasm of upper-outer quadrant of right female breast: Secondary | ICD-10-CM

## 2019-09-17 DIAGNOSIS — Z170 Estrogen receptor positive status [ER+]: Secondary | ICD-10-CM

## 2019-10-26 NOTE — Progress Notes (Signed)
[place]  [phone] [phone]     ID: [name] DOB: 1966-08-18  MR#: [id number]  NZV#:[id number]  Patient Care Team: Filiberto Pinks as PCP - General (Physician Assistant) Magrinat, Virgie Dad, MD as Consulting Physician (Oncology) Kyung Rudd, MD as Consulting Physician (Radiation Oncology) [name], MD as Consulting Physician (General Surgery) Janyth Pupa, DO as Consulting Physician (Obstetrics and Gynecology) OTHER MD:  CHIEF COMPLAINT: Estrogen receptor positive breast cancer  CURRENT TREATMENT: Tamoxifen   INTERVAL HISTORY: Anna Conrad returns today for follow-up of her estrogen receptor positive breast cancer.   She continues on tamoxifen.  She feels hot but does not have horrible hot flashes all the time.  Vaginal wetness is not an issue  Since her last visit, she underwent bilateral diagnostic mammography with tomography at [facility] on 09/17/2019 showing: breast density category C; no evidence of malignancy in either breast.    REVIEW OF SYSTEMS: Senai is having some stress at home and at work.  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  They are very intrusive.  She also has to be at school for days at a 5.  Meanwhile her 2 children are home with her husband who is working from home so the kids really are not doing their work the way she would like.  Once she gets home in addition to cooking she has to help with the kids homework.  This does not leave a lot of time for exercise.  She is very worried about weight gain.  On the plus side she is delighted with her curls.  Chemotherapy does that but unfortunately after a year or so her hair will be straight again.  The family is taking appropriate pandemic precautions.  Aside from these issues a detailed review of systems today was stable   HISTORY OF  CURRENT ILLNESS: From the original intake note:  Tamryn Arneisha Kincannon had routine screening mammography on 01/29/2018 showing a possible mass with associated calcifications in the right breast. She underwent unilateral right diagnostic mammography with tomography and right breast ultrasonography at [facility] on 02/05/2018 showing a highly suspicious mass over the 10:30 position upper outer of the right breast located 6 cm from the nipple measuring 1.3 x 1.4 x 1.5 cm. Associated microcalcifications corresponding to the mammographic abnormality. Ultrasound of the right axilla was normal.  Accordingly on 02/29/2019 she proceeded to biopsy of the right breast area in question. 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 Prognostic indicators significant for: estrogen receptor, 100% positive with strong staining intensity and progesterone receptor, 20% positive iwht moderate staining intensity. Proliferation marker Ki67 at 10%. HER2 not amplified with ratios HER2/CEP17 signals 1.17 and average HER2 copies per cell 2.45.  On 02/21/2018 the patient had bilateral breast MRI.  This measured the upper outer quadrant right breast mass at 2.3 cm including an area of surrounding non-masslike enhancement.  There was no evidence of multifocal or multicentric disease, no lymphadenopathy, and no findings in the left breast.  The patient's subsequent history is as detailed below.   PAST MEDICAL HISTORY: Past Medical History:  Diagnosis Date  . Blood dyscrasia    hx blood clott superficial in rt arm  . Cancer [facility]) 2019   right breast cancer  . Family history of breast cancer   . Heart murmur   .  Hypertension   She used to have migraines. She denies GERD, asthma, emphysema. She was told that she had a slight heart murmur. She denies heart palpitations.    PAST SURGICAL HISTORY: Past Surgical History:  Procedure Laterality Date   . BREAST LUMPECTOMY Right 2019  . BREAST LUMPECTOMY WITH RADIOACTIVE SEED AND SENTINEL LYMPH NODE BIOPSY Right 03/18/2018   Procedure: RIGHT BREAST PARTIAL MASTECTOMY WITH RADIOACTIVE SEED AND SENTINEL LYMPH NODE BIOPSY;  Surgeon: [name], MD;  Location: [facility];  Service: General;  Laterality: Right;  . cesearean section     tubal ligation   . IR CV LINE INJECTION  08/28/2018  . IR IMAGING GUIDED PORT INSERTION  09/04/2018  . IR REMOVAL TUN ACCESS W/ PORT W/O FL MOD SED  09/04/2018  . IR REMOVAL TUN ACCESS W/ PORT W/O FL MOD SED  01/16/2019  . PORTACATH PLACEMENT Left 05/27/2018   Procedure: INSERTION PORT-A-CATH;  Surgeon: [name], MD;  Location: [facility];  Service: General;  Laterality: Left;  . TUBAL LIGATION     2 Caesarian sections- tubal ligation at the second c-section.   FAMILY HISTORY Family History  Problem Relation Age of Onset  . Breast cancer Sister 9       approximate  . Cancer Father 77       started in shoulder, spread to spine, brain, lung  . Cancer Paternal [name]        'blood cancer'  . Heart attack Paternal Uncle   . Breast cancer Cousin        age dx unk  . Breast cancer Cousin        age dx unk  . Breast cancer Cousin        age dx unk   The patient's father passed away in the summer 2018 at the age of 38 due to metastatic lung cancer. He was a previous smoker. The patient's mother is alive at age 59. The patient has 1 brother and 1 sister. The patient's sister was diagnosed with breast cancer at the age of 4.  The patient has 3 paternal cousins diagnosed with breast cancer. She denies a family history of ovarian cancer.    GYNECOLOGIC HISTORY:  The patient's last menstrual period was in 05/2018 (days before chemotherapy started). Menarche: 53 years old. She used to be on a dance team.  Age at first live birth: 53 years old She is [place] P2. She was taking oral contraceptives, but this was discontinued as of January 2019.   She  had bilateral tubal ligation at the time of her second c-section.    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  She has a PhD.  Her husband, [name], is a Chief Strategy Officer for [employer], making metal parts. He travels about 2-3 times per year. Their sons are [name] age 28 and [name] age 103 (as of November 2020).. She attends [employer].  ADVANCED DIRECTIVES: In the absence of any documents to the contrary the patient's husband is automatically her healthcare power of attorney   HEALTH MAINTENANCE: Social History   Tobacco Use  . Smoking status: Never Smoker  . Smokeless tobacco: Never Used  Substance Use Topics  . Alcohol use: Yes    Comment: occ  . Drug use: Never     Colonoscopy:n/a  PAP: 2017/ normal  Bone density: n/a   Allergies  Allergen Reactions  . Adhesive [Tape] Rash  . Latex Rash  Red  rash    Current Outpatient Medications  Medication Sig Dispense Refill  . carvedilol (COREG) 3.125 MG tablet Take 1 tablet (3.125 mg total) by mouth 2 (two) times daily with a meal. 60 tablet 6  . gabapentin (NEURONTIN) 300 MG capsule Take 1 capsule (300 mg total) by mouth at bedtime. 90 capsule 4  . lisinopril-hydrochlorothiazide (PRINZIDE,ZESTORETIC) 10-12.5 MG tablet Take 1 tablet by mouth daily.     No current facility-administered medications for this visit.     OBJECTIVE: Middle-aged white woman in no acute distress  There were no vitals filed for this visit.   There is no height or weight on file to calculate BMI.   Wt Readings from Last 3 Encounters:  06/03/19 196 lb 14.4 oz (89.3 kg)  02/10/19 192 lb 3.2 oz (87.2 kg)  12/20/18 189 lb 12.8 oz (86.1 kg)   ECOG FS:1 - Symptomatic but completely ambulatory  Sclerae unicteric, EOMs intact Wearing a mask No cervical or supraclavicular adenopathy Lungs no rales or rhonchi Heart regular rate and rhythm Abd soft, nontender, positive bowel sounds MSK no focal spinal  tenderness, no upper extremity lymphedema Neuro: nonfocal, well oriented, appropriate affect Breasts: The right breast has undergone lumpectomy and radiation.  There is no evidence of disease recurrence.  The left breast is benign.  Both axillae are benign. Skin: There is a nonconfluent faintly erythematous sandpaperlike rash in the anterior neck and upper chest  LAB RESULTS:  CMP     Component Value Date/Time   NA 139 06/03/2019 1512   K 3.9 06/03/2019 1512   CL 103 06/03/2019 1512   CO2 27 06/03/2019 1512   GLUCOSE 128 (H) 06/03/2019 1512   BUN 15 06/03/2019 1512   CREATININE 1.09 (H) 06/03/2019 1512   CALCIUM 8.7 (L) 06/03/2019 1512   PROT 7.3 06/03/2019 1512   ALBUMIN 3.9 06/03/2019 1512   AST 21 06/03/2019 1512   ALT 17 06/03/2019 1512   ALKPHOS 51 06/03/2019 1512   BILITOT 0.2 (L) 06/03/2019 1512   GFRNONAA 59 (L) 06/03/2019 1512   GFRAA >60 06/03/2019 1512    No results found for: TOTALPROTELP, ALBUMINELP, A1GS, A2GS, BETS, BETA2SER, GAMS, MSPIKE, SPEI  No results found for: KPAFRELGTCHN, LAMBDASER, KAPLAMBRATIO  Lab Results  Component Value Date   WBC 6.2 06/03/2019   NEUTROABS 3.9 06/03/2019   HGB 12.7 06/03/2019   HCT 38.6 06/03/2019   MCV 95.5 06/03/2019   PLT 256 06/03/2019   No results found for: LABCA2  No components found for: [id number]  No results for input(s): INR in the last 168 hours.  No results found for: LABCA2  No results found for: MBW466  No results found for: ZLD357  No results found for: SVX793  No results found for: CA2729  No components found for: HGQUANT  No results found for: CEA1 / No results found for: CEA1   No results found for: AFPTUMOR  No results found for: CHROMOGRNA  No results found for: PSA1  No visits with results within 3 Day(s) from this visit.  Latest known visit with results is:  Appointment on 06/03/2019  Component Date Value Ref Range Status  . Sodium 06/03/2019 139  135 - 145 mmol/L Final  .  Potassium 06/03/2019 3.9  3.5 - 5.1 mmol/L Final  . Chloride 06/03/2019 103  98 - 111 mmol/L Final  . CO2 06/03/2019 27  22 - 32 mmol/L Final  . Glucose, Bld 06/03/2019 128* 70 - 99 mg/dL Final  . BUN  06/03/2019 15  6 - 20 mg/dL Final  . Creatinine 06/03/2019 1.09* 0.44 - 1.00 mg/dL Final  . Calcium 06/03/2019 8.7* 8.9 - 10.3 mg/dL Final  . Total Protein 06/03/2019 7.3  6.5 - 8.1 g/dL Final  . Albumin 06/03/2019 3.9  3.5 - 5.0 g/dL Final  . AST 06/03/2019 21  15 - 41 U/L Final  . ALT 06/03/2019 17  0 - 44 U/L Final  . Alkaline Phosphatase 06/03/2019 51  38 - 126 U/L Final  . Total Bilirubin 06/03/2019 0.2* 0.3 - 1.2 mg/dL Final  . GFR, Est Non Af Am 06/03/2019 59* >60 mL/min Final  . GFR, Est AFR Am 06/03/2019 >60  >60 mL/min Final  . Anion gap 06/03/2019 9  5 - 15 Final   Performed at [facility], [place] [address] [place], [place] [address]  . WBC Count 06/03/2019 6.2  4.0 - 10.5 K/uL Final  . RBC 06/03/2019 4.04  3.87 - 5.11 MIL/uL Final  . Hemoglobin 06/03/2019 12.7  12.0 - 15.0 g/dL Final  . HCT 06/03/2019 38.6  36.0 - 46.0 % Final  . MCV 06/03/2019 95.5  80.0 - 100.0 fL Final  . MCH 06/03/2019 31.4  26.0 - 34.0 pg Final  . MCHC 06/03/2019 32.9  30.0 - 36.0 g/dL Final  . RDW 06/03/2019 11.9  11.5 - 15.5 % Final  . Platelet Count 06/03/2019 256  150 - 400 K/uL Final  . nRBC 06/03/2019 0.0  0.0 - 0.2 % Final  . Neutrophils Relative % 06/03/2019 61  % Final  . Neutro Abs 06/03/2019 3.9  1.7 - 7.7 K/uL Final  . Lymphocytes Relative 06/03/2019 24  % Final  . Lymphs Abs 06/03/2019 1.5  0.7 - 4.0 K/uL Final  . Monocytes Relative 06/03/2019 8  % Final  . Monocytes Absolute 06/03/2019 0.5  0.1 - 1.0 K/uL Final  . Eosinophils Relative 06/03/2019 6  % Final  . Eosinophils Absolute 06/03/2019 0.3  0.0 - 0.5 K/uL Final  . Basophils Relative 06/03/2019 1  % Final  . Basophils Absolute 06/03/2019 0.1  0.0 - 0.1 K/uL Final  . Immature Granulocytes 06/03/2019 0  %  Final  . Abs Immature Granulocytes 06/03/2019 0.01  0.00 - 0.07 K/uL Final   Performed at [facility], [address] [place], [place] [address]    (this displays the last labs from the last 3 days)  No results found for: TOTALPROTELP, ALBUMINELP, A1GS, A2GS, BETS, BETA2SER, GAMS, MSPIKE, SPEI (this displays SPEP labs)  No results found for: KPAFRELGTCHN, LAMBDASER, KAPLAMBRATIO (kappa/lambda light chains)  No results found for: HGBA, HGBA2QUANT, HGBFQUANT, HGBSQUAN (Hemoglobinopathy evaluation)   No results found for: LDH  No results found for: IRON, TIBC, IRONPCTSAT (Iron and TIBC)  No results found for: FERRITIN  Urinalysis    Component Value Date/Time   COLORURINE YELLOW 06/25/2018 0815   APPEARANCEUR HAZY (A) 06/25/2018 0815   LABSPEC 1.019 06/25/2018 0815   PHURINE 6.0 06/25/2018 0815   GLUCOSEU NEGATIVE 06/25/2018 0815   HGBUR NEGATIVE 06/25/2018 0815   BILIRUBINUR NEGATIVE 06/25/2018 0815   KETONESUR NEGATIVE 06/25/2018 0815   PROTEINUR NEGATIVE 06/25/2018 0815   NITRITE NEGATIVE 06/25/2018 0815   LEUKOCYTESUR NEGATIVE 06/25/2018 0815     STUDIES: No results found.    ELIGIBLE FOR AVAILABLE RESEARCH PROTOCOL: BCEP   ASSESSMENT: 53 y.o. [place] woman status post right breast upper outer quadrant biopsy 02/07/2018 for a clinical T1c-T2 N0, stage IA-B invasive ductal carcinoma,  grade 2, estrogen receptor and progesterone receptor positive, HER-2 not amplified, with an MIB-1 of 10%  (1) status post right lumpectomy 03/18/2018 for a pT1c pN0, stage IA invasive ductal carcinoma, grade 2, estrogen and progesterone receptor positive, with negative margins.  A total of 1 lymph node was removed  (2) Mammaprint on 03/18/2018 showed high risk, indicating that with chemotherapy and hormone therapy, the patient would have a nearly 95% chance of having no distant metastases within the next 5 years  (3) chemotherapy consisting of doxorubicin  and cyclophosphamide in dose dense fashion x4 started 06/04/2018, completed 07/23/2018 followed by weekly paclitaxel started 08/09/2018, discontinued 09/13/2018  (a) baseline echocardiogram on 05/23/2018 showed an ejection fraction in the 65-75% range  (b) adjuvant paclitaxel discontinued after 6 cycles because of neuropathy  (4) adjuvant radiation 10/16/2018 - 12/03/2018  Site/dose: The patient initially received a dose of 50.4 Gy in 28 fractions to the right breast using whole-breast tangent fields. This was delivered using a 3-D conformal technique. The patient then received a boost to the seroma. This delivered an additional 10 Gy in 5 fractions using 6x photons with a Complex Isodose technique. The total dose was 60.4 Gy.  (5) tamoxifen started January 2020  (6) genetics testing 03/13/2018 through the Common Hereditary Cancer Panel offered by [facility] ifound no deleterious mutations in APC, ATM, AXIN2, BARD1, BMPR1A, BRCA1, BRCA2, BRIP1, CDH1, CDKN2A (p14ARF), CDKN2A (p16INK4a), CKD4, CHEK2, CTNNA1, DICER1, EPCAM (Deletion/duplication testing only), GREM1 (promoter region deletion/duplication testing only), KIT, MEN1, MLH1, MSH2, MSH3, MSH6, MUTYH, NBN, NF1, NHTL1, PALB2, PDGFRA, PMS2, POLD1, POLE, PTEN, RAD50, RAD51C, RAD51D, SDHB, SDHC, SDHD, SMAD4, SMARCA4. STK11, TP53, TSC1, TSC2, and VHL.  The following genes were evaluated for sequence changes only: SDHA and HOXB13 c.251G>A variant only.  (a)  A variant of uncertain significance (VUS) in the gene MSH3 was also identified c.1027+4T>C (Intronic).    PLAN: Delorise is now a year and a half out from definitive surgery for her breast cancer with no evidence of disease recurrence.  That is favorable.  She is tolerating tamoxifen well and the plan will be to continue that a minimum of 5 years.  We discussed the fact that weight gain is common at menopause, the average being between 15 and 20 pounds.  She does not want to gain weight she will have  to cut on the calories.  In addition I strongly encouraged her to start a daily exercise program.  She is very motivated already.  She has not had any periods since her earlier treatment.  She understands tamoxifen can "hide" periods.  In any case she is status post bilateral tubal ligations.  I am not sure what the cause of the slight but persistent rash she has around the anterior neck may be.  She will try some Cortaid cream and see if that helps  Otherwise she will see me again in 1 year, after her mammogram next October.  She knows to call for any other issue that may develop before then.  Magrinat, Virgie Dad, MD  10/27/19 8:11 AM Medical Oncology and Hematology [facility] [place], [place] [address] Tel. [phone]    Fax. [phone]   I, Wilburn Mylar, am acting as scribe for Dr. Virgie Dad. Magrinat.  I, Lurline Del MD, have reviewed the above documentation for accuracy and completeness, and I agree with the above.

## 2019-10-27 ENCOUNTER — Inpatient Hospital Stay: Payer: BC Managed Care – PPO | Attending: Oncology | Admitting: Oncology

## 2019-10-27 ENCOUNTER — Inpatient Hospital Stay: Payer: BC Managed Care – PPO

## 2019-10-27 ENCOUNTER — Telehealth: Payer: Self-pay | Admitting: Oncology

## 2019-10-27 ENCOUNTER — Other Ambulatory Visit: Payer: Self-pay

## 2019-10-27 VITALS — BP 122/88 | HR 73 | Temp 98.7°F | Resp 18 | Ht 64.0 in | Wt 196.0 lb

## 2019-10-27 DIAGNOSIS — C50411 Malignant neoplasm of upper-outer quadrant of right female breast: Secondary | ICD-10-CM | POA: Insufficient documentation

## 2019-10-27 DIAGNOSIS — G629 Polyneuropathy, unspecified: Secondary | ICD-10-CM | POA: Diagnosis present

## 2019-10-27 DIAGNOSIS — Z170 Estrogen receptor positive status [ER+]: Secondary | ICD-10-CM | POA: Insufficient documentation

## 2019-10-27 DIAGNOSIS — R21 Rash and other nonspecific skin eruption: Secondary | ICD-10-CM | POA: Insufficient documentation

## 2019-10-27 DIAGNOSIS — Z79810 Long term (current) use of selective estrogen receptor modulators (SERMs): Secondary | ICD-10-CM | POA: Insufficient documentation

## 2019-10-27 LAB — CBC WITH DIFFERENTIAL/PLATELET
Abs Immature Granulocytes: 0.01 10*3/uL (ref 0.00–0.07)
Basophils Absolute: 0 10*3/uL (ref 0.0–0.1)
Basophils Relative: 1 %
Eosinophils Absolute: 0.3 10*3/uL (ref 0.0–0.5)
Eosinophils Relative: 5 %
HCT: 37.1 % (ref 36.0–46.0)
Hemoglobin: 12.5 g/dL (ref 12.0–15.0)
Immature Granulocytes: 0 %
Lymphocytes Relative: 21 %
Lymphs Abs: 1 10*3/uL (ref 0.7–4.0)
MCH: 32.1 pg (ref 26.0–34.0)
MCHC: 33.7 g/dL (ref 30.0–36.0)
MCV: 95.1 fL (ref 80.0–100.0)
Monocytes Absolute: 0.5 10*3/uL (ref 0.1–1.0)
Monocytes Relative: 9 %
Neutro Abs: 3.1 10*3/uL (ref 1.7–7.7)
Neutrophils Relative %: 64 %
Platelets: 236 10*3/uL (ref 150–400)
RBC: 3.9 MIL/uL (ref 3.87–5.11)
RDW: 12.2 % (ref 11.5–15.5)
WBC: 4.9 10*3/uL (ref 4.0–10.5)
nRBC: 0 % (ref 0.0–0.2)

## 2019-10-27 LAB — COMPREHENSIVE METABOLIC PANEL
ALT: 14 U/L (ref 0–44)
AST: 16 U/L (ref 15–41)
Albumin: 3.9 g/dL (ref 3.5–5.0)
Alkaline Phosphatase: 49 U/L (ref 38–126)
Anion gap: 8 (ref 5–15)
BUN: 17 mg/dL (ref 6–20)
CO2: 28 mmol/L (ref 22–32)
Calcium: 8.8 mg/dL — ABNORMAL LOW (ref 8.9–10.3)
Chloride: 105 mmol/L (ref 98–111)
Creatinine, Ser: 1.08 mg/dL — ABNORMAL HIGH (ref 0.44–1.00)
GFR calc Af Amer: >60 mL/min (ref >60)
GFR calc non Af Amer: 60 mL/min — ABNORMAL LOW (ref >60)
Glucose, Bld: 71 mg/dL (ref 70–99)
Potassium: 3.9 mmol/L (ref 3.5–5.1)
Sodium: 141 mmol/L (ref 135–145)
Total Bilirubin: 0.3 mg/dL (ref 0.3–1.2)
Total Protein: 6.7 g/dL (ref 6.5–8.1)

## 2019-10-27 MED ORDER — GABAPENTIN 300 MG PO CAPS: 300.0000 mg | ORAL_CAPSULE | Freq: Every day | ORAL | 4 refills | Status: DC

## 2019-10-27 MED ORDER — TAMOXIFEN CITRATE 20 MG PO TABS: 20.0000 mg | ORAL_TABLET | Freq: Every day | ORAL | 12 refills | Status: AC

## 2019-10-27 NOTE — Telephone Encounter (Signed)
I talk with patient regarding schedule  

## 2020-02-11 IMAGING — MG MM DIGITAL DIAGNOSTIC BILAT W/ TOMO W/ CAD
6 of 9 series · 6 of 25 positions shown · non-contrast
Comparison: Previous exam(s).

CLINICAL DATA: Patient has a history of invasive right breast
carcinoma in 6632 status post lumpectomy and radiation. The patient
is on tamoxifen.

EXAM:
DIGITAL DIAGNOSTIC BILATERAL MAMMOGRAM WITH CAD AND TOMO

[R MLO]
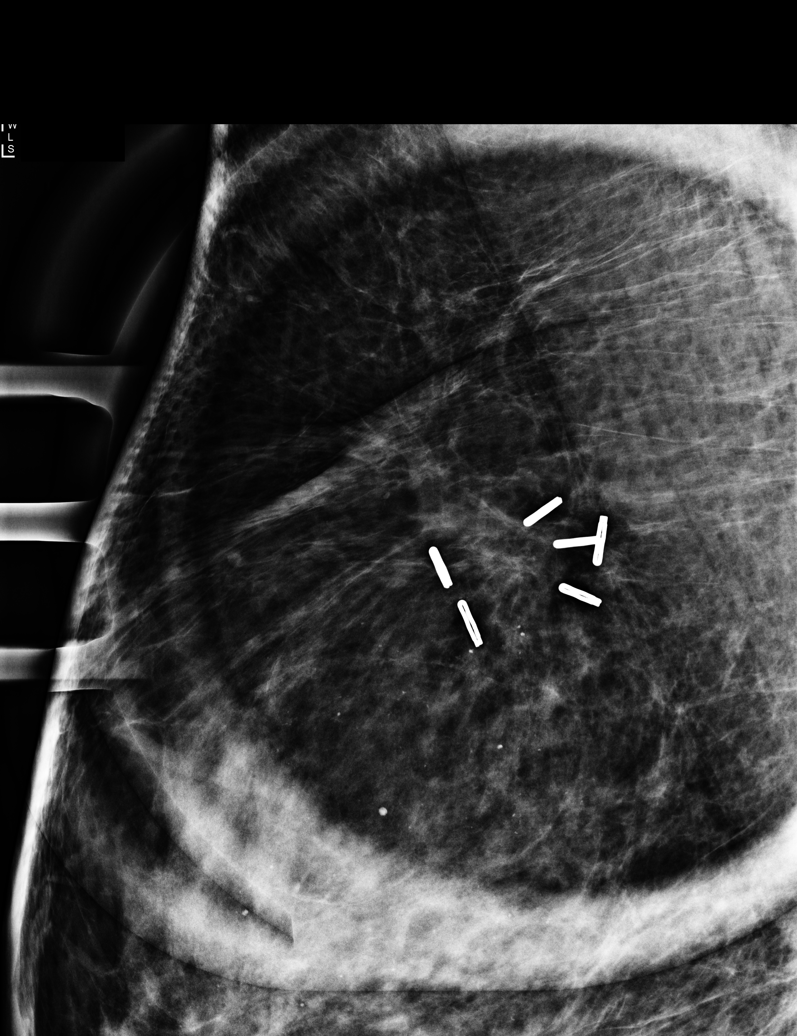

[L MLO synth-2D]
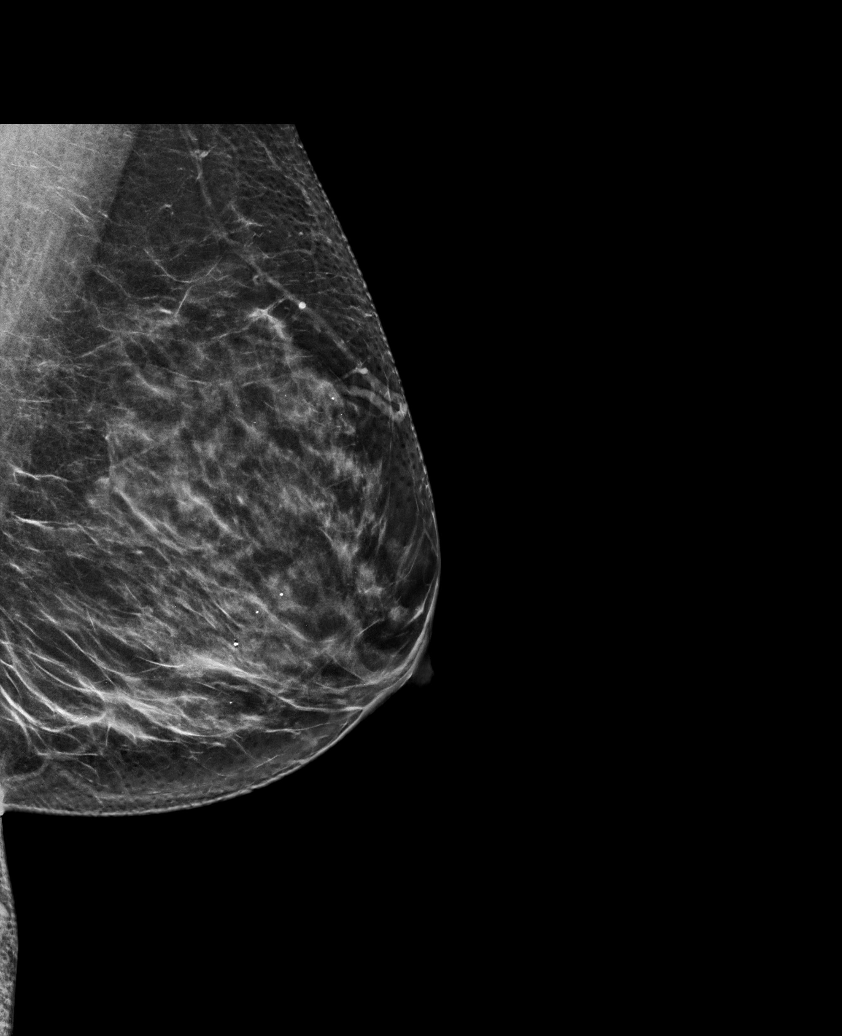

[R MLO synth-2D]
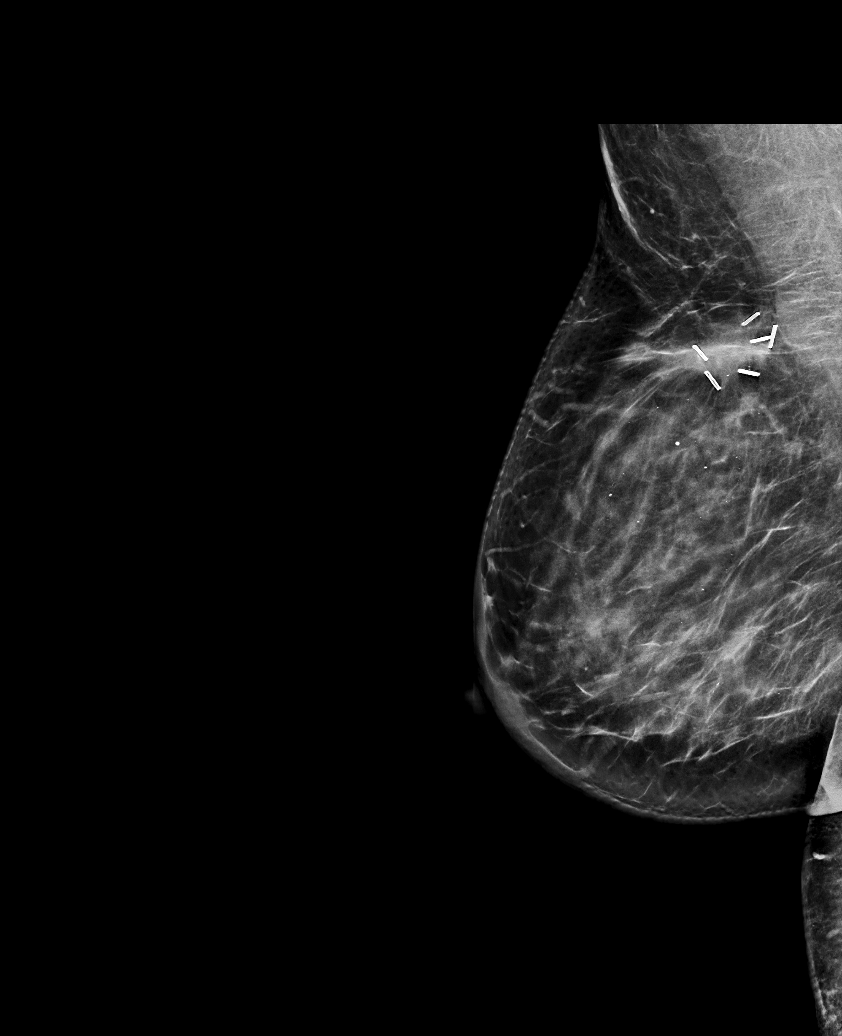

[R CC synth-2D]
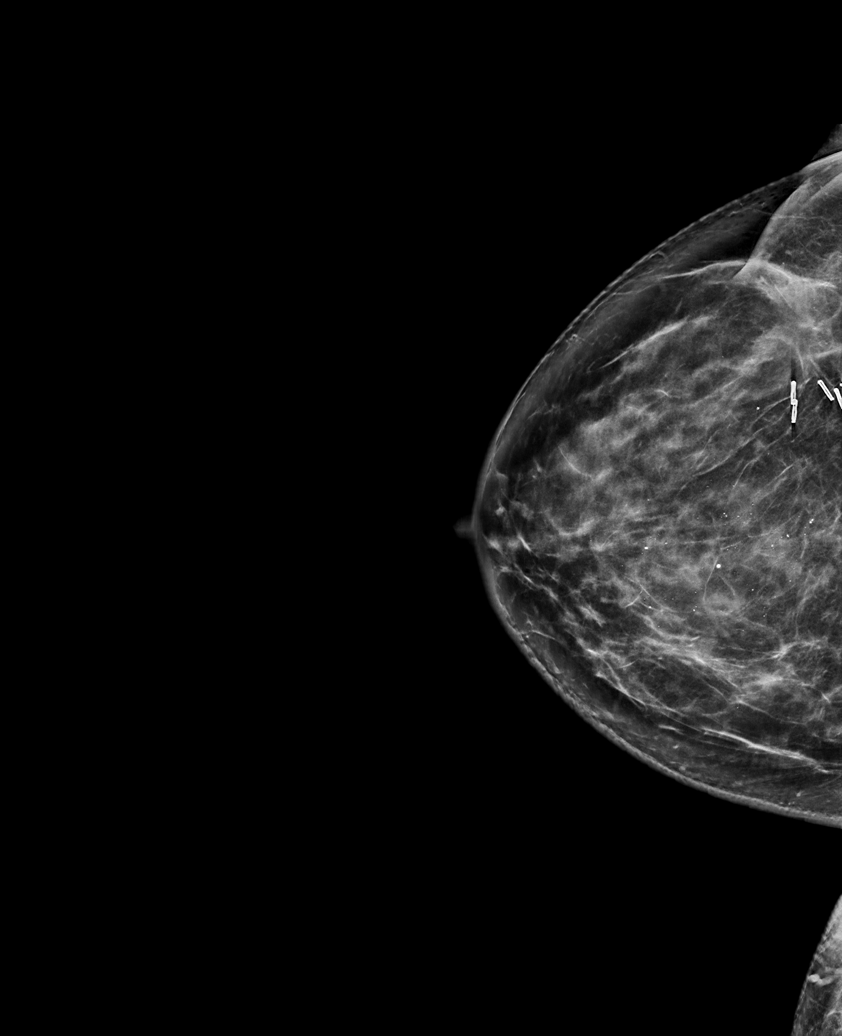

[L CC synth-2D]
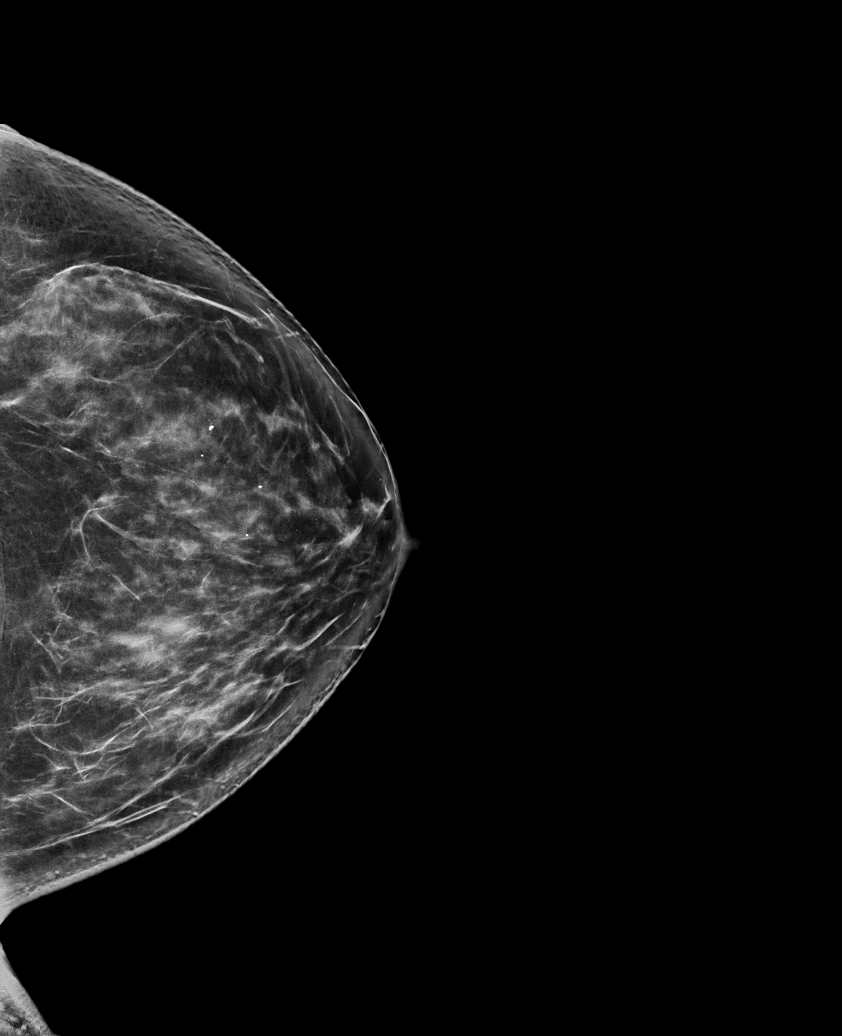

[R MLO tomo · tomo slice 45/90.0]
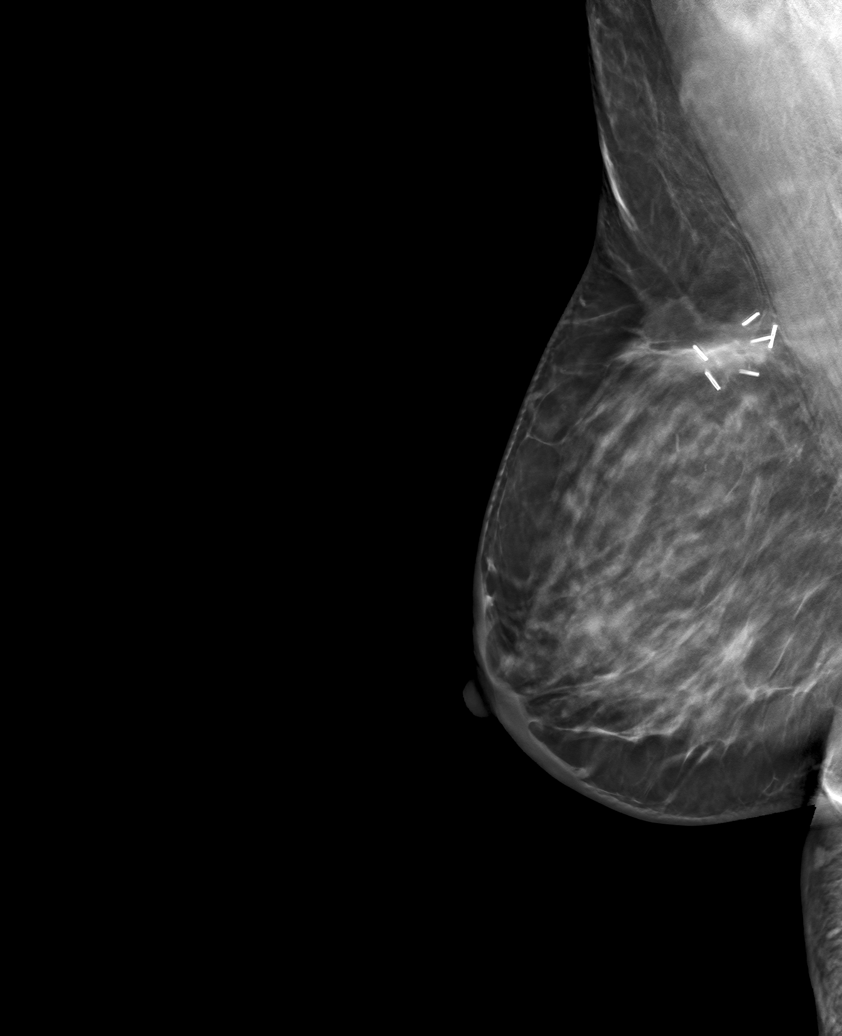

[6 of 25 positions shown; findings below may reference images not displayed]

ACR Breast Density Category c: The breast tissue is heterogeneously
dense, which may obscure small masses.
FINDINGS: There are post lumpectomy changes in the upper outer right breast
including increased density and architectural distortion. No
suspicious mass, distortion, or microcalcifications are identified
to suggest presence of malignancy.

Mammographic images were processed with CAD.
IMPRESSION: Interval post lumpectomy changes in the upper outer right breast. No
mammographic evidence of malignancy.

RECOMMENDATION:
Diagnostic bilateral mammogram in 1 year.

I have discussed the findings and recommendations with the patient.
If applicable, a reminder letter will be sent to the patient
regarding the next appointment.

BI-RADS CATEGORY  2: Benign.

## 2020-06-02 ENCOUNTER — Other Ambulatory Visit: Payer: Self-pay

## 2020-06-02 ENCOUNTER — Encounter (HOSPITAL_COMMUNITY): Payer: Self-pay

## 2020-06-02 ENCOUNTER — Emergency Department (HOSPITAL_COMMUNITY)
Admission: EM | Admit: 2020-06-02 | Discharge: 2020-06-02 | Disposition: A | Discharge status: Home / Self Care | Payer: BC Managed Care – PPO | Attending: Emergency Medicine | Admitting: Emergency Medicine

## 2020-06-02 DIAGNOSIS — R1010 Upper abdominal pain, unspecified: Secondary | ICD-10-CM | POA: Insufficient documentation

## 2020-06-02 DIAGNOSIS — R1111 Vomiting without nausea: Secondary | ICD-10-CM | POA: Diagnosis present

## 2020-06-02 DIAGNOSIS — Z5321 Procedure and treatment not carried out due to patient leaving prior to being seen by health care provider: Secondary | ICD-10-CM | POA: Insufficient documentation

## 2020-06-02 LAB — CBC
HCT: 36.3 % (ref 36.0–46.0)
Hemoglobin: 12.2 g/dL (ref 12.0–15.0)
MCH: 32.5 pg (ref 26.0–34.0)
MCHC: 33.6 g/dL (ref 30.0–36.0)
MCV: 96.8 fL (ref 80.0–100.0)
Platelets: 226 10*3/uL (ref 150–400)
RBC: 3.75 MIL/uL — ABNORMAL LOW (ref 3.87–5.11)
RDW: 12.3 % (ref 11.5–15.5)
WBC: 11.2 10*3/uL — ABNORMAL HIGH (ref 4.0–10.5)
nRBC: 0 % (ref 0.0–0.2)

## 2020-06-02 LAB — COMPREHENSIVE METABOLIC PANEL
ALT: 17 U/L (ref 0–44)
AST: 20 U/L (ref 15–41)
Albumin: 4.1 g/dL (ref 3.5–5.0)
Alkaline Phosphatase: 46 U/L (ref 38–126)
Anion gap: 8 (ref 5–15)
BUN: 21 mg/dL — ABNORMAL HIGH (ref 6–20)
CO2: 25 mmol/L (ref 22–32)
Calcium: 8.4 mg/dL — ABNORMAL LOW (ref 8.9–10.3)
Chloride: 108 mmol/L (ref 98–111)
Creatinine, Ser: 0.9 mg/dL (ref 0.44–1.00)
GFR calc Af Amer: >60 mL/min (ref >60)
GFR calc non Af Amer: >60 mL/min (ref >60)
Glucose, Bld: 116 mg/dL — ABNORMAL HIGH (ref 70–99)
Potassium: 3.4 mmol/L — ABNORMAL LOW (ref 3.5–5.1)
Sodium: 141 mmol/L (ref 135–145)
Total Bilirubin: 0.6 mg/dL (ref 0.3–1.2)
Total Protein: 6.9 g/dL (ref 6.5–8.1)

## 2020-06-02 LAB — LIPASE, BLOOD: Lipase: 35 U/L (ref 11–51)

## 2020-06-02 MED ORDER — SODIUM CHLORIDE 0.9% FLUSH: 3.0000 mL | Freq: Once | INTRAVENOUS | Status: DC

## 2020-06-02 MED ORDER — ONDANSETRON 4 MG PO TBDP
4.0000 mg | ORAL_TABLET | Freq: Once | ORAL | Status: AC | PRN
  Administered 2020-06-02: 4 mg via ORAL
  Filled 2020-06-02: qty 1

## 2020-06-02 NOTE — ED Triage Notes (Signed)
Patient arrived with complaints of upper mid abdominal pain that started at midnight. Reports taking tums and advil with no relief. States she has had 6 episodes of vomiting.

## 2020-06-02 NOTE — ED Notes (Signed)
Attempted to obtain blood work x2 with no success.

## 2020-08-03 ENCOUNTER — Other Ambulatory Visit: Payer: Self-pay | Admitting: Family Medicine

## 2020-09-03 ENCOUNTER — Other Ambulatory Visit: Payer: Self-pay | Admitting: Oncology

## 2020-09-03 DIAGNOSIS — Z9889 Other specified postprocedural states: Secondary | ICD-10-CM

## 2020-09-22 ENCOUNTER — Ambulatory Visit
Admission: RE | Admit: 2020-09-22 | Discharge: 2020-09-22 | Disposition: A | Discharge status: Home / Self Care | Payer: BC Managed Care – PPO | Source: Ambulatory Visit | Attending: Oncology | Admitting: Oncology

## 2020-09-22 ENCOUNTER — Other Ambulatory Visit: Payer: Self-pay

## 2020-09-22 DIAGNOSIS — Z9889 Other specified postprocedural states: Secondary | ICD-10-CM

## 2020-10-25 NOTE — Progress Notes (Signed)
Anna Conrad  [phone] Fax:([phone]) [phone]     ID: Anna Conrad DOB: [birth year]  MR#: [id number]  SWF#:[id number]  Patient Care Team: Filiberto Pinks as PCP - General (Physician Assistant) Yianni Skilling, Virgie Dad, MD as Consulting Physician (Oncology) Kyung Rudd, MD as Consulting Physician (Radiation Oncology) [name], MD as Consulting Physician (General Surgery) Janyth Pupa, DO as Consulting Physician (Obstetrics and Gynecology) OTHER MD:  CHIEF COMPLAINT: Estrogen receptor positive breast cancer  CURRENT TREATMENT: Tamoxifen   INTERVAL HISTORY: Anna Conrad returns today for follow-up of her estrogen receptor positive breast cancer.   She continues on tamoxifen.  Hot flashes are minimal.  Vaginal dryness is not an issue.  She is stopped taking the nighttime gabapentin as it was not particularly helpful.  Since her last visit, she underwent bilateral diagnostic mammography with tomography at [facility] on 09/22/2020 showing: breast density category C; no evidence of malignancy in either breast.    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  Some of the cancer mast and some not.  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   COVID 19 VACCINATION STATUS: Status post [employer] x2; no booster as of November 2021   HISTORY OF CURRENT ILLNESS: From the original intake note:  Anna Conrad had routine screening mammography on 01/29/2018 showing a possible mass with associated calcifications in the right breast. She underwent unilateral right diagnostic mammography with tomography and right breast ultrasonography at [facility] on 02/05/2018 showing a highly suspicious mass over the 10:30 position upper outer of the right breast located 6 cm from the nipple measuring 1.3 x 1.4 x 1.5 cm. Associated microcalcifications corresponding to the mammographic  abnormality. Ultrasound of the right axilla was normal.  Accordingly on 02/29/2019 she proceeded to biopsy of the right breast area in question. 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 Prognostic indicators significant for: estrogen receptor, 100% positive with strong staining intensity and progesterone receptor, 20% positive iwht moderate staining intensity. Proliferation marker Ki67 at 10%. HER2 not amplified with ratios HER2/CEP17 signals 1.17 and average HER2 copies per cell 2.45.  On 02/21/2018 the patient had bilateral breast MRI.  This measured the upper outer quadrant right breast mass at 2.3 cm including an area of surrounding non-masslike enhancement.  There was no evidence of multifocal or multicentric disease, no lymphadenopathy, and no findings in the left breast.  The patient's subsequent history is as detailed below.   PAST MEDICAL HISTORY: Past Medical History:  Diagnosis Date  . Blood dyscrasia    hx blood clott superficial in rt arm  . Breast cancer ([place]) 2019   Right Breast Cancer  . Cancer [facility]) 2019   right breast cancer  . Family history of breast cancer   . Heart murmur   . Hypertension   . Personal history of radiation therapy 2019   Right Breast Cancer  She used to have migraines. She denies GERD, asthma, emphysema. She was told that she had a slight heart murmur. She denies heart palpitations.    PAST SURGICAL HISTORY: Past Surgical History:  Procedure Laterality Date  . BREAST LUMPECTOMY Right 2019  . BREAST LUMPECTOMY WITH RADIOACTIVE SEED AND SENTINEL LYMPH NODE BIOPSY Right 03/18/2018   Procedure: RIGHT BREAST PARTIAL MASTECTOMY WITH RADIOACTIVE SEED AND SENTINEL LYMPH NODE BIOPSY;  Surgeon: [name], MD;  Location: [facility];  Service: General;  Laterality: Right;  . cesearean section     tubal ligation   . IR CV LINE INJECTION  08/28/2018  . IR IMAGING GUIDED PORT  INSERTION  09/04/2018  . IR REMOVAL TUN ACCESS W/ PORT W/O FL MOD SED  09/04/2018  . IR REMOVAL TUN ACCESS W/ PORT W/O FL MOD SED  01/16/2019  . PORTACATH PLACEMENT Left 05/27/2018   Procedure: INSERTION PORT-A-CATH;  Surgeon: [name], MD;  Location: [facility];  Service: General;  Laterality: Left;  . TUBAL LIGATION     2 Caesarian sections- tubal ligation at the second c-section.   FAMILY HISTORY Family History  Problem Relation Age of Onset  . Breast cancer Sister 9       approximate  . Cancer Father 28       started in shoulder, spread to spine, brain, lung  . Cancer Paternal [name]        'blood cancer'  . Heart attack Paternal Uncle   . Breast cancer Cousin        age dx unk  . Breast cancer Cousin        age dx unk  . Breast cancer Cousin        age dx unk   The patient's father passed away in the summer 2018 at the age of 25 due to metastatic lung cancer. He was a previous smoker. The patient's mother is alive at age 6. The patient has 1 brother and 1 sister. The patient's sister was diagnosed with breast cancer at the age of 80.  The patient has 3 paternal cousins diagnosed with breast cancer. She denies a family history of ovarian cancer.    GYNECOLOGIC HISTORY:  The patient's last menstrual period was in 05/2018 (days before chemotherapy started). Menarche: 54 years old. She used to be on a dance team.  Age at first live birth: 54 years old She is Anna Conrad. She was taking oral contraceptives, but this was discontinued as of January 2019.   She had bilateral tubal ligation at the time of her second c-section.    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  She has a PhD.  Her husband, [name], is a Chief Strategy Officer for [employer], making metal parts. He travels about 2-3 times per year. Their sons are [name] age 27 and [name] age 65 (as of November 2020).. She attends [employer].  ADVANCED DIRECTIVES:  In the absence of any documents to the contrary the patient's husband is automatically her healthcare power of attorney   HEALTH MAINTENANCE: Social History   Tobacco Use  . Smoking status: Never Smoker  . Smokeless tobacco: Never Used  Vaping Use  . Vaping Use: Never used  Substance Use Topics  . Alcohol use: Yes    Comment: occ  . Drug use: Never     Colonoscopy:n/a  PAP: 2017/ normal  Bone density: n/a   Allergies  Allergen Reactions  . Adhesive [Tape] Rash  . Latex Rash    Red  rash    Current Outpatient Medications  Medication Sig Dispense Refill  . gabapentin (NEURONTIN) 300 MG capsule Take 1 capsule (300 mg total) by mouth at bedtime. 90 capsule 4  . lisinopril-hydrochlorothiazide (PRINZIDE,ZESTORETIC) 10-12.5 MG tablet Take 1 tablet by mouth daily.     No current facility-administered medications for this visit.    OBJECTIVE: White woman in no acute distress  Vitals:   10/26/20 1036  BP: 123/81  Pulse: 64  Resp: 18  Temp: (!) 97.2 F (36.2 C)  SpO2: 98%     Body mass index is 33.94 kg/m.   Wt Readings from Last 3 Encounters:  10/26/20 197 lb 11.2 oz (89.7 kg)  10/27/19 196 lb (88.9 kg)  06/03/19 196 lb 14.4 oz (89.3 kg)   ECOG FS:1 - Symptomatic but completely ambulatory  Sclerae unicteric, EOMs intact Wearing a mask No cervical or supraclavicular adenopathy Lungs no rales or rhonchi Heart regular rate and rhythm Abd soft, nontender, positive bowel sounds MSK no focal spinal tenderness, no upper extremity lymphedema Neuro: nonfocal, well oriented, appropriate affect Breasts: Right breast is status post lumpectomy and radiation with no evidence of disease recurrence.  Left breast and both axillae are benign.  LAB RESULTS:  CMP     Component Value Date/Time   NA 141 06/02/2020 0502   K 3.4 (L) 06/02/2020 0502   CL 108 06/02/2020 0502   CO2 25 06/02/2020 0502   GLUCOSE 116 (H) 06/02/2020 0502   BUN 21 (H) 06/02/2020 0502   CREATININE  0.90 06/02/2020 0502   CREATININE 1.09 (H) 06/03/2019 1512   CALCIUM 8.4 (L) 06/02/2020 0502   PROT 6.9 06/02/2020 0502   ALBUMIN 4.1 06/02/2020 0502   AST 20 06/02/2020 0502   AST 21 06/03/2019 1512   ALT 17 06/02/2020 0502   ALT 17 06/03/2019 1512   ALKPHOS 46 06/02/2020 0502   BILITOT 0.6 06/02/2020 0502   BILITOT 0.2 (L) 06/03/2019 1512   GFRNONAA >60 06/02/2020 0502   GFRNONAA 59 (L) 06/03/2019 1512   GFRAA >60 06/02/2020 0502   GFRAA >60 06/03/2019 1512    Lab Results  Component Value Date   WBC 5.0 10/26/2020   NEUTROABS 2.9 10/26/2020   HGB 12.3 10/26/2020   HCT 37.1 10/26/2020   MCV 94.9 10/26/2020   PLT 232 10/26/2020   No results found for: LABCA2  No components found for: QASTMH962  No results for input(s): INR in the last 168 hours.  No results found for: LABCA2  No results found for: IWL798  No results found for: XQJ194  No results found for: RDE081  No results found for: CA2729  No components found for: HGQUANT  No results found for: CEA1 / No results found for: CEA1   No results found for: AFPTUMOR  No results found for: CHROMOGRNA  No results found for: TOTALPROTELP, ALBUMINELP, A1GS, A2GS, BETS, BETA2SER, GAMS, MSPIKE, SPEI (this displays SPEP labs)  No results found for: KPAFRELGTCHN, LAMBDASER, KAPLAMBRATIO (kappa/lambda light chains)  No results found for: HGBA, HGBA2QUANT, HGBFQUANT, HGBSQUAN (Hemoglobinopathy evaluation)   No results found for: LDH  No results found for: IRON, TIBC, IRONPCTSAT (Iron and TIBC)  No results found for: FERRITIN  Urinalysis    Component Value Date/Time   COLORURINE YELLOW 06/25/2018 0815   APPEARANCEUR HAZY (A) 06/25/2018 0815   LABSPEC 1.019 06/25/2018 0815   PHURINE 6.0 06/25/2018 0815   GLUCOSEU NEGATIVE 06/25/2018 0815   HGBUR NEGATIVE 06/25/2018 0815   BILIRUBINUR NEGATIVE 06/25/2018 0815   KETONESUR NEGATIVE 06/25/2018 0815   PROTEINUR NEGATIVE 06/25/2018 0815   NITRITE NEGATIVE  06/25/2018 0815   LEUKOCYTESUR NEGATIVE 06/25/2018 0815    STUDIES: No results found.    ELIGIBLE FOR AVAILABLE RESEARCH PROTOCOL: BCEP   ASSESSMENT: 55 y.o. [place], [place] woman status post right breast upper outer quadrant biopsy 02/07/2018 for a clinical T1c-T2 N0, stage IA-B invasive ductal carcinoma, grade 2, estrogen receptor and progesterone receptor positive, HER-2 not  amplified, with an MIB-1 of 10%  (1) status post right lumpectomy 03/18/2018 for a pT1c pN0, stage IA invasive ductal carcinoma, grade 2, estrogen and progesterone receptor positive, with negative margins.  A total of 1 lymph node was removed  (2) Mammaprint on 03/18/2018 showed high risk, indicating that with chemotherapy and hormone therapy, the patient would have a nearly 95% chance of having no distant metastases within the next 5 years  (3) chemotherapy consisting of doxorubicin and cyclophosphamide in dose dense fashion x4 started 06/04/2018, completed 07/23/2018 followed by weekly paclitaxel started 08/09/2018, discontinued 09/13/2018  (a) baseline echocardiogram on 05/23/2018 showed an ejection fraction in the 65-75% range  (b) adjuvant paclitaxel discontinued after 6 cycles because of neuropathy  (4) adjuvant radiation 10/16/2018 - 12/03/2018  Site/dose: The patient initially received a dose of 50.4 Gy in 28 fractions to the right breast using whole-breast tangent fields. This was delivered using a 3-D conformal technique. The patient then received a boost to the seroma. This delivered an additional 10 Gy in 5 fractions using 6x photons with a Complex Isodose technique. The total dose was 60.4 Gy.  (5) tamoxifen started January 2020  (6) genetics testing 03/13/2018 through the Common Hereditary Cancer Panel offered by [facility] ifound no deleterious mutations in APC, ATM, AXIN2, BARD1, BMPR1A, BRCA1, BRCA2, BRIP1, CDH1, CDKN2A (p14ARF), CDKN2A (p16INK4a), CKD4, CHEK2, CTNNA1, DICER1, EPCAM (Deletion/duplication  testing only), GREM1 (promoter region deletion/duplication testing only), KIT, MEN1, MLH1, MSH2, MSH3, MSH6, MUTYH, NBN, NF1, NHTL1, PALB2, PDGFRA, PMS2, POLD1, POLE, PTEN, RAD50, RAD51C, RAD51D, SDHB, SDHC, SDHD, SMAD4, SMARCA4. STK11, TP53, TSC1, TSC2, and VHL.  The following genes were evaluated for sequence changes only: SDHA and HOXB13 c.251G>A variant only.  (a)  A variant of uncertain significance (VUS) in the gene MSH3 was also identified c.1027+4T>C (Intronic).    PLAN: Anna Conrad is now 2-1/2 years out from definitive surgery for breast cancer with no evidence of disease recurrence.  This is very favorable.  She is tolerating tamoxifen well and the plan will be to continue that a total of 5 years.  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  She will see me again in 1 year.  She knows to call for any issue that may develop before then  Total encounter time 25 minutes.*    Arneshia Ade, Virgie Dad, MD  10/26/20 10:51 AM Medical Oncology and Hematology [facility] [place], [place] [address] Tel. [phone]    Fax. [phone]   I, Wilburn Mylar, am acting as scribe for Dr. Virgie Dad. Yvonne Stopher.  I, Lurline Del MD, have reviewed the above documentation for accuracy and completeness, and I agree with the above.   *Total Encounter Time as defined by the Centers for Medicare and Medicaid Services includes, in addition to the face-to-face time of a patient visit (documented in the note above) non-face-to-face time: obtaining and reviewing outside history, ordering and reviewing medications, tests or procedures, care coordination (communications with other health care professionals or caregivers) and documentation in the medical record.

## 2020-10-26 ENCOUNTER — Inpatient Hospital Stay: Payer: BC Managed Care – PPO

## 2020-10-26 ENCOUNTER — Inpatient Hospital Stay: Payer: BC Managed Care – PPO | Attending: Oncology | Admitting: Oncology

## 2020-10-26 ENCOUNTER — Other Ambulatory Visit: Payer: Self-pay

## 2020-10-26 VITALS — BP 123/81 | HR 64 | Temp 97.2°F | Resp 18 | Ht 64.0 in | Wt 197.7 lb

## 2020-10-26 DIAGNOSIS — Z170 Estrogen receptor positive status [ER+]: Secondary | ICD-10-CM | POA: Insufficient documentation

## 2020-10-26 DIAGNOSIS — Z79810 Long term (current) use of selective estrogen receptor modulators (SERMs): Secondary | ICD-10-CM | POA: Insufficient documentation

## 2020-10-26 DIAGNOSIS — C50411 Malignant neoplasm of upper-outer quadrant of right female breast: Secondary | ICD-10-CM | POA: Diagnosis not present

## 2020-10-26 LAB — CBC WITH DIFFERENTIAL/PLATELET
Abs Immature Granulocytes: 0.01 10*3/uL (ref 0.00–0.07)
Basophils Absolute: 0 10*3/uL (ref 0.0–0.1)
Basophils Relative: 1 %
Eosinophils Absolute: 0.2 10*3/uL (ref 0.0–0.5)
Eosinophils Relative: 4 %
HCT: 37.1 % (ref 36.0–46.0)
Hemoglobin: 12.3 g/dL (ref 12.0–15.0)
Immature Granulocytes: 0 %
Lymphocytes Relative: 28 %
Lymphs Abs: 1.4 10*3/uL (ref 0.7–4.0)
MCH: 31.5 pg (ref 26.0–34.0)
MCHC: 33.2 g/dL (ref 30.0–36.0)
MCV: 94.9 fL (ref 80.0–100.0)
Monocytes Absolute: 0.4 10*3/uL (ref 0.1–1.0)
Monocytes Relative: 8 %
Neutro Abs: 2.9 10*3/uL (ref 1.7–7.7)
Neutrophils Relative %: 59 %
Platelets: 232 10*3/uL (ref 150–400)
RBC: 3.91 MIL/uL (ref 3.87–5.11)
RDW: 12.4 % (ref 11.5–15.5)
WBC: 5 10*3/uL (ref 4.0–10.5)
nRBC: 0 % (ref 0.0–0.2)

## 2020-10-26 LAB — COMPREHENSIVE METABOLIC PANEL
ALT: 11 U/L (ref 0–44)
AST: 18 U/L (ref 15–41)
Albumin: 3.8 g/dL (ref 3.5–5.0)
Alkaline Phosphatase: 46 U/L (ref 38–126)
Anion gap: 7 (ref 5–15)
BUN: 12 mg/dL (ref 6–20)
CO2: 28 mmol/L (ref 22–32)
Calcium: 8.8 mg/dL — ABNORMAL LOW (ref 8.9–10.3)
Chloride: 106 mmol/L (ref 98–111)
Creatinine, Ser: 0.99 mg/dL (ref 0.44–1.00)
GFR, Estimated: >60 mL/min (ref >60)
Glucose, Bld: 90 mg/dL (ref 70–99)
Potassium: 4.3 mmol/L (ref 3.5–5.1)
Sodium: 141 mmol/L (ref 135–145)
Total Bilirubin: 0.6 mg/dL (ref 0.3–1.2)
Total Protein: 6.9 g/dL (ref 6.5–8.1)

## 2020-10-26 MED ORDER — TAMOXIFEN CITRATE 20 MG PO TABS: 20.0000 mg | ORAL_TABLET | Freq: Every day | ORAL | 12 refills | Status: AC

## 2020-10-28 ENCOUNTER — Telehealth: Payer: Self-pay | Admitting: Oncology

## 2020-10-28 NOTE — Telephone Encounter (Signed)
scheduled appts per 11/16 los. Pt confirmed appt date and time.

## 2021-03-30 ENCOUNTER — Ambulatory Visit (HOSPITAL_COMMUNITY)
Admission: RE | Admit: 2021-03-30 | Discharge: 2021-03-30 | Disposition: A | Discharge status: Home / Self Care | Payer: BC Managed Care – PPO | Source: Ambulatory Visit | Attending: Physical Medicine & Rehabilitation | Admitting: Physical Medicine & Rehabilitation

## 2021-03-30 ENCOUNTER — Other Ambulatory Visit (HOSPITAL_COMMUNITY): Payer: Self-pay | Admitting: Physical Medicine & Rehabilitation

## 2021-03-30 ENCOUNTER — Other Ambulatory Visit: Payer: Self-pay

## 2021-03-30 DIAGNOSIS — M79661 Pain in right lower leg: Secondary | ICD-10-CM | POA: Insufficient documentation

## 2021-03-30 DIAGNOSIS — M7989 Other specified soft tissue disorders: Secondary | ICD-10-CM | POA: Insufficient documentation

## 2021-03-30 NOTE — Progress Notes (Signed)
Positive DVT results called to [place] at Dr. Lenis Noon office. Patient informed of results and that someone would be contacting her about a prescription.    June Leap, BS, RDMS, RVT

## 2021-05-23 ENCOUNTER — Telehealth: Payer: Self-pay

## 2021-05-23 NOTE — Telephone Encounter (Signed)
Pt called stating she has been off of tamoxifen for one week and was advised by PCP to stay off for two weeks while taking Xarelto for DVT. Pt states they would re-evaluate if she needs to be on Xarelto long term then continue Tamoxifen after. Pt asks for advice about whether or not to start back on the Tamoxifen. This LPN informed pt I would consult with MD and someone would cal her. Pt verbalized thanks and understanding.

## 2021-05-24 ENCOUNTER — Telehealth: Payer: Self-pay | Admitting: *Deleted

## 2021-05-24 NOTE — Telephone Encounter (Signed)
Called & left message for pt to hold her tamoxifen for now & if she stays on xarelto long term can resume tamoxifen per Dr Geralyn Flash instructions.

## 2021-06-09 ENCOUNTER — Telehealth: Payer: Self-pay | Admitting: *Deleted

## 2021-06-09 NOTE — Telephone Encounter (Signed)
Pt is wanting to verify about resuming tamoxifen post development of DVT- she has been off for several weeks due since starting the xeralto .  Pt scheduled for follow up visit to discuss.

## 2021-06-10 ENCOUNTER — Other Ambulatory Visit: Payer: Self-pay

## 2021-06-10 ENCOUNTER — Encounter: Payer: Self-pay | Admitting: Adult Health

## 2021-06-10 ENCOUNTER — Inpatient Hospital Stay: Payer: BC Managed Care – PPO | Attending: Oncology | Admitting: Adult Health

## 2021-06-10 VITALS — BP 113/74 | HR 88 | Temp 97.2°F | Resp 18 | Ht 62.0 in | Wt 191.4 lb

## 2021-06-10 DIAGNOSIS — Z7901 Long term (current) use of anticoagulants: Secondary | ICD-10-CM | POA: Insufficient documentation

## 2021-06-10 DIAGNOSIS — Z170 Estrogen receptor positive status [ER+]: Secondary | ICD-10-CM | POA: Insufficient documentation

## 2021-06-10 DIAGNOSIS — Z79810 Long term (current) use of selective estrogen receptor modulators (SERMs): Secondary | ICD-10-CM | POA: Diagnosis not present

## 2021-06-10 DIAGNOSIS — C50411 Malignant neoplasm of upper-outer quadrant of right female breast: Secondary | ICD-10-CM | POA: Diagnosis present

## 2021-06-10 DIAGNOSIS — E2839 Other primary ovarian failure: Secondary | ICD-10-CM

## 2021-06-10 MED ORDER — ANASTROZOLE 1 MG PO TABS: 1.0000 mg | ORAL_TABLET | Freq: Every day | ORAL | 3 refills | Status: DC

## 2021-06-10 NOTE — Progress Notes (Signed)
[place]  Telephone:(336) [phone] Fax:(336) [phone]     ID: Anna Conrad DOB: 1965-12-18  MR#: [id number]  YYT#:[id number]  Patient Care Team: Filiberto Pinks as PCP - General (Physician Assistant) Magrinat, Virgie Dad, MD as Consulting Physician (Oncology) Kyung Rudd, MD as Consulting Physician (Radiation Oncology) [name], MD as Consulting Physician (General Surgery) Janyth Pupa, DO as Consulting Physician (Obstetrics and Gynecology) OTHER MD:  CHIEF COMPLAINT: Estrogen receptor positive breast cancer  CURRENT TREATMENT: Tamoxifen   INTERVAL HISTORY: Anna Conrad returns today for follow-up of her estrogen receptor positive breast cancer.   She continues on tamoxifen.  Hot flashes are minimal.  Vaginal dryness is not an issue.  She is stopped taking the nighttime gabapentin as it was not particularly helpful.  Since her last visit, she underwent bilateral diagnostic mammography with tomography at [place] on 09/22/2020 showing: breast density category C; no evidence of malignancy in either breast.    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  Some of the cancer mast and some not.  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   COVID 19 VACCINATION STATUS: Status post [employer] x2; no booster as of November 2021   HISTORY OF CURRENT ILLNESS: From the original intake note:  Anna Conrad had routine screening mammography on 01/29/2018 showing a possible mass with associated calcifications in the right breast. She underwent unilateral right diagnostic mammography with tomography and right breast ultrasonography at [facility] on 02/05/2018 showing a highly suspicious mass over the 10:30 position upper outer of the right breast located 6 cm from the nipple measuring 1.3 x 1.4 x 1.5 cm. Associated microcalcifications corresponding to the mammographic  abnormality. Ultrasound of the right axilla was normal.  Accordingly on 02/29/2019 she proceeded to biopsy of the right breast area in question. 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 Prognostic indicators significant for: estrogen receptor, 100% positive with strong staining intensity and progesterone receptor, 20% positive iwht moderate staining intensity. Proliferation marker Ki67 at 10%. HER2 not amplified with ratios HER2/CEP17 signals 1.17 and average HER2 copies per cell 2.45.  On 02/21/2018 the patient had bilateral breast MRI.  This measured the upper outer quadrant right breast mass at 2.3 cm including an area of surrounding non-masslike enhancement.  There was no evidence of multifocal or multicentric disease, no lymphadenopathy, and no findings in the left breast.  The patient's subsequent history is as detailed below.   PAST MEDICAL HISTORY: Past Medical History:  Diagnosis Date   Blood dyscrasia    hx blood clott superficial in rt arm   Breast cancer ([place]) 2019   Right Breast Cancer   Cancer [facility]) 2019   right breast cancer   Family history of breast cancer    Heart murmur    Hypertension    Personal history of radiation therapy 2019   Right Breast Cancer  She used to have migraines. She denies GERD, asthma, emphysema. She was told that she had a slight heart murmur. She denies heart palpitations.    PAST SURGICAL HISTORY: Past Surgical History:  Procedure Laterality Date   BREAST LUMPECTOMY Right 2019   BREAST LUMPECTOMY WITH RADIOACTIVE SEED AND SENTINEL LYMPH NODE BIOPSY Right 03/18/2018   Procedure: RIGHT BREAST PARTIAL MASTECTOMY WITH RADIOACTIVE SEED AND SENTINEL LYMPH NODE BIOPSY;  Surgeon: [name], MD;  Location: [place];  Service: General;  Laterality: Right;   cesearean section     tubal ligation    IR CV LINE INJECTION  08/28/2018   IR IMAGING GUIDED PORT INSERTION  09/04/2018    IR REMOVAL TUN ACCESS W/ PORT W/O FL MOD SED  09/04/2018   IR REMOVAL TUN ACCESS W/ PORT W/O FL MOD SED  01/16/2019   PORTACATH PLACEMENT Left 05/27/2018   Procedure: INSERTION PORT-A-CATH;  Surgeon: [name], MD;  Location: [place];  Service: General;  Laterality: Left;   TUBAL LIGATION     2 Caesarian sections- tubal ligation at the second c-section.   FAMILY HISTORY Family History  Problem Relation Age of Onset   Breast cancer Sister 56       approximate   Cancer Father 33       started in shoulder, spread to spine, brain, lung   Cancer Paternal Uncle        'blood cancer'   Heart attack Paternal Uncle    Breast cancer Cousin        age dx unk   Breast cancer Cousin        age dx unk   Breast cancer Cousin        age dx unk   The patient's father passed away in the summer 2018 at the age of 49 due to metastatic lung cancer. He was a previous smoker. The patient's mother is alive at age 73. The patient has 1 brother and 1 sister. The patient's sister was diagnosed with breast cancer at the age of 79.  The patient has 3 paternal cousins diagnosed with breast cancer. She denies a family history of ovarian cancer.    GYNECOLOGIC HISTORY:  The patient's last menstrual period was in 05/2018 (days before chemotherapy started). Menarche: 55 years old. She used to be on a dance team.  Age at first live birth: 55 years old She is [place] P2. She was taking oral contraceptives, but this was discontinued as of January 2019.   She had bilateral tubal ligation at the time of her second c-section.    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  She has a PhD.  Her husband, [name], is a Chief Strategy Officer for [employer], making metal parts. He travels about 2-3 times per year. Their sons are [name] age 41 and [name] age 89 (as of November 2020).. She attends [employer].  ADVANCED DIRECTIVES: In the absence of any documents  to the contrary the patient's husband is automatically her healthcare power of attorney   HEALTH MAINTENANCE: Social History   Tobacco Use   Smoking status: Never   Smokeless tobacco: Never  Vaping Use   Vaping Use: Never used  Substance Use Topics   Alcohol use: Yes    Comment: occ   Drug use: Never     Colonoscopy:n/a  PAP: 2017/ normal  Bone density: n/a   Allergies  Allergen Reactions   Adhesive [Tape] Rash   Latex Rash    Red  rash    Current Outpatient Medications  Medication Sig Dispense Refill   anastrozole (ARIMIDEX) 1 MG tablet Take 1 tablet (1 mg total) by mouth daily. 90 tablet 3   lisinopril-hydrochlorothiazide (PRINZIDE,ZESTORETIC) 10-12.5 MG tablet Take 1 tablet by mouth daily.     No current facility-administered medications for this visit.    OBJECTIVE: White woman in no acute distress  Vitals:   06/10/21 1548  BP: 113/74  Pulse: 88  Resp: 18  Temp: (!) 97.2 F (36.2 C)  SpO2: 97%     Body mass index is 35 kg/m.   Wt Readings from Last 3 Encounters:  06/10/21 191 lb 6 oz (86.8 kg)  10/26/20 197 lb 11.2 oz (89.7 kg)  10/27/19 196 lb (88.9 kg)   ECOG FS:1 - Symptomatic but completely ambulatory  Sclerae unicteric, EOMs intact Wearing a mask No cervical or supraclavicular adenopathy Lungs no rales or rhonchi Heart regular rate and rhythm Abd soft, nontender, positive bowel sounds MSK no focal spinal tenderness, no upper extremity lymphedema Neuro: nonfocal, well oriented, appropriate affect Breasts: Right breast is status post lumpectomy and radiation with no evidence of disease recurrence.  Left breast and both axillae are benign.  LAB RESULTS:  CMP     Component Value Date/Time   NA 141 10/26/2020 1012   K 4.3 10/26/2020 1012   CL 106 10/26/2020 1012   CO2 28 10/26/2020 1012   GLUCOSE 90 10/26/2020 1012   BUN 12 10/26/2020 1012   CREATININE 0.99 10/26/2020 1012   CREATININE 1.09 (H) 06/03/2019 1512   CALCIUM 8.8 (L)  10/26/2020 1012   PROT 6.9 10/26/2020 1012   ALBUMIN 3.8 10/26/2020 1012   AST 18 10/26/2020 1012   AST 21 06/03/2019 1512   ALT 11 10/26/2020 1012   ALT 17 06/03/2019 1512   ALKPHOS 46 10/26/2020 1012   BILITOT 0.6 10/26/2020 1012   BILITOT 0.2 (L) 06/03/2019 1512   GFRNONAA >60 10/26/2020 1012   GFRNONAA 59 (L) 06/03/2019 1512   GFRAA >60 06/02/2020 0502   GFRAA >60 06/03/2019 1512    Lab Results  Component Value Date   WBC 5.0 10/26/2020   NEUTROABS 2.9 10/26/2020   HGB 12.3 10/26/2020   HCT 37.1 10/26/2020   MCV 94.9 10/26/2020   PLT 232 10/26/2020   No results found for: LABCA2  No components found for: SVXBLT903  No results for input(s): INR in the last 168 hours.  No results found for: LABCA2  No results found for: ESP233  No results found for: AQT622  No results found for: QJF354  No results found for: CA2729  No components found for: HGQUANT  No results found for: CEA1 / No results found for: CEA1   No results found for: AFPTUMOR  No results found for: CHROMOGRNA  No results found for: TOTALPROTELP, ALBUMINELP, A1GS, A2GS, BETS, BETA2SER, GAMS, MSPIKE, SPEI (this displays SPEP labs)  No results found for: KPAFRELGTCHN, LAMBDASER, KAPLAMBRATIO (kappa/lambda light chains)  No results found for: HGBA, HGBA2QUANT, HGBFQUANT, HGBSQUAN (Hemoglobinopathy evaluation)   No results found for: LDH  No results found for: IRON, TIBC, IRONPCTSAT (Iron and TIBC)  No results found for: FERRITIN  Urinalysis    Component Value Date/Time   COLORURINE YELLOW 06/25/2018 0815   APPEARANCEUR HAZY (A) 06/25/2018 0815   LABSPEC 1.019 06/25/2018 0815   PHURINE 6.0 06/25/2018 0815   GLUCOSEU NEGATIVE 06/25/2018 0815   HGBUR NEGATIVE 06/25/2018 0815   BILIRUBINUR NEGATIVE 06/25/2018 0815   KETONESUR NEGATIVE 06/25/2018 0815   PROTEINUR NEGATIVE 06/25/2018 0815   NITRITE NEGATIVE 06/25/2018 0815   LEUKOCYTESUR NEGATIVE 06/25/2018 0815    STUDIES: No  results found.    ELIGIBLE FOR AVAILABLE RESEARCH PROTOCOL: BCEP   ASSESSMENT: 55 y.o. [place], [place] woman status post right breast upper outer quadrant biopsy 02/07/2018 for a clinical T1c-T2 N0, stage IA-B invasive ductal carcinoma, grade 2, estrogen receptor and progesterone receptor positive, HER-2 not amplified, with an MIB-1 of 10%  (  1) status post right lumpectomy 03/18/2018 for a pT1c pN0, stage IA invasive ductal carcinoma, grade 2, estrogen and progesterone receptor positive, with negative margins.  A total of 1 lymph node was removed  (2) Mammaprint on 03/18/2018 showed high risk, indicating that with chemotherapy and hormone therapy, the patient would have a nearly 95% chance of having no distant metastases within the next 5 years  (3) chemotherapy consisting of doxorubicin and cyclophosphamide in dose dense fashion x4 started 06/04/2018, completed 07/23/2018 followed by weekly paclitaxel started 08/09/2018, discontinued 09/13/2018  (a) baseline echocardiogram on 05/23/2018 showed an ejection fraction in the 65-75% range  (b) adjuvant paclitaxel discontinued after 6 cycles because of neuropathy  (4) adjuvant radiation 10/16/2018 - 12/03/2018  Site/dose: The patient initially received a dose of 50.4 Gy in 28 fractions to the right breast using whole-breast tangent fields. This was delivered using a 3-D conformal technique. The patient then received a boost to the seroma. This delivered an additional 10 Gy in 5 fractions using 6x photons with a Complex Isodose technique. The total dose was 60.4 Gy.  (5) tamoxifen started January 2020  (A) developed a DVT 04/2021 and was started on Xarelto--discontinued tamoxifen at that time  (B) LMP 2019 when undergoing chemotherapy  (C) Anastrozole started on 06/10/2021  (6) genetics testing 03/13/2018 through the Common Hereditary Cancer Panel offered by [facility] ifound no deleterious mutations in APC, ATM, AXIN2, BARD1, BMPR1A, BRCA1, BRCA2, BRIP1, CDH1,  CDKN2A (p14ARF), CDKN2A (p16INK4a), CKD4, CHEK2, CTNNA1, DICER1, EPCAM (Deletion/duplication testing only), GREM1 (promoter region deletion/duplication testing only), KIT, MEN1, MLH1, MSH2, MSH3, MSH6, MUTYH, NBN, NF1, NHTL1, PALB2, PDGFRA, PMS2, POLD1, POLE, PTEN, RAD50, RAD51C, RAD51D, SDHB, SDHC, SDHD, SMAD4, SMARCA4. STK11, TP53, TSC1, TSC2, and VHL.  The following genes were evaluated for sequence changes only: SDHA and HOXB13 c.251G>A variant only.  (a)  A variant of uncertain significance (VUS) in the gene MSH3 was also identified c.1027+4T>C (Intronic).    PLAN: Anna Conrad is doing well today.  I reviewed that her blood clot was likely secondary to the tamoxifen and we could consider it a provoked DVT.  She meets criteria for post-menopausal because she has gone greater than 1 year without a menstrual cycle.    She was recommended the following:   Permanently discontinue Tamoxifen Start Anastrozole (risks and benefits reviewed in detail). Bone density testing ordered for [facility] Continue Xarelto daily  Repeat doppler of her left leg after 6 months of Xarelto to evaluate the status of her DVT.    Anna Conrad agrees with this plan.  She already has a lab and f/u with Dr. Jana Hakim in 10/2021.  Her mammogram is due in October.  She knows to call for any questions that may arise between now and her next appointment.  We are happy to see her sooner if needed.   Total encounter time 20 minutes.* in chart review, discussion in person with Dr. Jana Hakim, face to face visit time, order entry, and documentation of this encounter.   Wilber Bihari, NP 06/10/21 9:19 PM Medical Oncology and Hematology [facility] [place], [place] [address] Tel. [phone]    Fax. [phone]  *Total Encounter Time as defined by the Centers for Medicare and Medicaid Services includes, in addition to the face-to-face time of a patient visit (documented in the note above) non-face-to-face time:  obtaining and reviewing outside history, ordering and reviewing medications, tests or procedures, care coordination (communications with other health care professionals or caregivers) and documentation in the  medical record.

## 2021-06-23 ENCOUNTER — Other Ambulatory Visit: Payer: Self-pay

## 2021-06-23 ENCOUNTER — Ambulatory Visit (HOSPITAL_BASED_OUTPATIENT_CLINIC_OR_DEPARTMENT_OTHER)
Admission: RE | Admit: 2021-06-23 | Discharge: 2021-06-23 | Disposition: A | Discharge status: Home / Self Care | Payer: BC Managed Care – PPO | Source: Ambulatory Visit | Attending: Adult Health | Admitting: Adult Health

## 2021-06-23 DIAGNOSIS — E2839 Other primary ovarian failure: Secondary | ICD-10-CM | POA: Diagnosis not present

## 2021-06-24 ENCOUNTER — Telehealth: Payer: Self-pay

## 2021-06-24 NOTE — Telephone Encounter (Signed)
RN notified patient, patient verbalized understanding.  No further needs at this time.

## 2021-06-24 NOTE — Telephone Encounter (Signed)
-----   Message from Gardenia Phlegm, NP sent at 06/24/2021 12:13 PM EDT ----- Please call patient and inform her that her bone density is normal! ----- Message ----- From: Interface, Rad Results In Sent: 06/24/2021  11:28 AM EDT To: Gardenia Phlegm, NP

## 2021-07-19 NOTE — Progress Notes (Signed)
Subjective:    CC: L knee pain  I, Anna Conrad, LAT, ATC, am serving as scribe for Dr. Lynne Leader.  HPI: Pt is a 55 y/o female presenting w/ c/o L knee pain x one month w/ no known MOI.  She locates her pain to her L ant-med and L post-lat knee.  She denies any injury.  L knee swelling: yes L knee mechanical symptoms: no Aggravating factors: climbing stairs; any rotational movement; increased pain upon first standing from a seated or resting position Treatments tried: Voltaren gel; Advil; Tylenol; knee brace  Pertinent review of Systems: No fevers or chills  Relevant historical information: Breast cancer requiring lumpectomy chemotherapy and radiation with Po therapy originally treated with tamoxifen and subsequently thought to cause a DVT now with anastrozole   Objective:    Vitals:   07/20/21 0838  BP: 104/76  Pulse: 73  SpO2: 97%   General: Well Developed, well nourished, and in no acute distress.   MSK: Left knee mild effusion normal motion without crepitation.  Mildly tender palpation medial joint line. Stable ligamentous exam. Intact strength.  Lab and Radiology Results  Procedure: Real-time Ultrasound Guided Injection of left knee superior lateral patellar space Device: Philips Affiniti 50G Images permanently stored and available for review in PACS Ultrasound evaluation prior to injection reveals mild knee effusion.  Mild narrowing medial compartment.  No Baker's cyst. Verbal informed consent obtained.  Discussed risks and benefits of procedure. Warned about infection bleeding damage to structures skin hypopigmentation and fat atrophy among others. Patient expresses understanding and agreement Time-out conducted.   Noted no overlying erythema, induration, or other signs of local infection.   Skin prepped in a sterile fashion.   Local anesthesia: Topical Ethyl chloride.   With sterile technique and under real time ultrasound guidance: 40 mg of Kenalog and  2 mL of Marcaine injected into knee joint. Fluid seen entering the joint capsule.   Completed without difficulty   Pain immediately resolved suggesting accurate placement of the medication.   Advised to call if fevers/chills, erythema, induration, drainage, or persistent bleeding.   Images permanently stored and available for review in the ultrasound unit.  Impression: Technically successful ultrasound guided injection.    X-ray images left knee obtained today personally and independently interpreted Mild patellofemoral DJD.  Minimal medial lateral DJD.  No acute fractures.  No lytic or aggressive appearing bony lesions. Await formal radiology review   Impression and Recommendations:    Assessment and Plan: 55 y.o. female with left knee pain ongoing for about a month.  Etiology somewhat unclear.  Pain thought to be related to exacerbation of DJD or possible medial meniscus injury.  Plan for steroid injection and Voltaren gel and referral to PT for quad strengthening.  Recheck back in about 6 weeks.  If not improved could proceed to hyaluronic acid injections or MRI.Marland Kitchen  PDMP not reviewed this encounter. Orders Placed This Encounter  Procedures   [place] LIMITED JOINT SPACE STRUCTURES LOW LEFT(NO LINKED CHARGES)    Order Specific Question:   Reason for Exam (SYMPTOM  OR DIAGNOSIS REQUIRED)    Answer:   L knee pain    Order Specific Question:   Preferred imaging location?    Answer:   [facility]   DG Knee AP/LAT W/[place] Left    Standing Status:   Future    Number of Occurrences:   1    Standing Expiration Date:   07/20/2022    Order Specific  Question:   Reason for Exam (SYMPTOM  OR DIAGNOSIS REQUIRED)    Answer:   eval left knee pain    Order Specific Question:   Is patient pregnant?    Answer:   No    Order Specific Question:   Preferred imaging location?    Answer:   [facility]   Ambulatory referral to Physical Therapy    Referral Priority:    Routine    Referral Type:   Physical Medicine    Referral Reason:   Specialty Services Required    Requested Specialty:   Physical Therapy    Number of Visits Requested:   1   No orders of the defined types were placed in this encounter.   Discussed warning signs or symptoms. Please see discharge instructions. Patient expresses understanding.   The above documentation has been reviewed and is accurate and complete Lynne Leader, M.D.

## 2021-07-20 ENCOUNTER — Other Ambulatory Visit: Payer: Self-pay

## 2021-07-20 ENCOUNTER — Ambulatory Visit (INDEPENDENT_AMBULATORY_CARE_PROVIDER_SITE_OTHER): Payer: BC Managed Care – PPO

## 2021-07-20 ENCOUNTER — Ambulatory Visit: Payer: BC Managed Care – PPO | Admitting: Family Medicine

## 2021-07-20 ENCOUNTER — Encounter: Payer: Self-pay | Admitting: Family Medicine

## 2021-07-20 ENCOUNTER — Ambulatory Visit: Payer: Self-pay

## 2021-07-20 VITALS — BP 104/76 | HR 73 | Ht 62.0 in | Wt 194.6 lb

## 2021-07-20 DIAGNOSIS — M25562 Pain in left knee: Secondary | ICD-10-CM

## 2021-07-20 NOTE — Patient Instructions (Addendum)
Thank you for coming in today.   You had a L knee injection today.  Call or go to the ER if you develop a large red swollen joint with extreme pain or oozing puss.   Please get an Xray today before you leave   Please use Voltaren gel (Generic Diclofenac Gel) up to 4x daily for pain as needed.  This is available over-the-counter as both the name brand Voltaren gel and the generic diclofenac gel.   Return in 6 weeks. Let me know if you dont do well.

## 2021-07-22 NOTE — Progress Notes (Signed)
Left knee x-ray shows minimal to mild arthritis changes

## 2021-07-25 ENCOUNTER — Ambulatory Visit (INDEPENDENT_AMBULATORY_CARE_PROVIDER_SITE_OTHER): Payer: BC Managed Care – PPO | Admitting: Physical Therapy

## 2021-07-25 ENCOUNTER — Encounter: Payer: Self-pay | Admitting: Physical Therapy

## 2021-07-25 ENCOUNTER — Other Ambulatory Visit: Payer: Self-pay

## 2021-07-25 DIAGNOSIS — M25662 Stiffness of left knee, not elsewhere classified: Secondary | ICD-10-CM | POA: Diagnosis not present

## 2021-07-25 DIAGNOSIS — M6281 Muscle weakness (generalized): Secondary | ICD-10-CM

## 2021-07-25 DIAGNOSIS — R262 Difficulty in walking, not elsewhere classified: Secondary | ICD-10-CM

## 2021-07-25 DIAGNOSIS — M25562 Pain in left knee: Secondary | ICD-10-CM | POA: Diagnosis not present

## 2021-07-25 NOTE — Therapy (Addendum)
[facility] Physical Therapy [address] [place], [place], [address] Phone: [phone]   Fax:  [phone]  Physical Therapy Evaluation Discharge  Patient Details  Name: Anna Conrad MRN: [id number] Date of Birth: 1965/12/13 Referring Provider (PT): Lynne Leader, MD   Encounter Date: 07/25/2021   PT End of Session - 07/25/21 0813     Visit Number 1    Number of Visits 8    Date for PT Re-Evaluation 09/23/21    Authorization Type [employer] Pt has a $52 co-pay    PT Start Time 0808    PT Stop Time 0846    PT Time Calculation (min) 38 min    Activity Tolerance Patient tolerated treatment well    Behavior During Therapy [facility] for tasks assessed/performed             Past Medical History:  Diagnosis Date   Blood dyscrasia    hx blood clott superficial in rt arm   Breast cancer ([place]) 2019   Right Breast Cancer   Cancer ([place]) 2019   right breast cancer   Family history of breast cancer    Heart murmur    Hypertension    Personal history of radiation therapy 2019   Right Breast Cancer    Past Surgical History:  Procedure Laterality Date   BREAST LUMPECTOMY Right 2019   BREAST LUMPECTOMY WITH RADIOACTIVE SEED AND SENTINEL LYMPH NODE BIOPSY Right 03/18/2018   Procedure: RIGHT BREAST PARTIAL MASTECTOMY WITH RADIOACTIVE SEED AND SENTINEL LYMPH NODE BIOPSY;  Surgeon: [name], MD;  Location: [place];  Service: General;  Laterality: Right;   cesearean section     tubal ligation    IR CV LINE INJECTION  08/28/2018   IR IMAGING GUIDED PORT INSERTION  09/04/2018   IR REMOVAL TUN ACCESS W/ PORT W/O FL MOD SED  09/04/2018   IR REMOVAL TUN ACCESS W/ PORT W/O FL MOD SED  01/16/2019   PORTACATH PLACEMENT Left 05/27/2018   Procedure: INSERTION PORT-A-CATH;  Surgeon: [name], MD;  Location: [place];  Service: General;  Laterality: Left;   TUBAL LIGATION      There were no vitals filed for this visit.    Subjective Assessment -  07/25/21 0814     Subjective Pt reporting h/o breast cancer in 2019 and during and after treatment she noticied knee pain. Pt stating h/o blood clot in right calf and bakers cyst in right knee. Pt stating injection helped her right knee pain. Pt reporting increased weight gain over the last 3 years and she feels that isn't helping her pain. Pt has 2 young boys and wants to be active with them.    Pertinent History h/o breast cancer with lumpectomy s/p chemo and radiation, HTN, heart murmur    Limitations Walking;House hold activities;Other (comment);Standing    Diagnostic tests X-ray    Patient Stated Goals walking and be active with my two boys    Currently in Pain? Yes    Pain Score 2     Pain Location Knee    Pain Orientation Left    Pain Descriptors / Indicators Other (Comment)   "tinge"   Pain Type Acute pain    Pain Radiating Towards from knee to lateral hip    Pain Onset 1 to 4 weeks ago    Pain Frequency Intermittent    Aggravating Factors  bending, prolonged walking, stair climbing    Pain Relieving Factors injection recently, over the counter pain meds  Effect of Pain on Daily Activities difficulty with bending, household chores, climbing stairs are difficult                [facility] PT Assessment - 07/25/21 0001       Assessment   Medical Diagnosis M25.562 acute left knee pain    Referring Provider (PT) Lynne Leader, MD    Hand Dominance Right    Prior Therapy no      Precautions   Precautions None      Restrictions   Weight Bearing Restrictions No      Balance Screen   Has the patient fallen in the past 6 months Yes    How many times? 1   hurt left ankle a little   Has the patient had a decrease in activity level because of a fear of falling?  No    Is the patient reluctant to leave their home because of a fear of falling?  No      Home Social worker Private residence    Living Arrangements Spouse/significant other;Children    Available  Help at [place] Two level    Alternate Level Stairs-Rails Right    Additional Comments split level home with rails on right and left depending on where you are coming into house      Prior Function   Level of Independence Independent    Vocation Full time employment    Museum/gallery curator    Leisure wants to get to gym program      Cognition   Overall Cognitive Status Within Functional Limits for tasks assessed      Observation/Other Assessments   Focus on Therapeutic Outcomes (FOTO)  63% (predicted 75%)      Posture/Postural Control   Posture/Postural Control Postural limitations    Postural Limitations Rounded Shoulders;Forward head      ROM / Strength   AROM / PROM / Strength AROM;Strength      AROM   AROM Assessment Site Knee    Right/Left Knee Right;Left    Right Knee Extension 0    Right Knee Flexion 130    Left Knee Extension 0    Left Knee Flexion 126      Strength   Strength Assessment Site Knee;Hip    Right/Left Hip Right;Left    Right Hip Flexion 5/5    Right Hip Extension 5/5    Right Hip ABduction 5/5    Right Hip ADduction 5/5    Left Hip Flexion 5/5    Left Hip Extension 5/5    Left Hip ABduction 5/5    Left Hip ADduction 5/5    Right/Left Knee Left;Right    Right Knee Flexion 5/5    Right Knee Extension 5/5    Left Knee Flexion 4+/5    Left Knee Extension 4+/5      Flexibility   Soft Tissue Assessment /Muscle Length yes    Hamstrings Rt: 75 degrees, Left: 70 degrees      Palpation   Patella mobility tracking WNL bilaterally    Palpation comment TTP: lateral joint line of left knee      Ambulation/Gait   Assistive device None    Gait Pattern Step-through pattern                        Objective measurements completed on examination: See above findings.       [facility] Adult PT  Treatment/Exercise - 07/25/21 0001       Exercises   Exercises Knee/Hip      Knee/Hip Exercises: Stretches    Active Hamstring Stretch Left;30 seconds    ITB Stretch 30 seconds;1 rep      Knee/Hip Exercises: Seated   Long Arc Quad Strengthening;Left;5 reps    Long Arc Quad Limitations green theraband    Hamstring Curl Strengthening;Left;5 reps;Limitations    Hamstring Limitations green theraband    Sit to [employer] with UE support      Knee/Hip Exercises: Supine   Bridges Strengthening;Both;10 reps    Straight Leg Raises Strengthening;Left;10 reps      Knee/Hip Exercises: Sidelying   Hip ABduction Strengthening;Left;10 reps                    PT Education - 07/25/21 0811     Education Details PT POC    Person(s) Educated Patient    Methods Explanation;Demonstration;Tactile cues;Handout;Verbal cues    Comprehension Returned demonstration;Verbalized understanding              PT Short Term Goals - 07/25/21 0814       PT SHORT TERM GOAL #1   Title pt will be independent in her initial HEP    Time 3    Period Weeks    Status New    Target Date 08/19/21      PT SHORT TERM GOAL #2   Title Pt will be able to report walking 10 minutes with pain </= 4/10 in left knee.    Time 4    Period Weeks    Status New    Target Date 08/25/21               PT Long Term Goals - 07/25/21 0910       PT LONG TERM GOAL #1   Title Pt will be independent in her advanced HEP/gym program.    Time 8    Period Weeks    Status New    Target Date 09/23/21      PT LONG TERM GOAL #2   Title pt will improve her FOTO from 64% to >/= 75%.    Time 8    Period Weeks    Status New    Target Date 09/23/21      PT LONG TERM GOAL #3   Title Pt will increase her left knee strength to 5/5 to improve functional mobility.    Time 8    Period Weeks    Status New    Target Date 09/23/21      PT LONG TERM GOAL #4   Title Pt will be able to demonstrate ascending and descending 2 flights of stairs with no hand rail with pain </= 2/10.    Time 8    Period Weeks    Status New    Target Date  09/23/21      PT LONG TERM GOAL #5   Title pt will be able to report walking 30 minutes on communtiy surfaces with left knee pain </= 2/10.    Time 8    Period Weeks    Status New    Target Date 09/23/21                    Plan - 07/25/21 0902     Clinical Impression Statement Pt presenting today for evaluation of left knee pain which began about 1 month ago. Pt has been treated for  a Baker's cyst on the right and DVT on right lower leg. Pt with lateral joint line pain on the left. Only mild weakness noted in left knee compared to left and normal acitve ROM from 0-126 degrees. Pt was edu in HEP and stretching and instructed to return in 1-2 weeks for follow up. Due to pt's high co-pay pt's visits may be limited. Pt able to return demontration of all exercises. Pt's goal is to progress to a gym program at the local [employer] and return to biking and hiking with her family. Skilled PT needed to address pt's impairments with the below interventions.    Personal Factors and Comorbidities Comorbidity 3+    Comorbidities breast cancer s/p radiatio and chemo, HTN, heart murmur    Examination-Activity Limitations Other;Lift;Squat;Stairs    Examination-Participation Restrictions Community Activity;Yard Work;Occupation    Stability/Clinical Decision Making Stable/Uncomplicated    Clinical Decision Making Low    Rehab Potential Good    PT Frequency 1x / week    PT Duration 8 weeks    PT Treatment/Interventions ADLs/Self Care Home Management;Cryotherapy;Electrical Stimulation;Iontophoresis 63m/ml Dexamethasone;Moist Heat;Balance training;Therapeutic exercise;Therapeutic activities;Functional mobility training;Stair training;Gait training;Neuromuscular re-education;Patient/family education;Passive range of motion;Manual techniques;Dry needling;Taping    PT Next Visit Plan bike, LE strengtheing progression,steps,  update HEP as needed    PT Home Exercise Plan Access Code: [email or web address]  URL:  [email or web address]  Date: 07/25/2021  Prepared by: JKearney Hard   Exercises  Supine Bridge - 2 x daily - 7 x weekly - 2 sets - 10 reps  Supine Active Straight Leg Raise - 2 x daily - 7 x weekly - 2 sets - 10 reps  Hooklying Hamstring Stretch with Strap - 2 x daily - 7 x weekly - 3 reps - 30 seconds hold  Supine ITB Stretch with Strap - 2 x daily - 7 x weekly - 3 reps - 30 seconds hold  Sidelying Hip Abduction - 2 x daily - 7 x weekly - 2 sets - 10 reps  Sitting Knee Extension with Resistance - 2 x daily - 7 x weekly - 2 sets - 10 reps  Seated Hamstring Curl with Anchored Resistance - 2 x daily - 7 x weekly - 2 sets - 10 reps    Consulted and Agree with Plan of Care Patient             Patient will benefit from skilled therapeutic intervention in order to improve the following deficits and impairments:  Pain, Postural dysfunction, Increased edema, Decreased strength, Decreased mobility, Decreased activity tolerance, Decreased balance, Difficulty walking  Visit Diagnosis: Acute pain of left knee  Difficulty in walking, not elsewhere classified  Stiffness of left knee, not elsewhere classified  Muscle weakness (generalized)   PHYSICAL THERAPY DISCHARGE SUMMARY  Visits from Start of Care: 1  Current functional level related to goals / functional outcomes: See above   Remaining deficits: See above   Education / Equipment: HEP   Patient agrees to discharge. Patient goals were not met. Patient is being discharged due to not returning since the last visit.   Problem List Patient Active Problem List   Diagnosis Date Noted   Port-A-Cath in place 06/04/2018   Genetic testing 03/14/2018   Malignant neoplasm of upper-outer quadrant of right breast in female, estrogen receptor positive ([place] [place] 03/05/2018   Family history of breast cancer     JOretha Caprice PT, MPT 07/25/2021, 9:18 AM  [facility] [address]  [place] [address]  Phone: [phone]   Fax:  [phone]  Name: Asyia Hornung MRN: [id number] Date of Birth: 13-Dec-1965

## 2021-07-25 NOTE — Patient Instructions (Signed)
Access Code: [email or web address]  URL: [email or web address]  Date: 07/25/2021  Prepared by: Kearney Hard    Exercises  Supine Bridge - 2 x daily - 7 x weekly - 2 sets - 10 reps  Supine Active Straight Leg Raise - 2 x daily - 7 x weekly - 2 sets - 10 reps  Hooklying Hamstring Stretch with Strap - 2 x daily - 7 x weekly - 3 reps - 30 seconds hold  Supine ITB Stretch with Strap - 2 x daily - 7 x weekly - 3 reps - 30 seconds hold  Sidelying Hip Abduction - 2 x daily - 7 x weekly - 2 sets - 10 reps  Sitting Knee Extension with Resistance - 2 x daily - 7 x weekly - 2 sets - 10 reps  Seated Hamstring Curl with Anchored Resistance - 2 x daily - 7 x weekly - 2 sets - 10 reps

## 2021-08-11 ENCOUNTER — Encounter: Payer: BC Managed Care – PPO | Admitting: Physical Therapy

## 2021-08-30 NOTE — Progress Notes (Signed)
   I, Wendy Poet, LAT, ATC, am serving as scribe for Dr. Lynne Leader.  Anna Conrad is a 55 y.o. female who presents to [place] [facility] today for f/u of L knee pain.  She was last seen by Dr. Georgina Snell on 07/20/21 and had a L knee injection.  She was also referred to PT of which she completed one session and was d/c.  Today, pt reports L knee started hurting again yesterday, but overall it's been feeling a lot better. No swelling noted.   Diagnostic testing: L knee XR- 07/20/21  Pertinent review of systems: No fevers or chills  Relevant historical information: Breast cancer history   Exam:  BP 110/76   Pulse 79   Ht 5\' 2"  (1.575 m)   Wt 194 lb 6.4 oz (88.2 kg)   LMP 05/10/2018 (Within Days)   SpO2 97%   BMI 35.56 kg/m  General: Well Developed, well nourished, and in no acute distress.   MSK: Left knee normal motion normal gait.    Lab and Radiology Results  EXAM: LEFT KNEE 3 VIEWS   COMPARISON:  None.   FINDINGS: No evidence of fracture, dislocation, or joint effusion. Joint spaces are preserved with tiny marginal osteophytes. Soft tissues are unremarkable.   IMPRESSION: Minimal degenerative changes without acute findings.     Electronically Signed   By: Davina Poke D.O.   On: 07/21/2021 10:06  I, Lynne Leader, personally (independently) visualized and performed the interpretation of the images attached in this note.     Assessment and Plan: 55 y.o. female with left knee pain thought to be due to degenerative meniscus tear and likely.  Patient had great benefit with steroid injection.  She had 1 episode of physical therapy and has been doing home exercise program since pretty regularly approximately 3 days a week approximately 20 minutes at a time.  She notes physical therapy is expensive for her so she has shifted to home exercise program. The pain is returned a little bit.  Discussed options.  Plan for a bit of watchful waiting  with continued home exercise program and Voltaren gel.  If pain worsens or significantly changes next step would probably be MRI to further characterize cause of pain.  Alternatively could repeat steroid injection as soon as around November 10.  Patient will let me know.    Discussed warning signs or symptoms. Please see discharge instructions. Patient expresses understanding.   The above documentation has been reviewed and is accurate and complete Lynne Leader, M.D.

## 2021-08-31 ENCOUNTER — Other Ambulatory Visit: Payer: Self-pay

## 2021-08-31 ENCOUNTER — Ambulatory Visit: Payer: BC Managed Care – PPO | Admitting: Family Medicine

## 2021-08-31 VITALS — BP 110/76 | HR 79 | Ht 62.0 in | Wt 194.4 lb

## 2021-08-31 DIAGNOSIS — M25562 Pain in left knee: Secondary | ICD-10-CM | POA: Diagnosis not present

## 2021-08-31 NOTE — Patient Instructions (Addendum)
Thank you for coming in today.   Work on the [employer]   Next step is MRI if needed.   Let me know.  I can order that test with a phone call or a mychart message.   I could also repeat a cortisone injection anytime on or after around Nov 10.    Recheck as needed.

## 2021-09-22 ENCOUNTER — Other Ambulatory Visit: Payer: Self-pay | Admitting: Oncology

## 2021-09-22 DIAGNOSIS — Z9889 Other specified postprocedural states: Secondary | ICD-10-CM

## 2021-09-22 DIAGNOSIS — Z853 Personal history of malignant neoplasm of breast: Secondary | ICD-10-CM

## 2021-09-30 ENCOUNTER — Telehealth: Payer: Self-pay | Admitting: *Deleted

## 2021-09-30 NOTE — Telephone Encounter (Signed)
Anna Conrad states she has been taking Anastrozole for ~1 month. Over the last 2-3 weeks she has noticed considerable hair loss. "It's on the floor, our food and everywhere, even my son has noticed it". Discussed that this is a common side effect.  Wants to know if there is an alternative. Next appt is 11/22 with Dr Jana Hakim

## 2021-10-08 ENCOUNTER — Encounter: Payer: Self-pay | Admitting: Oncology

## 2021-10-12 ENCOUNTER — Inpatient Hospital Stay: Payer: BC Managed Care – PPO | Attending: Oncology

## 2021-10-12 ENCOUNTER — Other Ambulatory Visit: Payer: Self-pay | Admitting: *Deleted

## 2021-10-12 ENCOUNTER — Other Ambulatory Visit: Payer: Self-pay

## 2021-10-12 DIAGNOSIS — Z79811 Long term (current) use of aromatase inhibitors: Secondary | ICD-10-CM | POA: Insufficient documentation

## 2021-10-12 DIAGNOSIS — L659 Nonscarring hair loss, unspecified: Secondary | ICD-10-CM

## 2021-10-12 DIAGNOSIS — Z170 Estrogen receptor positive status [ER+]: Secondary | ICD-10-CM | POA: Diagnosis not present

## 2021-10-12 DIAGNOSIS — C50411 Malignant neoplasm of upper-outer quadrant of right female breast: Secondary | ICD-10-CM | POA: Diagnosis not present

## 2021-10-12 DIAGNOSIS — Z86718 Personal history of other venous thrombosis and embolism: Secondary | ICD-10-CM | POA: Diagnosis not present

## 2021-10-12 DIAGNOSIS — Z7901 Long term (current) use of anticoagulants: Secondary | ICD-10-CM | POA: Diagnosis not present

## 2021-10-12 LAB — CBC WITH DIFFERENTIAL (CANCER CENTER ONLY)
Abs Immature Granulocytes: 0.01 10*3/uL (ref 0.00–0.07)
Basophils Absolute: 0 10*3/uL (ref 0.0–0.1)
Basophils Relative: 1 %
Eosinophils Absolute: 0.2 10*3/uL (ref 0.0–0.5)
Eosinophils Relative: 3 %
HCT: 34.6 % — ABNORMAL LOW (ref 36.0–46.0)
Hemoglobin: 11.9 g/dL — ABNORMAL LOW (ref 12.0–15.0)
Immature Granulocytes: 0 %
Lymphocytes Relative: 43 %
Lymphs Abs: 2.5 10*3/uL (ref 0.7–4.0)
MCH: 32.4 pg (ref 26.0–34.0)
MCHC: 34.4 g/dL (ref 30.0–36.0)
MCV: 94.3 fL (ref 80.0–100.0)
Monocytes Absolute: 0.5 10*3/uL (ref 0.1–1.0)
Monocytes Relative: 9 %
Neutro Abs: 2.5 10*3/uL (ref 1.7–7.7)
Neutrophils Relative %: 44 %
Platelet Count: 236 10*3/uL (ref 150–400)
RBC: 3.67 MIL/uL — ABNORMAL LOW (ref 3.87–5.11)
RDW: 12.9 % (ref 11.5–15.5)
WBC Count: 5.7 10*3/uL (ref 4.0–10.5)
nRBC: 0 % (ref 0.0–0.2)

## 2021-10-12 LAB — CMP (CANCER CENTER ONLY)
ALT: 86 U/L — ABNORMAL HIGH (ref 0–44)
AST: 49 U/L — ABNORMAL HIGH (ref 15–41)
Albumin: 4 g/dL (ref 3.5–5.0)
Alkaline Phosphatase: 59 U/L (ref 38–126)
Anion gap: 7 (ref 5–15)
BUN: 13 mg/dL (ref 6–20)
CO2: 25 mmol/L (ref 22–32)
Calcium: 8.5 mg/dL — ABNORMAL LOW (ref 8.9–10.3)
Chloride: 107 mmol/L (ref 98–111)
Creatinine: 0.95 mg/dL (ref 0.44–1.00)
GFR, Estimated: >60 mL/min (ref >60)
Glucose, Bld: 99 mg/dL (ref 70–99)
Potassium: 3.8 mmol/L (ref 3.5–5.1)
Sodium: 139 mmol/L (ref 135–145)
Total Bilirubin: 0.5 mg/dL (ref 0.3–1.2)
Total Protein: 6.7 g/dL (ref 6.5–8.1)

## 2021-10-12 LAB — VITAMIN D 25 HYDROXY (VIT D DEFICIENCY, FRACTURES): Vit D, 25-Hydroxy: 24.4 ng/mL — ABNORMAL LOW (ref 30–100)

## 2021-10-13 ENCOUNTER — Inpatient Hospital Stay: Payer: BC Managed Care – PPO

## 2021-10-13 LAB — FOLLICLE STIMULATING HORMONE: FSH: 54 m[IU]/mL

## 2021-10-13 LAB — LUTEINIZING HORMONE: LH: 30.9 m[IU]/mL

## 2021-10-26 ENCOUNTER — Encounter: Payer: Self-pay | Admitting: Oncology

## 2021-10-31 NOTE — Progress Notes (Signed)
[place]  [phone] Fax:([phone]) [phone]     ID: Anna Conrad DOB: 18-Nov-1966  MR#: [id number]  ZWC#:[id number]  Patient Care Team: Filiberto Pinks as PCP - General (Physician Assistant) Kemari Mares, Virgie Dad, MD as Consulting Physician (Oncology) Kyung Rudd, MD as Consulting Physician (Radiation Oncology) [name], MD as Consulting Physician (General Surgery) Janyth Pupa, DO as Consulting Physician (Obstetrics and Gynecology) OTHER MD:  CHIEF COMPLAINT: Estrogen receptor positive breast cancer  CURRENT TREATMENT: Tamoxifen   INTERVAL HISTORY: Alvera returns today for follow-up of her estrogen receptor positive breast cancer.   She was switched to anastrozole at her last visit on 06/10/2021 due to recent DVT.  She is tolerating this generally well.  She has some joint pain in her hips and knees.  She does not have small joint discomfort or fibromyalgia-like discomfort as may develop with anastrozole.  Since her last visit, she underwent bone density screening on 06/23/2021 showing a T-score of -0.6, which is considered normal.  She also underwent bilateral diagnostic mammography with tomography at [facility] earlier today showing: breast density category C; no evidence of malignancy in either breast.  However 2 lymph nodes with normal highlight were noted on ultrasonography in the left axilla and repeat unilateral left mammography has been rescheduled for 02/02/2022.  Note that she had vaccination for hep B and tetanus in the left side within the last week and also for COVID on the left side within the last 3 weeks.    REVIEW OF SYSTEMS: Briseis is having some hip and knee pain as noted.  She has seen sports medicine and given some instructions on exercises for the quads and back.  She has gained a little bit of weight and that concerns her.  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  A detailed review of systems today was otherwise stable.   COVID 19 VACCINATION STATUS: Status post Pfizer x3 as of November 2022  HISTORY OF CURRENT ILLNESS: From the original intake note:  Anna Conrad had routine screening mammography on 01/29/2018 showing a possible mass with associated calcifications in the right breast. She underwent unilateral right diagnostic mammography with tomography and right breast ultrasonography at [facility] on 02/05/2018 showing a highly suspicious mass over the 10:30 position upper outer of the right breast located 6 cm from the nipple measuring 1.3 x 1.4 x 1.5 cm. Associated microcalcifications corresponding to the mammographic abnormality. Ultrasound of the right axilla was normal.  Accordingly on 02/29/2019 she proceeded to biopsy of the right breast area in question. 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 Prognostic indicators significant for: estrogen receptor, 100% positive with strong staining intensity and progesterone receptor, 20% positive iwht moderate staining intensity. Proliferation marker Ki67 at 10%. HER2 not amplified with ratios HER2/CEP17 signals 1.17 and average HER2 copies per cell 2.45.  On 02/21/2018 the patient had bilateral breast MRI.  This measured the upper outer quadrant right breast mass at 2.3 cm including an area of surrounding non-masslike enhancement.  There was no evidence of multifocal or multicentric disease, no lymphadenopathy, and no findings in the left breast.  The patient's subsequent history is as detailed below.   PAST MEDICAL HISTORY: Past Medical History:  Diagnosis Date   Blood dyscrasia    hx blood clott superficial in rt arm   Breast cancer ([place]) 2019   Right Breast Cancer   Cancer [place])  2019   right breast cancer   Family history of breast cancer    Heart murmur    Hypertension    Personal history of radiation therapy 2019   Right Breast Cancer  She used to have migraines. She denies GERD, asthma, emphysema. She was told that she had a slight heart murmur. She denies heart palpitations.    PAST SURGICAL HISTORY: Past Surgical History:  Procedure Laterality Date   BREAST LUMPECTOMY Right 2019   BREAST LUMPECTOMY WITH RADIOACTIVE SEED AND SENTINEL LYMPH NODE BIOPSY Right 03/18/2018   Procedure: RIGHT BREAST PARTIAL MASTECTOMY WITH RADIOACTIVE SEED AND SENTINEL LYMPH NODE BIOPSY;  Surgeon: [name], MD;  Location: [facility];  Service: General;  Laterality: Right;   cesearean section     tubal ligation    IR CV LINE INJECTION  08/28/2018   IR IMAGING GUIDED PORT INSERTION  09/04/2018   IR REMOVAL TUN ACCESS W/ PORT W/O FL MOD SED  09/04/2018   IR REMOVAL TUN ACCESS W/ PORT W/O FL MOD SED  01/16/2019   PORTACATH PLACEMENT Left 05/27/2018   Procedure: INSERTION PORT-A-CATH;  Surgeon: [name], MD;  Location: [facility];  Service: General;  Laterality: Left;   TUBAL LIGATION     2 Caesarian sections- tubal ligation at the second c-section.   FAMILY HISTORY Family History  Problem Relation Age of Onset   Breast cancer Sister 53       approximate   Cancer Father 53       started in shoulder, spread to spine, brain, lung   Cancer Paternal Uncle        'blood cancer'   Heart attack Paternal Uncle    Breast cancer Cousin        age dx unk   Breast cancer Cousin        age dx unk   Breast cancer Cousin        age dx unk  The patient's father passed away in the summer 2018 at the age of 71 due to metastatic lung cancer. He was a previous smoker. The patient's mother is alive at age 57. The patient has 1 brother and 1 sister. The patient's sister was diagnosed with breast cancer at the age of 18.  The patient has 3 paternal cousins diagnosed with  breast cancer. She denies a family history of ovarian cancer.    GYNECOLOGIC HISTORY:  The patient's last menstrual period was in 05/2018 (days before chemotherapy started). Menarche: 55 years old. She used to be on a dance team.  Age at first live birth: 54 years old She is Louise P2. She was taking oral contraceptives, but this was discontinued as of January 2019.   She had bilateral tubal ligation at the time of her second c-section.    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  She has a PhD.  Her husband, [name], worked as a Chief Strategy Officer for [employer], making metal parts.  He is currently not able to work because of severe back problems.  Their sons are [name] age 55 and [name] age 16 (as of November 2020). She attends [employer].  ADVANCED DIRECTIVES: In the absence of any documents to the contrary the patient's husband is automatically her healthcare power  of attorney   HEALTH MAINTENANCE: Social History   Tobacco Use   Smoking status: Never   Smokeless tobacco: Never  Vaping Use   Vaping Use: Never used  Substance Use Topics   Alcohol use: Yes    Comment: occ   Drug use: Never     Colonoscopy:n/a  PAP: 2017/ normal  Bone density: n/a   Allergies  Allergen Reactions   Adhesive [Tape] Rash   Latex Rash    Red  rash    Current Outpatient Medications  Medication Sig Dispense Refill   anastrozole (ARIMIDEX) 1 MG tablet Take 1 tablet (1 mg total) by mouth daily. 90 tablet 3   lisinopril-hydrochlorothiazide (PRINZIDE,ZESTORETIC) 10-12.5 MG tablet Take 1 tablet by mouth daily.     XARELTO 20 MG TABS tablet Take 20 mg by mouth daily.     No current facility-administered medications for this visit.    OBJECTIVE: White woman in no acute distress  Vitals:   11/01/21 1057  BP: 133/90  Pulse: 74  Resp: 16  Temp: 97.6 F (36.4 C)  SpO2: 100%     Body mass index is 35.21 kg/m.   Wt Readings from Last 3  Encounters:  11/01/21 192 lb 8 oz (87.3 kg)  08/31/21 194 lb 6.4 oz (88.2 kg)  07/20/21 194 lb 9.6 oz (88.3 kg)   ECOG FS:1 - Symptomatic but completely ambulatory  Sclerae unicteric, EOMs intact Wearing a mask No cervical or supraclavicular adenopathy Lungs no rales or rhonchi Heart regular rate and rhythm Abd soft, nontender, positive bowel sounds MSK no focal spinal tenderness, no upper extremity lymphedema Neuro: nonfocal, well oriented, appropriate affect Breasts: The right breast is status postlumpectomy and radiation.  There is no evidence of disease recurrence.  The left breast is benign.  Both axillae are benign.   LAB RESULTS:  CMP     Component Value Date/Time   NA 139 10/12/2021 1514   K 3.8 10/12/2021 1514   CL 107 10/12/2021 1514   CO2 25 10/12/2021 1514   GLUCOSE 99 10/12/2021 1514   BUN 13 10/12/2021 1514   CREATININE 0.95 10/12/2021 1514   CALCIUM 8.5 (L) 10/12/2021 1514   PROT 6.7 10/12/2021 1514   ALBUMIN 4.0 10/12/2021 1514   AST 49 (H) 10/12/2021 1514   ALT 86 (H) 10/12/2021 1514   ALKPHOS 59 10/12/2021 1514   BILITOT 0.5 10/12/2021 1514   GFRNONAA >60 10/12/2021 1514   GFRAA >60 06/02/2020 0502   GFRAA >60 06/03/2019 1512    Lab Results  Component Value Date   WBC 5.7 10/12/2021   NEUTROABS 2.5 10/12/2021   HGB 11.9 (L) 10/12/2021   HCT 34.6 (L) 10/12/2021   MCV 94.3 10/12/2021   PLT 236 10/12/2021   No results found for: LABCA2  No components found for: VZCHYI502  No results for input(s): INR in the last 168 hours.  No results found for: LABCA2  No results found for: DXA128  No results found for: NOM767  No results found for: MCN470  No results found for: CA2729  No components found for: HGQUANT  No results found for: CEA1 / No results found for: CEA1   No results found for: AFPTUMOR  No results found for: CHROMOGRNA  No results found for: TOTALPROTELP, ALBUMINELP, A1GS, A2GS, BETS, BETA2SER, GAMS, MSPIKE, SPEI (this  displays SPEP labs)  No results found for: KPAFRELGTCHN, LAMBDASER, KAPLAMBRATIO (kappa/lambda light chains)  No results found for: HGBA, HGBA2QUANT, HGBFQUANT, HGBSQUAN (Hemoglobinopathy evaluation)   No results  found for: LDH  No results found for: IRON, TIBC, IRONPCTSAT (Iron and TIBC)  No results found for: FERRITIN  Urinalysis    Component Value Date/Time   COLORURINE YELLOW 06/25/2018 0815   APPEARANCEUR HAZY (A) 06/25/2018 0815   LABSPEC 1.019 06/25/2018 0815   PHURINE 6.0 06/25/2018 0815   GLUCOSEU NEGATIVE 06/25/2018 0815   HGBUR NEGATIVE 06/25/2018 0815   BILIRUBINUR NEGATIVE 06/25/2018 0815   KETONESUR NEGATIVE 06/25/2018 0815   PROTEINUR NEGATIVE 06/25/2018 0815   NITRITE NEGATIVE 06/25/2018 0815   LEUKOCYTESUR NEGATIVE 06/25/2018 0815    STUDIES: MM DIAG BREAST TOMO BILATERAL  Result Date: 11/01/2021 CLINICAL DATA:  55 year old female presenting for routine annual surveillance status post right breast lumpectomy in 2019. The patient had tetanus and hepatitis-B vaccinations in the left arm 1 week ago and a COVID vaccination in the left arm 3 weeks ago. EXAM: DIGITAL DIAGNOSTIC BILATERAL MAMMOGRAM WITH TOMOSYNTHESIS AND CAD; [place] AXILLARY LEFT TECHNIQUE: Bilateral digital diagnostic mammography and breast tomosynthesis was performed. The images were evaluated with computer-aided detection.; Targeted ultrasound examination of the left axilla was performed. COMPARISON:  Previous exam(s). ACR Breast Density Category c: The breast tissue is heterogeneously dense, which may obscure small masses. FINDINGS: There is a lymph node seen in the left axilla which has increased in cortical thickness from the prior exams. The right breast lumpectomy site is stable. No suspicious calcifications, masses or areas of distortion are seen in the bilateral breasts. Ultrasound targeted to the left axilla demonstrates 2 prominent lymph nodes, however there cortical thickness measures within  normal limits. IMPRESSION: 1. Interval increase in thickness of a left axillary lymph node, best appreciated on the mammogram images. This is likely related to multiple vaccinations which have been injected into the left shoulder. 2. Stable right breast lumpectomy site. No mammographic evidence of malignancy in the bilateral breasts. RECOMMENDATION: Three-month follow-up diagnostic left mammogram to evaluate the enlarged low left axillary lymph node. If the lymph node has clearly decreased in size compared to today's exam, ultrasound is not necessary. The patient states she does have more vaccinations scheduled, and I encouraged her to have those in the right shoulder if possible. I have discussed the findings and recommendations with the patient. If applicable, a reminder letter will be sent to the patient regarding the next appointment. BI-RADS CATEGORY  3: Probably benign. Electronically Signed   By: Ammie Ferrier M.D.   On: 11/01/2021 08:31  [place] AXILLA LEFT  Result Date: 11/01/2021 CLINICAL DATA:  55 year old female presenting for routine annual surveillance status post right breast lumpectomy in 2019. The patient had tetanus and hepatitis-B vaccinations in the left arm 1 week ago and a COVID vaccination in the left arm 3 weeks ago. EXAM: DIGITAL DIAGNOSTIC BILATERAL MAMMOGRAM WITH TOMOSYNTHESIS AND CAD; [place] AXILLARY LEFT TECHNIQUE: Bilateral digital diagnostic mammography and breast tomosynthesis was performed. The images were evaluated with computer-aided detection.; Targeted ultrasound examination of the left axilla was performed. COMPARISON:  Previous exam(s). ACR Breast Density Category c: The breast tissue is heterogeneously dense, which may obscure small masses. FINDINGS: There is a lymph node seen in the left axilla which has increased in cortical thickness from the prior exams. The right breast lumpectomy site is stable. No suspicious calcifications, masses or areas of distortion are seen in the  bilateral breasts. Ultrasound targeted to the left axilla demonstrates 2 prominent lymph nodes, however there cortical thickness measures within normal limits. IMPRESSION: 1. Interval increase in thickness of a left axillary lymph node, best  appreciated on the mammogram images. This is likely related to multiple vaccinations which have been injected into the left shoulder. 2. Stable right breast lumpectomy site. No mammographic evidence of malignancy in the bilateral breasts. RECOMMENDATION: Three-month follow-up diagnostic left mammogram to evaluate the enlarged low left axillary lymph node. If the lymph node has clearly decreased in size compared to today's exam, ultrasound is not necessary. The patient states she does have more vaccinations scheduled, and I encouraged her to have those in the right shoulder if possible. I have discussed the findings and recommendations with the patient. If applicable, a reminder letter will be sent to the patient regarding the next appointment. BI-RADS CATEGORY  3: Probably benign. Electronically Signed   By: Ammie Ferrier M.D.   On: 11/01/2021 08:31     ELIGIBLE FOR AVAILABLE RESEARCH PROTOCOL: BCEP   ASSESSMENT: 56 y.o. [place], [place] woman status post right breast upper outer quadrant biopsy 02/07/2018 for a clinical T1c-T2 N0, stage IA-B invasive ductal carcinoma, grade 2, estrogen receptor and progesterone receptor positive, HER-2 not amplified, with an MIB-1 of 10%  (1) status post right lumpectomy 03/18/2018 for a pT1c pN0, stage IA invasive ductal carcinoma, grade 2, estrogen and progesterone receptor positive, with negative margins.  A total of 1 lymph node was removed  (2) Mammaprint on 03/18/2018 showed high risk, indicating that with chemotherapy and hormone therapy, the patient would have a nearly 95% chance of having no distant metastases within the next 5 years  (3) chemotherapy consisting of doxorubicin and cyclophosphamide in dose dense fashion x4  started 06/04/2018, completed 07/23/2018 followed by weekly paclitaxel started 08/09/2018, discontinued 09/13/2018  (a) baseline echocardiogram on 05/23/2018 showed an ejection fraction in the 65-75% range  (b) adjuvant paclitaxel discontinued after 6 cycles because of neuropathy  (4) adjuvant radiation 10/16/2018 - 12/03/2018  Site/dose: The patient initially received a dose of 50.4 Gy in 28 fractions to the right breast using whole-breast tangent fields. This was delivered using a 3-D conformal technique. The patient then received a boost to the seroma. This delivered an additional 10 Gy in 5 fractions using 6x photons with a Complex Isodose technique. The total dose was 60.4 Gy.  (5) tamoxifen started January 2020  (A) developed a right lower extremity DVT 03/2021 and was started on Xarelto--discontinued tamoxifen at that time  (B) last menstrual period 2019 when undergoing chemotherapy  (C) anastrozole started on 06/10/2021   (1) FSH 54 and LH 30.9 on 10/12/2021  (6) genetics testing 03/13/2018 through the Common Hereditary Cancer Panel offered by [facility] ifound no deleterious mutations in APC, ATM, AXIN2, BARD1, BMPR1A, BRCA1, BRCA2, BRIP1, CDH1, CDKN2A (p14ARF), CDKN2A (p16INK4a), CKD4, CHEK2, CTNNA1, DICER1, EPCAM (Deletion/duplication testing only), GREM1 (promoter region deletion/duplication testing only), KIT, MEN1, MLH1, MSH2, MSH3, MSH6, MUTYH, NBN, NF1, NHTL1, PALB2, PDGFRA, PMS2, POLD1, POLE, PTEN, RAD50, RAD51C, RAD51D, SDHB, SDHC, SDHD, SMAD4, SMARCA4. STK11, TP53, TSC1, TSC2, and VHL.  The following genes were evaluated for sequence changes only: SDHA and HOXB13 c.251G>A variant only.  (a)  A variant of uncertain significance (VUS) in the gene MSH3 was also identified c.1027+4T>C (Intronic).   PLAN: Ndidi is now 3 and half years out from definitive surgery for her breast cancer with no evidence of disease recurrence.  This is very favorable.  She is tolerating anastrozole generally  well and the plan is to continue that to a minimum of 5 years of antiestrogens.  She has been on rivaroxaban since April 2022.  The question is whether  her DVT was provoked or not.  Unprovoked DVTs we continue anticoagulation indefinitely.  In her case I would call it provoked since she was on tamoxifen which is known to increase the risk of clotting.  We have taken her off tamoxifen and will continue her on anastrozole and therefore presumably the procoagulant challenge has been removed.  Accordingly I would be comfortable with her discontinuing rivaroxaban.  She tells me she is going to be discussing this with her primary care physician in January.  They will make the definitive decision at that point.  Otherwise she will see [place] again in June.  She knows to call for any other issue admittable before then.  Total encounter time 25 minutes.Sarajane Jews C. Lindalee Huizinga, MD 11/01/21 4:52 PM Medical Oncology and Hematology [facility] [place], [place] [address] Tel. [phone]    Fax. [phone]   I, Wilburn Mylar, am acting as scribe for Dr. Virgie Dad. Lajuane Leatham.  I, Lurline Del MD, have reviewed the above documentation for accuracy and completeness, and I agree with the above.    *Total Encounter Time as defined by the Centers for Medicare and Medicaid Services includes, in addition to the face-to-face time of a patient visit (documented in the note above) non-face-to-face time: obtaining and reviewing outside history, ordering and reviewing medications, tests or procedures, care coordination (communications with other health care professionals or caregivers) and documentation in the medical record.

## 2021-11-01 ENCOUNTER — Other Ambulatory Visit: Payer: Self-pay | Admitting: Oncology

## 2021-11-01 ENCOUNTER — Ambulatory Visit
Admission: RE | Admit: 2021-11-01 | Discharge: 2021-11-01 | Disposition: A | Discharge status: Home / Self Care | Payer: BC Managed Care – PPO | Source: Ambulatory Visit | Attending: Oncology | Admitting: Oncology

## 2021-11-01 ENCOUNTER — Inpatient Hospital Stay: Payer: BC Managed Care – PPO

## 2021-11-01 ENCOUNTER — Inpatient Hospital Stay (HOSPITAL_BASED_OUTPATIENT_CLINIC_OR_DEPARTMENT_OTHER): Payer: BC Managed Care – PPO | Admitting: Oncology

## 2021-11-01 ENCOUNTER — Other Ambulatory Visit: Payer: Self-pay

## 2021-11-01 VITALS — BP 133/90 | HR 74 | Temp 97.6°F | Resp 16 | Ht 62.0 in | Wt 192.5 lb

## 2021-11-01 DIAGNOSIS — Z853 Personal history of malignant neoplasm of breast: Secondary | ICD-10-CM

## 2021-11-01 DIAGNOSIS — Z9889 Other specified postprocedural states: Secondary | ICD-10-CM

## 2021-11-01 DIAGNOSIS — Z170 Estrogen receptor positive status [ER+]: Secondary | ICD-10-CM | POA: Diagnosis not present

## 2021-11-01 DIAGNOSIS — C50411 Malignant neoplasm of upper-outer quadrant of right female breast: Secondary | ICD-10-CM | POA: Diagnosis not present

## 2021-11-01 MED ORDER — VITAMIN D 25 MCG (1000 UNIT) PO TABS: 1000.0000 [IU] | ORAL_TABLET | Freq: Every day | ORAL | 4 refills | Status: DC

## 2022-01-31 ENCOUNTER — Other Ambulatory Visit: Payer: Self-pay | Admitting: Hematology and Oncology

## 2022-01-31 DIAGNOSIS — Z9889 Other specified postprocedural states: Secondary | ICD-10-CM

## 2022-01-31 DIAGNOSIS — Z853 Personal history of malignant neoplasm of breast: Secondary | ICD-10-CM

## 2022-02-02 ENCOUNTER — Ambulatory Visit
Admission: RE | Admit: 2022-02-02 | Discharge: 2022-02-02 | Disposition: A | Discharge status: Home / Self Care | Payer: BC Managed Care – PPO | Source: Ambulatory Visit | Attending: Oncology | Admitting: Oncology

## 2022-02-02 ENCOUNTER — Ambulatory Visit
Admission: RE | Admit: 2022-02-02 | Discharge: 2022-02-02 | Disposition: A | Discharge status: Home / Self Care | Payer: BC Managed Care – PPO | Source: Ambulatory Visit | Attending: Hematology and Oncology | Admitting: Hematology and Oncology

## 2022-02-02 ENCOUNTER — Other Ambulatory Visit: Payer: Self-pay | Admitting: Hematology and Oncology

## 2022-02-02 DIAGNOSIS — Z853 Personal history of malignant neoplasm of breast: Secondary | ICD-10-CM

## 2022-02-02 DIAGNOSIS — Z9889 Other specified postprocedural states: Secondary | ICD-10-CM

## 2022-04-28 ENCOUNTER — Other Ambulatory Visit: Payer: Self-pay | Admitting: Family Medicine

## 2022-04-28 ENCOUNTER — Ambulatory Visit
Admission: RE | Admit: 2022-04-28 | Discharge: 2022-04-28 | Disposition: A | Discharge status: Home / Self Care | Payer: BC Managed Care – PPO | Source: Ambulatory Visit | Attending: Family Medicine | Admitting: Family Medicine

## 2022-04-28 DIAGNOSIS — M170 Bilateral primary osteoarthritis of knee: Secondary | ICD-10-CM

## 2022-05-16 NOTE — Progress Notes (Signed)
Patient Care Team: Scifres, Earlie Server, PA-C (Inactive) as PCP - General (Physician Assistant) Magrinat, Virgie Dad, MD (Inactive) as Consulting Physician (Oncology) Kyung Rudd, MD as Consulting Physician (Radiation Oncology) [name], MD as Consulting Physician (General Surgery) Janyth Pupa, DO as Consulting Physician (Obstetrics and Gynecology) Gregor Hams, MD as Consulting Physician (Family Medicine)  DIAGNOSIS:  Encounter Diagnosis  Name Primary?   Malignant neoplasm of upper-outer quadrant of right breast in female, estrogen receptor positive ([place])     SUMMARY OF ONCOLOGIC HISTORY: Oncology History  Malignant neoplasm of upper-outer quadrant of right breast in female, estrogen receptor positive ([place])  03/05/2018 Initial Diagnosis   status post right breast upper outer quadrant biopsy 02/07/2018 for a clinical T1c-T2 N0, stage IA-B invasive ductal carcinoma, grade 2, estrogen receptor and progesterone receptor positive, HER-2 not amplified, with an MIB-1 of 10%   03/13/2018 Genetic Testing   The Common Hereditary Cancer Panel offered by [facility] includes sequencing and/or deletion duplication testing of the following 47 genes: APC, ATM, AXIN2, BARD1, BMPR1A, BRCA1, BRCA2, BRIP1, CDH1, CDKN2A (p14ARF), CDKN2A (p16INK4a), CKD4, CHEK2, CTNNA1, DICER1, EPCAM (Deletion/duplication testing only), GREM1 (promoter region deletion/duplication testing only), KIT, MEN1, MLH1, MSH2, MSH3, MSH6, MUTYH, NBN, NF1, NHTL1, PALB2, PDGFRA, PMS2, POLD1, POLE, PTEN, RAD50, RAD51C, RAD51D, SDHB, SDHC, SDHD, SMAD4, SMARCA4. STK11, TP53, TSC1, TSC2, and VHL.  The following genes were evaluated for sequence changes only: SDHA and HOXB13 c.251G>A variant only.  Results: No pathogenic variants identified. A variant of uncertain significance (VUS) in the gene MSH3 was also identified c.1027+4T>C (Intronic).  VUS's should not be used to impact medical management.  The date of this test report is 03/13/2018.     03/18/2018 Surgery   status post right lumpectomy for a pT1c pN0, stage IA invasive ductal carcinoma, grade 2, estrogen and progesterone receptor positive, with negative margins.  A total of 1 lymph node was removed  Mammaprint showed high risk, indicating that with chemotherapy and hormone therapy, the patient would have a nearly 95% chance of having no distant metastases within the next 5 years   06/04/2018 - 09/13/2018 Adjuvant Chemotherapy   chemotherapy consisting of doxorubicin and cyclophosphamide in dose dense fashion x4 started 06/04/2018, completed 07/23/2018 followed by weekly paclitaxel started 08/09/2018, discontinued 09/13/2018             (a) baseline echocardiogram on 05/23/2018 showed an ejection fraction in the 65-75% range             (b) adjuvant paclitaxel discontinued after 6 cycles because of neuropathy   10/16/2018 - 12/03/2018 Radiation Therapy   Site/dose: The patient initially received a dose of 50.4 Gy in 28 fractions to the right breast using whole-breast tangent fields. This was delivered using a 3-D conformal technique. The patient then received a boost to the seroma. This delivered an additional 10 Gy in 5 fractions using 6x photons with a Complex Isodose technique. The total dose was 60.4 Gy.     12/2018 -  Anti-estrogen oral therapy   Tamoxifen daily switched to anastrozole when she developed blood clot July 2022     CHIEF COMPLIANT: Follow up breast cancer on Anastrozole  and establish care with Dr Lindi Adie.  INTERVAL HISTORY: Anna Conrad is a 56 y.o. with the above mention follow-up on Anastrozole. She presents to the clinic today for a follow-up and establish care with Dr. Lindi Adie. She states that she was loosing a lot of hair. States that it was coming from natural menopause. States that  she formed a blood clot, but states that she is wearing compression socks. States that she has mild hot flashes but it passes but it's manageable. States that she has pain  in knees and ankles. States that she had an x-rays. States hat she has some pain and discomfort in her breast when she moves it.   ALLERGIES:  is allergic to adhesive [tape] and latex.  MEDICATIONS:  Current Outpatient Medications  Medication Sig Dispense Refill   terbinafine (LAMISIL) 250 MG tablet Take 1 tablet (250 mg total) by mouth daily. 30 tablet 2   anastrozole (ARIMIDEX) 1 MG tablet Take 1 tablet (1 mg total) by mouth daily. 90 tablet 3   cholecalciferol (VITAMIN D3) 25 MCG (1000 UNIT) tablet Take 1 tablet (1,000 Units total) by mouth daily. 90 tablet 4   lisinopril-hydrochlorothiazide (PRINZIDE,ZESTORETIC) 10-12.5 MG tablet Take 1 tablet by mouth daily.     rosuvastatin (CRESTOR) 5 MG tablet Take 1 tablet (5 mg total) by mouth daily.     No current facility-administered medications for this visit.    PHYSICAL EXAMINATION: ECOG PERFORMANCE STATUS: 1 - Symptomatic but completely ambulatory  Vitals:   05/30/22 1522  BP: 124/74  Pulse: 83  Resp: 17  Temp: 97.7 F (36.5 C)  SpO2: 99%   Filed Weights   05/30/22 1522  Weight: 199 lb 12.8 oz (90.6 kg)    BREAST: No palpable masses or nodules in either right or left breasts. No palpable axillary supraclavicular or infraclavicular adenopathy no breast tenderness or nipple discharge. (exam performed in the presence of a chaperone)  LABORATORY DATA:  I have reviewed the data as listed    Latest Ref Rng & Units 05/30/2022    3:03 PM 10/12/2021    3:14 PM 10/26/2020   10:12 AM  CMP  Glucose 70 - 99 mg/dL 92  99  90   BUN 6 - 20 mg/dL _0 Creatinine 0.44 - 1.00 mg/dL 1.16  0.95  0.99   Sodium 135 - 145 mmol/L 139  139  141   Potassium 3.5 - 5.1 mmol/L 3.8  3.8  4.3   Chloride 98 - 111 mmol/L 104  107  106   CO2 22 - 32 mmol/L _1 Calcium 8.9 - 10.3 mg/dL 9.3  8.5  8.8   Total Protein 6.5 - 8.1 g/dL 7.0  6.7  6.9   Total Bilirubin 0.3 - 1.2 mg/dL 0.3  0.5  0.6   Alkaline Phos 38 - 126 U/L 52  59  46    AST 15 - 41 U/L 20  49  18   ALT 0 - 44 U/L 23  86  11     Lab Results  Component Value Date   WBC 8.2 05/30/2022   HGB 13.6 05/30/2022   HCT 39.0 05/30/2022   MCV 96.8 05/30/2022   PLT 244 05/30/2022   NEUTROABS 4.5 05/30/2022    ASSESSMENT & PLAN:  Malignant neoplasm of upper-outer quadrant of right breast in female, estrogen receptor positive ([place]) 02/07/2018 for a clinical T1c-T2 N0, stage IA-B invasive ductal carcinoma, grade 2, estrogen receptor and progesterone receptor positive, HER-2 Neg, Ki 67: 1-10% 03/18/2018: Right lumpectomy: 1.5 cm grade 2 IDC, ER 100%, PR: 20%, Her 2 K Neg, Ki 67: 10% Mammaprint: High Risk 06/04/2018- 07/23/2018; AC-T (6 cycles of Taxol) 10/16/2018 - 12/03/2018: adjuvant XRT Genetics: Neg, A variant of uncertain significance (VUS) in the gene MSH3 was  also identified c.1027+4T>C (Intronic  Current Treatment: Tamoxifen started Jan 2020, (Rt LE DVT) switched to Anastrozole 06/10/21  Toxicities: 1.  Hot flashes 2. joint stiffness could be related to arthritis   Breast Cancer Surveillance: 1. Breast exam 05/30/22: Normal 2.Bil Mamm: 11/01/21: Interval inc thickness of Left Axillary LN 3. Left Mamm 02/02/2022: Left Axill LN became normal, no evidence of malignancy left breast3  RTC in 1 year      No orders of the defined types were placed in this encounter.  The patient has a good understanding of the overall plan. she agrees with it. she will call with any problems that may develop before the next visit here. Total time spent: 30 mins including face to face time and time spent for planning, charting and co-ordination of care   Harriette Ohara, MD 05/30/22    I Gardiner Coins am scribing for Dr. Lindi Adie  I have reviewed the above documentation for accuracy and completeness, and I agree with the above.

## 2022-05-25 ENCOUNTER — Other Ambulatory Visit: Payer: Self-pay | Admitting: Family Medicine

## 2022-05-25 DIAGNOSIS — M7989 Other specified soft tissue disorders: Secondary | ICD-10-CM

## 2022-05-29 ENCOUNTER — Other Ambulatory Visit: Payer: Self-pay | Admitting: *Deleted

## 2022-05-29 DIAGNOSIS — Z170 Estrogen receptor positive status [ER+]: Secondary | ICD-10-CM

## 2022-05-30 ENCOUNTER — Other Ambulatory Visit: Payer: Self-pay

## 2022-05-30 ENCOUNTER — Inpatient Hospital Stay: Payer: BC Managed Care – PPO | Attending: Hematology and Oncology | Admitting: Hematology and Oncology

## 2022-05-30 ENCOUNTER — Inpatient Hospital Stay: Payer: BC Managed Care – PPO

## 2022-05-30 DIAGNOSIS — Z79899 Other long term (current) drug therapy: Secondary | ICD-10-CM | POA: Insufficient documentation

## 2022-05-30 DIAGNOSIS — Z170 Estrogen receptor positive status [ER+]: Secondary | ICD-10-CM | POA: Diagnosis not present

## 2022-05-30 DIAGNOSIS — C50411 Malignant neoplasm of upper-outer quadrant of right female breast: Secondary | ICD-10-CM | POA: Diagnosis present

## 2022-05-30 DIAGNOSIS — Z79811 Long term (current) use of aromatase inhibitors: Secondary | ICD-10-CM | POA: Diagnosis not present

## 2022-05-30 LAB — CBC WITH DIFFERENTIAL (CANCER CENTER ONLY)
Abs Immature Granulocytes: 0.02 10*3/uL (ref 0.00–0.07)
Basophils Absolute: 0.1 10*3/uL (ref 0.0–0.1)
Basophils Relative: 1 %
Eosinophils Absolute: 0.2 10*3/uL (ref 0.0–0.5)
Eosinophils Relative: 3 %
HCT: 39 % (ref 36.0–46.0)
Hemoglobin: 13.6 g/dL (ref 12.0–15.0)
Immature Granulocytes: 0 %
Lymphocytes Relative: 35 %
Lymphs Abs: 2.9 10*3/uL (ref 0.7–4.0)
MCH: 33.7 pg (ref 26.0–34.0)
MCHC: 34.9 g/dL (ref 30.0–36.0)
MCV: 96.8 fL (ref 80.0–100.0)
Monocytes Absolute: 0.6 10*3/uL (ref 0.1–1.0)
Monocytes Relative: 8 %
Neutro Abs: 4.5 10*3/uL (ref 1.7–7.7)
Neutrophils Relative %: 53 %
Platelet Count: 244 10*3/uL (ref 150–400)
RBC: 4.03 MIL/uL (ref 3.87–5.11)
RDW: 11.9 % (ref 11.5–15.5)
WBC Count: 8.2 10*3/uL (ref 4.0–10.5)
nRBC: 0 % (ref 0.0–0.2)

## 2022-05-30 LAB — CMP (CANCER CENTER ONLY)
ALT: 23 U/L (ref 0–44)
AST: 20 U/L (ref 15–41)
Albumin: 4.3 g/dL (ref 3.5–5.0)
Alkaline Phosphatase: 52 U/L (ref 38–126)
Anion gap: 6 (ref 5–15)
BUN: 21 mg/dL — ABNORMAL HIGH (ref 6–20)
CO2: 29 mmol/L (ref 22–32)
Calcium: 9.3 mg/dL (ref 8.9–10.3)
Chloride: 104 mmol/L (ref 98–111)
Creatinine: 1.16 mg/dL — ABNORMAL HIGH (ref 0.44–1.00)
GFR, Estimated: 56 mL/min — ABNORMAL LOW (ref >60)
Glucose, Bld: 92 mg/dL (ref 70–99)
Potassium: 3.8 mmol/L (ref 3.5–5.1)
Sodium: 139 mmol/L (ref 135–145)
Total Bilirubin: 0.3 mg/dL (ref 0.3–1.2)
Total Protein: 7 g/dL (ref 6.5–8.1)

## 2022-05-30 MED ORDER — TERBINAFINE HCL 250 MG PO TABS: 250.0000 mg | ORAL_TABLET | Freq: Every day | ORAL | 2 refills | Status: AC

## 2022-05-30 MED ORDER — ANASTROZOLE 1 MG PO TABS: 1.0000 mg | ORAL_TABLET | Freq: Every day | ORAL | 3 refills | Status: DC

## 2022-05-30 NOTE — Assessment & Plan Note (Addendum)
02/07/2018 for a clinical T1c-T2 N0, stage IA-B invasive ductal carcinoma, grade 2, estrogen receptor and progesterone receptor positive, HER-2 Neg, Ki 67: 1-10% 03/18/2018: Right lumpectomy: 1.5 cm grade 2 IDC, ER 100%, PR: 20%, Her 2 K Neg, Ki 67: 10% Mammaprint: High Risk 06/04/2018- 07/23/2018; AC-T (6 cycles of Taxol) 10/16/2018 - 12/03/2018: adjuvant XRT Genetics: Neg, A variant of uncertain significance (VUS) in the gene MSH3 was also identified c.1027+4T>C (Intronic  Current Treatment: Tamoxifen started Jan 2020, (Rt LE DVT) switched to Anastrozole 06/10/21  Toxicities: 1.  Hot flashes 2. joint stiffness could be related to arthritis   Breast Cancer Surveillance: 1. Breast exam 05/30/22: Normal 2.Bil Mamm: 11/01/21: Interval inc thickness of Left Axillary LN 3. Left Mamm 02/02/2022: Left Axill LN became normal, no evidence of malignancy left breast3  RTC in 1 year

## 2022-06-01 ENCOUNTER — Other Ambulatory Visit: Payer: BC Managed Care – PPO

## 2022-06-02 ENCOUNTER — Ambulatory Visit
Admission: RE | Admit: 2022-06-02 | Discharge: 2022-06-02 | Disposition: A | Discharge status: Home / Self Care | Payer: BC Managed Care – PPO | Source: Ambulatory Visit | Attending: Family Medicine | Admitting: Family Medicine

## 2022-06-02 DIAGNOSIS — M7989 Other specified soft tissue disorders: Secondary | ICD-10-CM

## 2022-07-15 ENCOUNTER — Other Ambulatory Visit: Payer: Self-pay | Admitting: Adult Health

## 2022-07-15 DIAGNOSIS — Z170 Estrogen receptor positive status [ER+]: Secondary | ICD-10-CM

## 2022-09-04 ENCOUNTER — Encounter: Payer: Self-pay | Admitting: Hematology and Oncology

## 2022-09-04 ENCOUNTER — Telehealth: Payer: Self-pay

## 2022-09-04 NOTE — Telephone Encounter (Signed)
Pt called and states her PCP advised her to stop taking Anastrozole for 2 weeks then call his office as she has had "strange allergic reactions" and several allergy tests with no definitive answer to what she is allergic to. Pt reports rash started in June. Advised pt to call her pharmacy to find out if they switched manufacturers in June as sometimes different fillers from different manufacturers can cause allergic reactions. She states she will call her pharmacy to discuss this and call us back to indicate whether or not she needs an appt with Wilber Bihari, NP.

## 2022-09-13 ENCOUNTER — Telehealth: Payer: Self-pay | Admitting: Hematology and Oncology

## 2022-09-13 NOTE — Telephone Encounter (Signed)
Scheduled appointment per 10/4 staff message. Talked with the patients husband [name] and he confirmed the time and date.

## 2022-09-13 NOTE — Progress Notes (Signed)
Patient Care Team: Nicholas Lose, MD as PCP - General (Hematology and Oncology) Magrinat, Virgie Dad, MD (Inactive) as Consulting Physician (Oncology) Kyung Rudd, MD as Consulting Physician (Radiation Oncology) [name], MD as Consulting Physician (General Surgery) Janyth Pupa, DO as Consulting Physician (Obstetrics and Gynecology) Gregor Hams, MD as Consulting Physician (Family Medicine)  DIAGNOSIS:  Encounter Diagnoses  Name Primary?   Malignant neoplasm of upper-outer quadrant of right breast in female, estrogen receptor positive ([place])    Rash Yes    SUMMARY OF ONCOLOGIC HISTORY: Oncology History  Malignant neoplasm of upper-outer quadrant of right breast in female, estrogen receptor positive ([place])  03/05/2018 Initial Diagnosis   status post right breast upper outer quadrant biopsy 02/07/2018 for a clinical T1c-T2 N0, stage IA-B invasive ductal carcinoma, grade 2, estrogen receptor and progesterone receptor positive, HER-2 not amplified, with an MIB-1 of 10%   03/13/2018 Genetic Testing   The Common Hereditary Cancer Panel offered by [facility] includes sequencing and/or deletion duplication testing of the following 47 genes: APC, ATM, AXIN2, BARD1, BMPR1A, BRCA1, BRCA2, BRIP1, CDH1, CDKN2A (p14ARF), CDKN2A (p16INK4a), CKD4, CHEK2, CTNNA1, DICER1, EPCAM (Deletion/duplication testing only), GREM1 (promoter region deletion/duplication testing only), KIT, MEN1, MLH1, MSH2, MSH3, MSH6, MUTYH, NBN, NF1, NHTL1, PALB2, PDGFRA, PMS2, POLD1, POLE, PTEN, RAD50, RAD51C, RAD51D, SDHB, SDHC, SDHD, SMAD4, SMARCA4. STK11, TP53, TSC1, TSC2, and VHL.  The following genes were evaluated for sequence changes only: SDHA and HOXB13 c.251G>A variant only.  Results: No pathogenic variants identified. A variant of uncertain significance (VUS) in the gene MSH3 was also identified c.1027+4T>C (Intronic).  VUS's should not be used to impact medical management.  The date of this test report is 03/13/2018.     03/18/2018 Surgery   status post right lumpectomy for a pT1c pN0, stage IA invasive ductal carcinoma, grade 2, estrogen and progesterone receptor positive, with negative margins.  A total of 1 lymph node was removed  Mammaprint showed high risk, indicating that with chemotherapy and hormone therapy, the patient would have a nearly 95% chance of having no distant metastases within the next 5 years   06/04/2018 - 09/13/2018 Adjuvant Chemotherapy   chemotherapy consisting of doxorubicin and cyclophosphamide in dose dense fashion x4 started 06/04/2018, completed 07/23/2018 followed by weekly paclitaxel started 08/09/2018, discontinued 09/13/2018             (a) baseline echocardiogram on 05/23/2018 showed an ejection fraction in the 65-75% range             (b) adjuvant paclitaxel discontinued after 6 cycles because of neuropathy   10/16/2018 - 12/03/2018 Radiation Therapy   Site/dose: The patient initially received a dose of 50.4 Gy in 28 fractions to the right breast using whole-breast tangent fields. This was delivered using a 3-D conformal technique. The patient then received a boost to the seroma. This delivered an additional 10 Gy in 5 fractions using 6x photons with a Complex Isodose technique. The total dose was 60.4 Gy.     12/2018 -  Anti-estrogen oral therapy   Tamoxifen daily switched to anastrozole when she developed blood clot July 2022     CHIEF COMPLIANT: Follow up breast cancer on Anastrozole    INTERVAL HISTORY: Anna Conrad is a 56 y.o. with the above mention follow-up on Anastrozole.  Since August she has been having a rash.  Because of the suspicion that it could be a medication rash we stopped anastrozole 2 weeks ago.  She has not noticed any major difference although  there is some skin peeling that she has noticed recently.  She presents to the clinic today for a follow-up to discuss rash. She reports that the rash has started peeling but the swelling has went down. She  denies dry mouth.   ALLERGIES:  is allergic to adhesive [tape] and latex.  MEDICATIONS:  Current Outpatient Medications  Medication Sig Dispense Refill   anastrozole (ARIMIDEX) 1 MG tablet Take 1 tablet (1 mg total) by mouth daily. 90 tablet 3   cholecalciferol (VITAMIN D3) 25 MCG (1000 UNIT) tablet Take 1 tablet (1,000 Units total) by mouth daily. 90 tablet 4   lisinopril-hydrochlorothiazide (PRINZIDE,ZESTORETIC) 10-12.5 MG tablet Take 1 tablet by mouth daily.     rosuvastatin (CRESTOR) 5 MG tablet Take 1 tablet (5 mg total) by mouth daily.     No current facility-administered medications for this visit.    PHYSICAL EXAMINATION: ECOG PERFORMANCE STATUS: 1 - Symptomatic but completely ambulatory  Vitals:   09/18/22 1406  BP: 120/87  Pulse: 95  Resp: 18  Temp: (!) 97.5 F (36.4 C)  SpO2: 98%   Filed Weights   09/18/22 1406  Weight: 196 lb 12.8 oz (89.3 kg)         LABORATORY DATA:  I have reviewed the data as listed    Latest Ref Rng & Units 05/30/2022    3:03 PM 10/12/2021    3:14 PM 10/26/2020   10:12 AM  CMP  Glucose 70 - 99 mg/dL 92  99  90   BUN 6 - 20 mg/dL 21  13  12    Creatinine 0.44 - 1.00 mg/dL 1.16  0.95  0.99   Sodium 135 - 145 mmol/L 139  139  141   Potassium 3.5 - 5.1 mmol/L 3.8  3.8  4.3   Chloride 98 - 111 mmol/L 104  107  106   CO2 22 - 32 mmol/L 29  25  28    Calcium 8.9 - 10.3 mg/dL 9.3  8.5  8.8   Total Protein 6.5 - 8.1 g/dL 7.0  6.7  6.9   Total Bilirubin 0.3 - 1.2 mg/dL 0.3  0.5  0.6   Alkaline Phos 38 - 126 U/L 52  59  46   AST 15 - 41 U/L 20  49  18   ALT 0 - 44 U/L 23  86  11     Lab Results  Component Value Date   WBC 8.2 05/30/2022   HGB 13.6 05/30/2022   HCT 39.0 05/30/2022   MCV 96.8 05/30/2022   PLT 244 05/30/2022   NEUTROABS 4.5 05/30/2022    ASSESSMENT & PLAN:  Malignant neoplasm of upper-outer quadrant of right breast in female, estrogen receptor positive ([place]) 02/07/2018 for a clinical T1c-T2 N0, stage IA-B  invasive ductal carcinoma, grade 2, estrogen receptor and progesterone receptor positive, HER-2 Neg, Ki 67: 1-10% 03/18/2018: Right lumpectomy: 1.5 cm grade 2 IDC, ER 100%, PR: 20%, Her 2 K Neg, Ki 67: 10% Mammaprint: High Risk 06/04/2018- 07/23/2018; AC-T (6 cycles of Taxol) 10/16/2018 - 12/03/2018: adjuvant XRT Genetics: Neg, A variant of uncertain significance (VUS) in the gene MSH3 was also identified c.1027+4T>C (Intronic   Current Treatment: Tamoxifen started Jan 2020, (Rt LE DVT) switched to Anastrozole 06/10/21 on hold since September 2023 (because of facial rash)   Rash on the face: Butterfly type distribution.  I suspect lupus.  We will send for ANA. I will call her with the result of this test to discuss if rheumatology consultation  is needed. She was apparently referred to an allergy specialist.  But she has not seen them yet.   Breast Cancer Surveillance: 1. Breast exam 05/30/22: Normal 2.Bil Mamm: 11/01/21: Interval inc thickness of Left Axillary LN 3. Left Mamm 02/02/2022: Left Axill LN became normal, no evidence of malignancy left breast3   Telephone visit to discuss results of the blood work.   Orders Placed This Encounter  Procedures   ANA, IFA (with reflex)    Standing Status:   Future    Number of Occurrences:   1    Standing Expiration Date:   09/18/2023   CBC with Differential ([facility] Only)    Standing Status:   Future    Number of Occurrences:   1    Standing Expiration Date:   09/19/2023   C-reactive protein    Standing Status:   Future    Number of Occurrences:   1    Standing Expiration Date:   09/18/2023   The patient has a good understanding of the overall plan. she agrees with it. she will call with any problems that may develop before the next visit here. Total time spent: 30 mins including face to face time and time spent for planning, charting and co-ordination of care   Harriette Ohara, MD 09/18/22    I Gardiner Coins am scribing for Dr.  Lindi Adie  I have reviewed the above documentation for accuracy and completeness, and I agree with the above.

## 2022-09-18 ENCOUNTER — Inpatient Hospital Stay: Payer: BC Managed Care – PPO | Attending: Hematology and Oncology | Admitting: Hematology and Oncology

## 2022-09-18 ENCOUNTER — Inpatient Hospital Stay: Payer: BC Managed Care – PPO

## 2022-09-18 ENCOUNTER — Other Ambulatory Visit: Payer: Self-pay

## 2022-09-18 VITALS — BP 120/87 | HR 95 | Temp 97.5°F | Resp 18 | Ht 62.0 in | Wt 196.8 lb

## 2022-09-18 DIAGNOSIS — Z170 Estrogen receptor positive status [ER+]: Secondary | ICD-10-CM | POA: Insufficient documentation

## 2022-09-18 DIAGNOSIS — C50411 Malignant neoplasm of upper-outer quadrant of right female breast: Secondary | ICD-10-CM | POA: Diagnosis present

## 2022-09-18 DIAGNOSIS — Z79811 Long term (current) use of aromatase inhibitors: Secondary | ICD-10-CM | POA: Insufficient documentation

## 2022-09-18 DIAGNOSIS — R21 Rash and other nonspecific skin eruption: Secondary | ICD-10-CM

## 2022-09-18 LAB — CBC WITH DIFFERENTIAL (CANCER CENTER ONLY)
Abs Immature Granulocytes: 0.01 10*3/uL (ref 0.00–0.07)
Basophils Absolute: 0 10*3/uL (ref 0.0–0.1)
Basophils Relative: 1 %
Eosinophils Absolute: 0.2 10*3/uL (ref 0.0–0.5)
Eosinophils Relative: 3 %
HCT: 37.9 % (ref 36.0–46.0)
Hemoglobin: 13.3 g/dL (ref 12.0–15.0)
Immature Granulocytes: 0 %
Lymphocytes Relative: 32 %
Lymphs Abs: 2.5 10*3/uL (ref 0.7–4.0)
MCH: 33.5 pg (ref 26.0–34.0)
MCHC: 35.1 g/dL (ref 30.0–36.0)
MCV: 95.5 fL (ref 80.0–100.0)
Monocytes Absolute: 0.5 10*3/uL (ref 0.1–1.0)
Monocytes Relative: 7 %
Neutro Abs: 4.5 10*3/uL (ref 1.7–7.7)
Neutrophils Relative %: 57 %
Platelet Count: 281 10*3/uL (ref 150–400)
RBC: 3.97 MIL/uL (ref 3.87–5.11)
RDW: 11.7 % (ref 11.5–15.5)
WBC Count: 7.7 10*3/uL (ref 4.0–10.5)
nRBC: 0 % (ref 0.0–0.2)

## 2022-09-18 LAB — C-REACTIVE PROTEIN: CRP: 1.3 mg/dL — ABNORMAL HIGH (ref <1.0)

## 2022-09-18 NOTE — Assessment & Plan Note (Signed)
02/07/2018 for a clinical T1c-T2 N0, stage IA-B invasive ductal carcinoma, grade 2, estrogen receptor and progesterone receptor positive, HER-2 Neg, Ki 67: 1-10% 03/18/2018: Right lumpectomy: 1.5 cm grade 2 IDC, ER 100%, PR: 20%, Her 2 K Neg, Ki 67: 10% Mammaprint: High Risk 06/04/2018- 07/23/2018; AC-T (6 cycles of Taxol) 10/16/2018 - 12/03/2018: adjuvant XRT Genetics: Neg, A variant of uncertain significance (VUS) in the gene MSH3 was also identified c.1027+4T>C (Intronic  Current Treatment: Tamoxifen started Jan 2020, (Rt LE DVT) switched to Anastrozole 06/10/21  Toxicities: 1.  Hot flashes 2. joint stiffness could be related to arthritis 3.  Rash: Suspicious that it may be related to anastrozole. I recommended discontinuation of anastrozole therapy and to see if the rash gets better.   Breast Cancer Surveillance: 1. Breast exam 05/30/22: Normal 2.Bil Mamm: 11/01/21: Interval inc thickness of Left Axillary LN 3. Left Mamm 02/02/2022: Left Axill LN became normal, no evidence of malignancy left breast3

## 2022-09-19 ENCOUNTER — Ambulatory Visit: Payer: BC Managed Care – PPO | Admitting: Hematology and Oncology

## 2022-09-19 ENCOUNTER — Encounter: Payer: Self-pay | Admitting: Hematology and Oncology

## 2022-09-19 LAB — ANTINUCLEAR ANTIBODIES, IFA: ANA Ab, IFA: NEGATIVE

## 2022-09-20 ENCOUNTER — Inpatient Hospital Stay (HOSPITAL_BASED_OUTPATIENT_CLINIC_OR_DEPARTMENT_OTHER): Payer: BC Managed Care – PPO | Admitting: Hematology and Oncology

## 2022-09-20 DIAGNOSIS — Z170 Estrogen receptor positive status [ER+]: Secondary | ICD-10-CM

## 2022-09-20 DIAGNOSIS — C50411 Malignant neoplasm of upper-outer quadrant of right female breast: Secondary | ICD-10-CM

## 2022-09-20 NOTE — Assessment & Plan Note (Addendum)
Telephone visit: Patient did not answer her phone.  I left a voicemail

## 2022-09-20 NOTE — Progress Notes (Signed)
Telephone visit: Patient did not answer her phone. I will to voicemail with instructions that the lupus test came back negative and therefore we still do not know the cause of her rash.

## 2022-10-23 ENCOUNTER — Encounter: Payer: Self-pay | Admitting: Hematology and Oncology

## 2022-10-31 ENCOUNTER — Telehealth: Payer: Self-pay | Admitting: *Deleted

## 2022-10-31 NOTE — Telephone Encounter (Signed)
Received call from pt stating she restarted Anastrozole 10/23/22 and states facial rash has returned.  Per MD pt needing to stop Anastrozole x2 weeks and f/u in office for further evaluation and tx.  Appt scheduled, pt educated and verbalized understanding.

## 2022-11-22 NOTE — Progress Notes (Signed)
Patient Care Team: Nicholas Lose, MD as PCP - General (Hematology and Oncology) Magrinat, Virgie Dad, MD (Inactive) as Consulting Physician (Oncology) Kyung Rudd, MD as Consulting Physician (Radiation Oncology) [name], MD as Consulting Physician (General Surgery) Janyth Pupa, DO as Consulting Physician (Obstetrics and Gynecology) Gregor Hams, MD as Consulting Physician (Family Medicine)  DIAGNOSIS: No diagnosis found.  SUMMARY OF ONCOLOGIC HISTORY: Oncology History  Malignant neoplasm of upper-outer quadrant of right breast in female, estrogen receptor positive ([place])  03/05/2018 Initial Diagnosis   status post right breast upper outer quadrant biopsy 02/07/2018 for a clinical T1c-T2 N0, stage IA-B invasive ductal carcinoma, grade 2, estrogen receptor and progesterone receptor positive, HER-2 not amplified, with an MIB-1 of 10%   03/13/2018 Genetic Testing   The Common Hereditary Cancer Panel offered by [facility] includes sequencing and/or deletion duplication testing of the following 47 genes: APC, ATM, AXIN2, BARD1, BMPR1A, BRCA1, BRCA2, BRIP1, CDH1, CDKN2A (p14ARF), CDKN2A (p16INK4a), CKD4, CHEK2, CTNNA1, DICER1, EPCAM (Deletion/duplication testing only), GREM1 (promoter region deletion/duplication testing only), KIT, MEN1, MLH1, MSH2, MSH3, MSH6, MUTYH, NBN, NF1, NHTL1, PALB2, PDGFRA, PMS2, POLD1, POLE, PTEN, RAD50, RAD51C, RAD51D, SDHB, SDHC, SDHD, SMAD4, SMARCA4. STK11, TP53, TSC1, TSC2, and VHL.  The following genes were evaluated for sequence changes only: SDHA and HOXB13 c.251G>A variant only.  Results: No pathogenic variants identified. A variant of uncertain significance (VUS) in the gene MSH3 was also identified c.1027+4T>C (Intronic).  VUS's should not be used to impact medical management.  The date of this test report is 03/13/2018.    03/18/2018 Surgery   status post right lumpectomy for a pT1c pN0, stage IA invasive ductal carcinoma, grade 2, estrogen and progesterone  receptor positive, with negative margins.  A total of 1 lymph node was removed  Mammaprint showed high risk, indicating that with chemotherapy and hormone therapy, the patient would have a nearly 95% chance of having no distant metastases within the next 5 years   06/04/2018 - 09/13/2018 Adjuvant Chemotherapy   chemotherapy consisting of doxorubicin and cyclophosphamide in dose dense fashion x4 started 06/04/2018, completed 07/23/2018 followed by weekly paclitaxel started 08/09/2018, discontinued 09/13/2018             (a) baseline echocardiogram on 05/23/2018 showed an ejection fraction in the 65-75% range             (b) adjuvant paclitaxel discontinued after 6 cycles because of neuropathy   10/16/2018 - 12/03/2018 Radiation Therapy   Site/dose: The patient initially received a dose of 50.4 Gy in 28 fractions to the right breast using whole-breast tangent fields. This was delivered using a 3-D conformal technique. The patient then received a boost to the seroma. This delivered an additional 10 Gy in 5 fractions using 6x photons with a Complex Isodose technique. The total dose was 60.4 Gy.     12/2018 -  Anti-estrogen oral therapy   Tamoxifen daily switched to anastrozole when she developed blood clot July 2022     CHIEF COMPLIANT: Follow up breast cancer on Anastrozole      INTERVAL HISTORY: Anna Conrad is a 56 y.o. with the above mention follow-up on Anastrozole. She presents to the clinic for a follow-up.    ALLERGIES:  is allergic to adhesive [tape] and latex.  MEDICATIONS:  Current Outpatient Medications  Medication Sig Dispense Refill   anastrozole (ARIMIDEX) 1 MG tablet Take 1 tablet (1 mg total) by mouth daily. 90 tablet 3   cholecalciferol (VITAMIN D3) 25 MCG (1000 UNIT) tablet  Take 1 tablet (1,000 Units total) by mouth daily. 90 tablet 4   lisinopril-hydrochlorothiazide (PRINZIDE,ZESTORETIC) 10-12.5 MG tablet Take 1 tablet by mouth daily.     rosuvastatin (CRESTOR) 5 MG  tablet Take 1 tablet (5 mg total) by mouth daily.     No current facility-administered medications for this visit.    PHYSICAL EXAMINATION: ECOG PERFORMANCE STATUS: {CHL ONC ECOG PS:[phone]}  There were no vitals filed for this visit. There were no vitals filed for this visit.  BREAST:*** No palpable masses or nodules in either right or left breasts. No palpable axillary supraclavicular or infraclavicular adenopathy no breast tenderness or nipple discharge. (exam performed in the presence of a chaperone)  LABORATORY DATA:  I have reviewed the data as listed    Latest Ref Rng & Units 05/30/2022    3:03 PM 10/12/2021    3:14 PM 10/26/2020   10:12 AM  CMP  Glucose 70 - 99 mg/dL 92  99  90   BUN 6 - 20 mg/dL _0 Creatinine 0.44 - 1.00 mg/dL 1.16  0.95  0.99   Sodium 135 - 145 mmol/L 139  139  141   Potassium 3.5 - 5.1 mmol/L 3.8  3.8  4.3   Chloride 98 - 111 mmol/L 104  107  106   CO2 22 - 32 mmol/L _1 Calcium 8.9 - 10.3 mg/dL 9.3  8.5  8.8   Total Protein 6.5 - 8.1 g/dL 7.0  6.7  6.9   Total Bilirubin 0.3 - 1.2 mg/dL 0.3  0.5  0.6   Alkaline Phos 38 - 126 U/L 52  59  46   AST 15 - 41 U/L 20  49  18   ALT 0 - 44 U/L 23  86  11     Lab Results  Component Value Date   WBC 7.7 09/18/2022   HGB 13.3 09/18/2022   HCT 37.9 09/18/2022   MCV 95.5 09/18/2022   PLT 281 09/18/2022   NEUTROABS 4.5 09/18/2022    ASSESSMENT & PLAN:  No problem-specific Assessment & Plan notes found for this encounter.    No orders of the defined types were placed in this encounter.  The patient has a good understanding of the overall plan. she agrees with it. she will call with any problems that may develop before the next visit here. Total time spent: 30 mins including face to face time and time spent for planning, charting and co-ordination of care   Suzzette Righter, [place] 11/22/22  I Gardiner Coins am acting as a Education administrator for [employer]  ***

## 2022-11-23 ENCOUNTER — Inpatient Hospital Stay: Payer: BC Managed Care – PPO | Attending: Hematology and Oncology | Admitting: Hematology and Oncology

## 2022-11-23 ENCOUNTER — Other Ambulatory Visit: Payer: Self-pay

## 2022-11-23 VITALS — BP 109/75 | HR 80 | Temp 97.3°F | Resp 18 | Ht 62.0 in | Wt 196.8 lb

## 2022-11-23 DIAGNOSIS — C50411 Malignant neoplasm of upper-outer quadrant of right female breast: Secondary | ICD-10-CM | POA: Diagnosis present

## 2022-11-23 DIAGNOSIS — Z79811 Long term (current) use of aromatase inhibitors: Secondary | ICD-10-CM | POA: Diagnosis not present

## 2022-11-23 DIAGNOSIS — R21 Rash and other nonspecific skin eruption: Secondary | ICD-10-CM | POA: Insufficient documentation

## 2022-11-23 DIAGNOSIS — Z170 Estrogen receptor positive status [ER+]: Secondary | ICD-10-CM

## 2022-11-23 NOTE — Assessment & Plan Note (Signed)
02/07/2018 for a clinical T1c-T2 N0, stage IA-B invasive ductal carcinoma, grade 2, estrogen receptor and progesterone receptor positive, HER-2 Neg, Ki 67: 1-10% 03/18/2018: Right lumpectomy: 1.5 cm grade 2 IDC, ER 100%, PR: 20%, Her 2 K Neg, Ki 67: 10% Mammaprint: High Risk 06/04/2018- 07/23/2018; AC-T (6 cycles of Taxol) 10/16/2018 - 12/03/2018: adjuvant XRT Genetics: Neg, A variant of uncertain significance (VUS) in the gene MSH3 was also identified c.1027+4T>C (Intronic   Current Treatment: Tamoxifen started Jan 2020, (Rt LE DVT) switched to Anastrozole 06/10/21 on hold since September 2023 (because of facial rash)   Rash on the face: Butterfly type distribution.  I suspect lupus.  We will send for ANA. I will call her with the result of this test to discuss if rheumatology consultation is needed. She was apparently referred to an allergy specialist.  But she has not seen them yet.   Breast Cancer Surveillance: 1. Breast exam 11/23/2022: Normal 2.Bil Mamm: 11/01/21: Interval inc thickness of Left Axillary LN 3. Left Mamm 02/02/2022: Left Axill LN became normal, no evidence of malignancy left breast   Return to clinic in 1 year for follow

## 2022-12-01 ENCOUNTER — Other Ambulatory Visit: Payer: Self-pay | Admitting: Hematology and Oncology

## 2022-12-01 DIAGNOSIS — Z170 Estrogen receptor positive status [ER+]: Secondary | ICD-10-CM

## 2022-12-12 ENCOUNTER — Ambulatory Visit
Admission: RE | Admit: 2022-12-12 | Discharge: 2022-12-12 | Disposition: A | Discharge status: Home / Self Care | Payer: BC Managed Care – PPO | Source: Ambulatory Visit | Attending: Hematology and Oncology | Admitting: Hematology and Oncology

## 2022-12-12 DIAGNOSIS — Z170 Estrogen receptor positive status [ER+]: Secondary | ICD-10-CM

## 2022-12-12 HISTORY — DX: Personal history of antineoplastic chemotherapy: Z92.21

## 2023-01-24 ENCOUNTER — Ambulatory Visit: Payer: BC Managed Care – PPO

## 2023-03-07 NOTE — Progress Notes (Unsigned)
    [name] D.[facility] [place] Phone: [phone]   Assessment and Plan:     There are no diagnoses linked to this encounter.  ***   Pertinent previous records reviewed include ***   Follow Up: ***     Subjective:   I, Samule Life, am serving as a Education administrator for Doctor Glennon Mac  Chief Complaint: left knee pain   HPI:   08/31/2021 Charl Emlynn Driskill is a 57 y.o. female who presents to [place] at [facility] today for f/u of L knee pain.  She was last seen by Dr. Georgina Snell on 07/20/21 and had a L knee injection.  She was also referred to PT of which she completed one session and was d/c.  Today, pt reports L knee started hurting again yesterday, but overall it's been feeling a lot better. No swelling noted.    Diagnostic testing: L knee XR- 07/20/21   Pertinent review of systems: No fevers or chills   Relevant historical information: Breast cancer history  03/08/2023 Patient states  Relevant Historical Information: ***  Additional pertinent review of systems negative.   Current Outpatient Medications:    lisinopril-hydrochlorothiazide (PRINZIDE,ZESTORETIC) 10-12.5 MG tablet, Take 1 tablet by mouth daily., Disp: , Rfl:    Objective:     There were no vitals filed for this visit.    There is no height or weight on file to calculate BMI.    Physical Exam:    ***   Electronically signed by:  [name] D.[facility] 7:36 AM 03/07/23

## 2023-03-08 ENCOUNTER — Ambulatory Visit: Payer: BC Managed Care – PPO | Admitting: Sports Medicine

## 2023-03-08 VITALS — BP 122/80 | HR 82 | Ht 62.0 in | Wt 196.0 lb

## 2023-03-08 DIAGNOSIS — M25562 Pain in left knee: Secondary | ICD-10-CM | POA: Diagnosis not present

## 2023-03-08 DIAGNOSIS — M1712 Unilateral primary osteoarthritis, left knee: Secondary | ICD-10-CM

## 2023-03-08 DIAGNOSIS — G8929 Other chronic pain: Secondary | ICD-10-CM | POA: Diagnosis not present

## 2023-03-08 NOTE — Patient Instructions (Addendum)
Good to see you  F/u in 1 week for Zilretta injection Do prescribed exercises at least 3x a week Ibuprofen as needed for knee pain

## 2023-03-14 NOTE — Progress Notes (Unsigned)
    [name] D.[facility] [place] Phone: [phone]   Assessment and Plan:     There are no diagnoses linked to this encounter.  ***   Pertinent previous records reviewed include ***   Follow Up: ***     Subjective:   I, Anna Conrad, am serving as a Education administrator for Doctor Glennon Mac   Chief Complaint: left knee pain    HPI:    08/31/2021 Anna Conrad is a 57 y.o. female who presents to [place] at [facility] today for f/u of L knee pain.  She was last seen by Dr. Georgina Snell on 07/20/21 and had a L knee injection.  She was also referred to PT of which she completed one session and was d/c.  Today, pt reports L knee started hurting again yesterday, but overall it's been feeling a lot better. No swelling noted.    Diagnostic testing: L knee XR- 07/20/21   Pertinent review of systems: No fevers or chills   Relevant historical information: Breast cancer history   03/08/2023 Patient states she was given CSI and 3 gel shots in August ,that worked fine until dec when the pain returned she got another CSI in January , had a 3 series of gel shots in the beginning of march , the pain has returned the shots did not help , her knee feel like it has fluid on it  03/15/2023 Patient states   Relevant Historical Information: History of breast cancer  Additional pertinent review of systems negative.   Current Outpatient Medications:    lisinopril-hydrochlorothiazide (PRINZIDE,ZESTORETIC) 10-12.5 MG tablet, Take 1 tablet by mouth daily. (Patient not taking: Reported on 03/08/2023), Disp: , Rfl:    valsartan (DIOVAN) 40 MG tablet, Take 40 mg by mouth daily., Disp: , Rfl:    Objective:     There were no vitals filed for this visit.    There is no height or weight on file to calculate BMI.    Physical Exam:    ***   Electronically signed by:  [name] D.[facility] 7:27 AM 03/14/23

## 2023-03-15 ENCOUNTER — Ambulatory Visit (INDEPENDENT_AMBULATORY_CARE_PROVIDER_SITE_OTHER): Payer: BC Managed Care – PPO | Admitting: Sports Medicine

## 2023-03-15 DIAGNOSIS — M1712 Unilateral primary osteoarthritis, left knee: Secondary | ICD-10-CM

## 2023-03-15 DIAGNOSIS — G8929 Other chronic pain: Secondary | ICD-10-CM | POA: Diagnosis not present

## 2023-03-15 DIAGNOSIS — M25562 Pain in left knee: Secondary | ICD-10-CM | POA: Diagnosis not present

## 2023-03-15 MED ORDER — TRIAMCINOLONE ACETONIDE 32 MG IX SRER
32.0000 mg | Freq: Once | INTRA_ARTICULAR | Status: AC
  Administered 2023-03-15: 32 mg via INTRA_ARTICULAR

## 2023-04-11 NOTE — Progress Notes (Unsigned)
    [name] D.[facility] [address] [place] [address] Phone: [phone]   Assessment and Plan:     There are no diagnoses linked to this encounter.  ***   Pertinent previous records reviewed include ***   Follow Up: ***     Subjective:   I, Anna Conrad, am serving as a Neurosurgeon for Doctor Richardean Sale   Chief Complaint: left knee pain    HPI:    08/31/2021 [name] is a 57 y.o. female who presents to [facility] today for f/u of L knee pain.  She was last seen by Dr. Denyse Amass on 07/20/21 and had a L knee injection.  She was also referred to PT of which she completed one session and was d/c.  Today, pt reports L knee started hurting again yesterday, but overall it's been feeling a lot better. No swelling noted.    Diagnostic testing: L knee XR- 07/20/21   Pertinent review of systems: No fevers or chills   Relevant historical information: Breast cancer history   03/08/2023 Patient states she was given CSI and 3 gel shots in August ,that worked fine until dec when the pain returned she got another CSI in January , had a 3 series of gel shots in the beginning of march , the pain has returned the shots did not help , her knee feel like it has fluid on it   03/15/2023 Patient presents for Zilretta injection.  04/12/2023 Patient states     Relevant Historical Information: History of breast cancer  Additional pertinent review of systems negative.   Current Outpatient Medications:    lisinopril-hydrochlorothiazide (PRINZIDE,ZESTORETIC) 10-12.5 MG tablet, Take 1 tablet by mouth daily. (Patient not taking: Reported on 03/08/2023), Disp: , Rfl:    valsartan (DIOVAN) 40 MG tablet, Take 40 mg by mouth daily., Disp: , Rfl:    Objective:     There were no vitals filed for this visit.    There is no height or weight on file to calculate BMI.    Physical Exam:    ***   Electronically signed  by:  [name] D.[facility] 7:40 AM 04/11/23

## 2023-04-12 ENCOUNTER — Ambulatory Visit: Payer: BC Managed Care – PPO | Admitting: Sports Medicine

## 2023-04-12 VITALS — BP 130/82 | HR 86 | Ht 62.0 in | Wt 191.0 lb

## 2023-04-12 DIAGNOSIS — G8929 Other chronic pain: Secondary | ICD-10-CM | POA: Diagnosis not present

## 2023-04-12 DIAGNOSIS — M1712 Unilateral primary osteoarthritis, left knee: Secondary | ICD-10-CM

## 2023-04-12 DIAGNOSIS — M25562 Pain in left knee: Secondary | ICD-10-CM

## 2023-04-12 NOTE — Patient Instructions (Signed)
Good to see you   

## 2023-05-23 NOTE — Progress Notes (Signed)
Patient Care Team: Serena Croissant, MD as PCP - General (Hematology and Oncology) Magrinat, Valentino Hue, MD (Inactive) as Consulting Physician (Oncology) Dorothy Puffer, MD as Consulting Physician (Radiation Oncology) Abigail Miyamoto, MD as Consulting Physician (General Surgery) Myna Hidalgo, DO as Consulting Physician (Obstetrics and Gynecology) Rodolph Bong, MD as Consulting Physician (Family Medicine)  DIAGNOSIS:  Encounter Diagnoses  Name Primary?   Malignant neoplasm of upper-outer quadrant of right breast in female, estrogen receptor positive ([facility]) Yes   Postmenopausal     SUMMARY OF ONCOLOGIC HISTORY: Oncology History  Malignant neoplasm of upper-outer quadrant of right breast in female, estrogen receptor positive ([facility])  03/05/2018 Initial Diagnosis   status post right breast upper outer quadrant biopsy 02/07/2018 for a clinical T1c-T2 N0, stage IA-B invasive ductal carcinoma, grade 2, estrogen receptor and progesterone receptor positive, HER-2 not amplified, with an MIB-1 of 10%   03/13/2018 Genetic Testing   The Common Hereditary Cancer Panel offered by [facility] includes sequencing and/or deletion duplication testing of the following 47 genes: APC, ATM, AXIN2, BARD1, BMPR1A, BRCA1, BRCA2, BRIP1, CDH1, CDKN2A (p14ARF), CDKN2A (p16INK4a), CKD4, CHEK2, CTNNA1, DICER1, EPCAM (Deletion/duplication testing only), GREM1 (promoter region deletion/duplication testing only), KIT, MEN1, MLH1, MSH2, MSH3, MSH6, MUTYH, NBN, NF1, NHTL1, PALB2, PDGFRA, PMS2, POLD1, POLE, PTEN, RAD50, RAD51C, RAD51D, SDHB, SDHC, SDHD, SMAD4, SMARCA4. STK11, TP53, TSC1, TSC2, and VHL.  The following genes were evaluated for sequence changes only: SDHA and HOXB13 c.251G>A variant only.  Results: No pathogenic variants identified. A variant of uncertain significance (VUS) in the gene MSH3 was also identified c.1027+4T>C (Intronic).  VUS's should not be used to impact medical management.  The date of this test report is  03/13/2018.    03/18/2018 Surgery   status post right lumpectomy for a pT1c pN0, stage IA invasive ductal carcinoma, grade 2, estrogen and progesterone receptor positive, with negative margins.  A total of 1 lymph node was removed  Mammaprint showed high risk, indicating that with chemotherapy and hormone therapy, the patient would have a nearly 95% chance of having no distant metastases within the next 5 years   06/04/2018 - 09/13/2018 Adjuvant Chemotherapy   chemotherapy consisting of doxorubicin and cyclophosphamide in dose dense fashion x4 started 06/04/2018, completed 07/23/2018 followed by weekly paclitaxel started 08/09/2018, discontinued 09/13/2018             (a) baseline echocardiogram on 05/23/2018 showed an ejection fraction in the 65-75% range             (b) adjuvant paclitaxel discontinued after 6 cycles because of neuropathy   10/16/2018 - 12/03/2018 Radiation Therapy   Site/dose: The patient initially received a dose of 50.4 Gy in 28 fractions to the right breast using whole-breast tangent fields. This was delivered using a 3-D conformal technique. The patient then received a boost to the seroma. This delivered an additional 10 Gy in 5 fractions using 6x photons with a Complex Isodose technique. The total dose was 60.4 Gy.     12/2018 -  Anti-estrogen oral therapy   Tamoxifen daily switched to anastrozole when she developed blood clot July 2022     CHIEF COMPLIANT:  Follow up breast cancer    INTERVAL HISTORY: Anna Conrad is a 57 y.o. with the above mention follow-up.  She presents to the clinic for a follow-up. She reports that she went to the dermatologist and they stared her on a cream that she is taking twice a day for her rash. She does complain about right  knee pain.   ALLERGIES:  is allergic to adhesive [tape], anastrozole, and latex.  MEDICATIONS:  Current Outpatient Medications  Medication Sig Dispense Refill   letrozole (FEMARA) 2.5 MG tablet Take 1 tablet  (2.5 mg total) by mouth daily. 90 tablet 3   lisinopril-hydrochlorothiazide (PRINZIDE,ZESTORETIC) 10-12.5 MG tablet Take 1 tablet by mouth daily.     valsartan (DIOVAN) 40 MG tablet Take 40 mg by mouth daily.     No current facility-administered medications for this visit.    PHYSICAL EXAMINATION: ECOG PERFORMANCE STATUS: 1 - Symptomatic but completely ambulatory  Vitals:   05/31/23 0936  BP: (!) 140/92  Pulse: 73  Resp: 18  Temp: 97.7 F (36.5 C)  SpO2: 97%   Filed Weights   05/31/23 0936  Weight: 197 lb 6.4 oz (89.5 kg)      LABORATORY DATA:  I have reviewed the data as listed    Latest Ref Rng & Units 05/30/2022    3:03 PM 10/12/2021    3:14 PM 10/26/2020   10:12 AM  CMP  Glucose 70 - 99 mg/dL 92  99  90   BUN 6 - 20 mg/dL 21  13  12    Creatinine 0.44 - 1.00 mg/dL 8.65  7.84  6.96   Sodium 135 - 145 mmol/L 139  139  141   Potassium 3.5 - 5.1 mmol/L 3.8  3.8  4.3   Chloride 98 - 111 mmol/L 104  107  106   CO2 22 - 32 mmol/L 29  25  28    Calcium 8.9 - 10.3 mg/dL 9.3  8.5  8.8   Total Protein 6.5 - 8.1 g/dL 7.0  6.7  6.9   Total Bilirubin 0.3 - 1.2 mg/dL 0.3  0.5  0.6   Alkaline Phos 38 - 126 U/L 52  59  46   AST 15 - 41 U/L 20  49  18   ALT 0 - 44 U/L 23  86  11     Lab Results  Component Value Date   WBC 7.7 09/18/2022   HGB 13.3 09/18/2022   HCT 37.9 09/18/2022   MCV 95.5 09/18/2022   PLT 281 09/18/2022   NEUTROABS 4.5 09/18/2022    ASSESSMENT & PLAN:  Malignant neoplasm of upper-outer quadrant of right breast in female, estrogen receptor positive ([facility]) 02/07/2018 for a clinical T1c-T2 N0, stage IA-B invasive ductal carcinoma, grade 2, estrogen receptor and progesterone receptor positive, HER-2 Neg, Ki 67: 1-10% 03/18/2018: Right lumpectomy: 1.5 cm grade 2 IDC, ER 100%, PR: 20%, Her 2 K Neg, Ki 67: 10% Mammaprint: High Risk 06/04/2018- 07/23/2018; AC-T (6 cycles of Taxol) 10/16/2018 - 12/03/2018: adjuvant XRT Genetics: Neg, A variant of uncertain  significance (VUS) in the gene MSH3 was also identified c.1027+4T>C (Intronic   Current Treatment: Tamoxifen started Jan 2020, (Rt LE DVT) switched to Anastrozole 06/10/21 on hold since September 2023 (because of facial rash)   Rash on the face: ANA negative.  She went to see rheumatology who performed extensive workup and did not find any cause.   Recommendation: Discussed about starting on antiestrogen therapy back again with letrozole.  She is agreeable and I sent a new prescription today.   Breast Cancer Surveillance: Bil Mamm: 12/12/2022: Benign breast density category C  We are also ordering a bone density test. Telephone visit in 3 months to discuss tolerance to letrozole therapy.    Orders Placed This Encounter  Procedures   DG Bone Density    Standing Status:  Future    Standing Expiration Date:   05/30/2024    Order Specific Question:   Reason for Exam (SYMPTOM  OR DIAGNOSIS REQUIRED)    Answer:   postmenopausal    Order Specific Question:   Is patient pregnant?    Answer:   No    Order Specific Question:   Preferred imaging location?    Answer:   [facility]   The patient has a good understanding of the overall plan. she agrees with it. she will call with any problems that may develop before the next visit here. Total time spent: 30 mins including face to face time and time spent for planning, charting and co-ordination of care   Tamsen Meek, MD 05/31/23    I Janan Ridge am acting as a Neurosurgeon for [employer]  I have reviewed the above documentation for accuracy and completeness, and I agree with the above.

## 2023-05-29 ENCOUNTER — Ambulatory Visit: Payer: BC Managed Care – PPO | Admitting: Sports Medicine

## 2023-05-29 VITALS — HR 73 | Ht 62.0 in | Wt 197.0 lb

## 2023-05-29 DIAGNOSIS — M1712 Unilateral primary osteoarthritis, left knee: Secondary | ICD-10-CM | POA: Diagnosis not present

## 2023-05-29 DIAGNOSIS — M25562 Pain in left knee: Secondary | ICD-10-CM

## 2023-05-29 DIAGNOSIS — G8929 Other chronic pain: Secondary | ICD-10-CM

## 2023-05-29 NOTE — Progress Notes (Signed)
[facility] [address] [place] [address] Phone: [phone]   Assessment and Plan:     1. Chronic pain of left knee 2. Primary osteoarthritis of left knee  -Chronic with exacerbation, subsequent visit - Patient has received significant relief for 2 and half months after Zilretta injection on 03/15/23, however she is having return of symptoms gradually over the past week.  Encouraging the patient has had relief from Zilretta injections as previously she was not receiving relief with HA injections and minimal relief with CSI - Discussed that Zilretta injections can only be repeated once every 3 months, so we will plan for follow-up visit after 06/14/2023 for repeat Zilretta injection - Discussed alternative therapies such as physical therapy, PRP, advanced imaging.  Patient declines these at this time, but would consider at future visit - May continue use Tylenol/naproxen as needed for day-to-day pain relief  Pertinent previous records reviewed include none   Follow Up: 3 weeks after 06/14/2023 for procedure only visit Zilretta injection   Subjective:   I, Anna Conrad, am serving as a Neurosurgeon for Doctor Richardean Sale   Chief Complaint: left knee pain    HPI:    08/31/2021 Anna Conrad is a 57 y.o. female who presents to [facility] at [facility] today for f/u of L knee pain.  She was last seen by Dr. Denyse Amass on 07/20/21 and had a L knee injection.  She was also referred to PT of which she completed one session and was d/c.  Today, pt reports L knee started hurting again yesterday, but overall it's been feeling a lot better. No swelling noted.    Diagnostic testing: L knee XR- 07/20/21   Pertinent review of systems: No fevers or chills   Relevant historical information: Breast cancer history   03/08/2023 Patient states she was given CSI and 3 gel shots in August ,that worked fine until dec when the pain  returned she got another CSI in January , had a 3 series of gel shots in the beginning of march , the pain has returned the shots did not help , her knee feel like it has fluid on it   03/15/2023 Patient presents for Zilretta injection.   04/12/2023 Patient states that she is great    05/29/2023 Patient states that she is starting to have some flares when she puts pressure on it    Relevant Historical Information: History of breast cancer  Additional pertinent review of systems negative.   Current Outpatient Medications:    lisinopril-hydrochlorothiazide (PRINZIDE,ZESTORETIC) 10-12.5 MG tablet, Take 1 tablet by mouth daily., Disp: , Rfl:    valsartan (DIOVAN) 40 MG tablet, Take 40 mg by mouth daily., Disp: , Rfl:    Objective:     Vitals:   05/29/23 1003  Pulse: 73  SpO2: 98%  Weight: 197 lb (89.4 kg)  Height: 5\' 2"  (1.575 m)      Body mass index is 36.03 kg/m.    Physical Exam:    General:  awake, alert oriented, no acute distress nontoxic Skin: no suspicious lesions or rashes Neuro:sensation intact and strength 5/5 with no deficits, no atrophy, normal muscle tone Psych: No signs of anxiety, depression or other mood disorder   Left knee: Crepitus present Mild swelling No deformity Neg fluid wave, joint milking ROM Flex 110, Ext 0 TTP   medial joint line NTTP over the quad tendon, medial fem condyle, lat fem condyle,  ,  plica, patella tendon, tibial tuberostiy, fibular head, posterior fossa, pes anserine bursa, gerdy's tubercle,   lateral jt line    Gait normal     Electronically signed by:  [facility] 10:24 AM 05/29/23

## 2023-05-31 ENCOUNTER — Encounter: Payer: Self-pay | Admitting: Hematology and Oncology

## 2023-05-31 ENCOUNTER — Inpatient Hospital Stay: Payer: BC Managed Care – PPO | Attending: Hematology and Oncology | Admitting: Hematology and Oncology

## 2023-05-31 ENCOUNTER — Other Ambulatory Visit: Payer: Self-pay

## 2023-05-31 VITALS — BP 140/92 | HR 73 | Temp 97.7°F | Resp 18 | Ht 62.0 in | Wt 197.4 lb

## 2023-05-31 DIAGNOSIS — Z170 Estrogen receptor positive status [ER+]: Secondary | ICD-10-CM | POA: Insufficient documentation

## 2023-05-31 DIAGNOSIS — C50411 Malignant neoplasm of upper-outer quadrant of right female breast: Secondary | ICD-10-CM | POA: Diagnosis not present

## 2023-05-31 DIAGNOSIS — Z780 Asymptomatic menopausal state: Secondary | ICD-10-CM | POA: Diagnosis not present

## 2023-05-31 MED ORDER — LETROZOLE 2.5 MG PO TABS: 2.5000 mg | ORAL_TABLET | Freq: Every day | ORAL | 3 refills | Status: DC

## 2023-05-31 NOTE — Assessment & Plan Note (Addendum)
02/07/2018 for a clinical T1c-T2 N0, stage IA-B invasive ductal carcinoma, grade 2, estrogen receptor and progesterone receptor positive, HER-2 Neg, Ki 67: 1-10% 03/18/2018: Right lumpectomy: 1.5 cm grade 2 IDC, ER 100%, PR: 20%, Her 2 K Neg, Ki 67: 10% Mammaprint: High Risk 06/04/2018- 07/23/2018; AC-T (6 cycles of Taxol) 10/16/2018 - 12/03/2018: adjuvant XRT Genetics: Neg, A variant of uncertain significance (VUS) in the gene MSH3 was also identified c.1027+4T>C (Intronic   Current Treatment: Tamoxifen started Jan 2020, (Rt LE DVT) switched to Anastrozole 06/10/21 on hold since September 2023 (because of facial rash)   Rash on the face: ANA negative.  She went to see rheumatology who performed extensive workup and did not find any cause.   Recommendation: Discussed about starting on antiestrogen therapy back again with letrozole.  She is agreeable and I sent a new prescription today.   Breast Cancer Surveillance: Bil Mamm: 12/12/2022: Benign breast density category C  We are also ordering a bone density test. Telephone visit in 3 months to discuss tolerance to letrozole therapy.

## 2023-06-18 NOTE — Progress Notes (Unsigned)
    [name] [facility] [address] [place] [address] Phone: [phone]   Assessment and Plan:     There are no diagnoses linked to this encounter.  ***   Pertinent previous records reviewed include ***   Follow Up: ***     Subjective:   I, Anna Conrad, am serving as a Neurosurgeon for Doctor Richardean Sale   Chief Complaint: left knee pain    HPI:    08/31/2021 Anna Conrad is a 57 y.o. female who presents to [facility] at [facility] today for f/u of L knee pain.  She was last seen by Dr. Denyse Amass on 07/20/21 and had a L knee injection.  She was also referred to PT of which she completed one session and was d/c.  Today, pt reports L knee started hurting again yesterday, but overall it's been feeling a lot better. No swelling noted.    Diagnostic testing: L knee XR- 07/20/21   Pertinent review of systems: No fevers or chills   Relevant historical information: Breast cancer history   03/08/2023 Patient states she was given CSI and 3 gel shots in August ,that worked fine until dec when the pain returned she got another CSI in January , had a 3 series of gel shots in the beginning of march , the pain has returned the shots did not help , her knee feel like it has fluid on it   03/15/2023 Patient presents for Zilretta injection.   04/12/2023 Patient states that she is great    05/29/2023 Patient states that she is starting to have some flares when she puts pressure on it   06/19/2023 Patient states   Relevant Historical Information: History of breast cancer Additional pertinent review of systems negative.   Current Outpatient Medications:    desonide (DESOWEN) 0.05 % ointment, Apply 1 Application topically 2 (two) times daily., Disp: , Rfl:    letrozole (FEMARA) 2.5 MG tablet, Take 1 tablet (2.5 mg total) by mouth daily., Disp: 90 tablet, Rfl: 3   lisinopril-hydrochlorothiazide (PRINZIDE,ZESTORETIC) 10-12.5 MG  tablet, Take 1 tablet by mouth daily., Disp: , Rfl:    valsartan (DIOVAN) 40 MG tablet, Take 40 mg by mouth daily., Disp: , Rfl:    Objective:     There were no vitals filed for this visit.    There is no height or weight on file to calculate BMI.    Physical Exam:    ***   Electronically signed by:  [name] [facility] 12:33 PM 06/18/23

## 2023-06-19 ENCOUNTER — Ambulatory Visit: Payer: BC Managed Care – PPO | Admitting: Sports Medicine

## 2023-06-19 VITALS — BP 122/86 | HR 88 | Ht 62.0 in | Wt 198.0 lb

## 2023-06-19 DIAGNOSIS — M1712 Unilateral primary osteoarthritis, left knee: Secondary | ICD-10-CM | POA: Diagnosis not present

## 2023-06-19 DIAGNOSIS — G8929 Other chronic pain: Secondary | ICD-10-CM

## 2023-06-19 DIAGNOSIS — M25562 Pain in left knee: Secondary | ICD-10-CM

## 2023-06-19 MED ORDER — TRIAMCINOLONE ACETONIDE 32 MG IX SRER
32.0000 mg | Freq: Once | INTRA_ARTICULAR | Status: AC
  Administered 2023-06-19: 32 mg via INTRA_ARTICULAR

## 2023-06-19 NOTE — Patient Instructions (Signed)
As needed follow up  Call us if you want a repeat zilretta or a PRP injection

## 2023-08-08 NOTE — Progress Notes (Unsigned)
    [name] D[facility] [address] [place] [address] Phone: [phone]   Assessment and Plan:     There are no diagnoses linked to this encounter.  ***   Pertinent previous records reviewed include ***   Follow Up: ***     Subjective:   I, Dietrich Ke, am serving as a Neurosurgeon for Doctor Richardean Sale   Chief Complaint: left knee pain    HPI:    08/31/2021 Tarika Iwona Forestier is a 57 y.o. female who presents to [facility] at [facility] today for f/u of L knee pain.  She was last seen by Dr. Denyse Amass on 07/20/21 and had a L knee injection.  She was also referred to PT of which she completed one session and was d/c.  Today, pt reports L knee started hurting again yesterday, but overall it's been feeling a lot better. No swelling noted.    Diagnostic testing: L knee XR- 07/20/21   Pertinent review of systems: No fevers or chills   Relevant historical information: Breast cancer history   03/08/2023 Patient states she was given CSI and 3 gel shots in August ,that worked fine until dec when the pain returned she got another CSI in January , had a 3 series of gel shots in the beginning of march , the pain has returned the shots did not help , her knee feel like it has fluid on it   03/15/2023 Patient presents for Zilretta injection.   04/12/2023 Patient states that she is great    05/29/2023 Patient states that she is starting to have some flares when she puts pressure on it    06/19/2023 Patient states that she is okay . Pain when she is getting up from a seated position and up and down steps .   08/09/2023 Patient states   Relevant Historical Information: History of breast cancer Additional pertinent review of systems negative.   Current Outpatient Medications:    desonide (DESOWEN) 0.05 % ointment, Apply 1 Application topically 2 (two) times daily., Disp: , Rfl:    letrozole (FEMARA) 2.5 MG tablet, Take 1  tablet (2.5 mg total) by mouth daily., Disp: 90 tablet, Rfl: 3   lisinopril-hydrochlorothiazide (PRINZIDE,ZESTORETIC) 10-12.5 MG tablet, Take 1 tablet by mouth daily., Disp: , Rfl:    valsartan (DIOVAN) 40 MG tablet, Take 40 mg by mouth daily., Disp: , Rfl:    Objective:     There were no vitals filed for this visit.    There is no height or weight on file to calculate BMI.    Physical Exam:    ***   Electronically signed by:  [name] [facility] 12:05 PM 08/08/23

## 2023-08-09 ENCOUNTER — Ambulatory Visit (INDEPENDENT_AMBULATORY_CARE_PROVIDER_SITE_OTHER): Payer: BC Managed Care – PPO | Admitting: Sports Medicine

## 2023-08-09 VITALS — HR 86 | Ht 62.0 in | Wt 195.0 lb

## 2023-08-09 DIAGNOSIS — M7661 Achilles tendinitis, right leg: Secondary | ICD-10-CM

## 2023-08-09 MED ORDER — MELOXICAM 15 MG PO TABS: 15.0000 mg | ORAL_TABLET | Freq: Every day | ORAL | 0 refills | Status: AC

## 2023-08-09 NOTE — Patient Instructions (Signed)
-   Start meloxicam 15 mg daily x2 weeks.  If still having pain after 2 weeks, complete 3rd-week of meloxicam. May use remaining meloxicam as needed once daily for pain control.  Do not to use additional NSAIDs while taking meloxicam.  May use Tylenol [phone] mg 2 to 3 times a day for breakthrough pain. Achilles HEP  Recommend wearing supportive tennis shoes 4 week follow up

## 2023-08-09 NOTE — Addendum Note (Signed)
Addended by: Richardean Sale on: 08/09/2023 10:09 AM   Modules accepted: Level of Service

## 2023-08-18 ENCOUNTER — Encounter: Payer: Self-pay | Admitting: Hematology and Oncology

## 2023-08-20 ENCOUNTER — Telehealth: Payer: Self-pay | Admitting: Hematology and Oncology

## 2023-08-23 ENCOUNTER — Telehealth: Payer: BC Managed Care – PPO | Admitting: Hematology and Oncology

## 2023-09-05 ENCOUNTER — Ambulatory Visit: Payer: BC Managed Care – PPO | Admitting: Sports Medicine

## 2023-09-06 NOTE — Assessment & Plan Note (Addendum)
02/07/2018 for a clinical T1c-T2 N0, stage IA-B invasive ductal carcinoma, grade 2, estrogen receptor and progesterone receptor positive, HER-2 Neg, Ki 67: 1-10% 03/18/2018: Right lumpectomy: 1.5 cm grade 2 IDC, ER 100%, PR: 20%, Her 2 K Neg, Ki 67: 10% Mammaprint: High Risk 06/04/2018- 07/23/2018; AC-T (6 cycles of Taxol) 10/16/2018 - 12/03/2018: adjuvant XRT Genetics: Neg, A variant of uncertain significance (VUS) in the gene MSH3 was also identified c.1027+4T>C (Intronic   Current Treatment: Tamoxifen started Jan 2020, (Rt LE DVT) switched to Anastrozole 06/10/21 on hold since September 2023 (because of facial rash), started letrozole 05/31/2023   Rash on the face: ANA negative. Seen Dermatologist and she's better with a cream   Letrozole toxicities:   Breast Cancer Surveillance: Bil Mamm: 12/12/2022: Benign breast density category C   We are also ordering a bone density test. Follow-up in 1 year

## 2023-09-10 ENCOUNTER — Encounter: Payer: Self-pay | Admitting: Hematology and Oncology

## 2023-09-10 ENCOUNTER — Inpatient Hospital Stay: Payer: BC Managed Care – PPO | Attending: Hematology and Oncology | Admitting: Hematology and Oncology

## 2023-09-10 DIAGNOSIS — Z170 Estrogen receptor positive status [ER+]: Secondary | ICD-10-CM | POA: Diagnosis not present

## 2023-09-10 DIAGNOSIS — C50411 Malignant neoplasm of upper-outer quadrant of right female breast: Secondary | ICD-10-CM

## 2023-09-10 NOTE — Progress Notes (Signed)
HEMATOLOGY-ONCOLOGY TELEPHONE VISIT PROGRESS NOTE  I connected with our patient on 09/10/23 at  9:45 AM EDT by telephone and verified that I am speaking with the correct person using two identifiers.  I discussed the limitations, risks, security and privacy concerns of performing an evaluation and management service by telephone and the availability of in person appointments.  I also discussed with the patient that there may be a patient responsible charge related to this service. The patient expressed understanding and agreed to proceed.   History of Present Illness: Follow-up on letrozole therapy Discussed the use of AI scribe software for clinical note transcription with the patient, who gave verbal consent to proceed.  History of Present Illness   The patient, with a history of hormone therapy treatment, reports overall improvement since switching to letrozole in June. She notes better energy levels, improved knee function, and weight loss of about eight pounds. 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  The patient also reports a rash that has been managed with a non-steroid cream prescribed by a dermatologist. The rash was previously treated with a steroid cream, but the patient expressed concerns about long-term steroid use. The current non-steroid cream is used as needed when irritation occurs.  The patient is due for a mammogram in January, which is scheduled automatically. She also mentions a bone density test that was ordered but not yet completed. The last bone density test showed excellent bone health.        Oncology History  Malignant neoplasm of upper-outer quadrant of right breast in female, estrogen receptor positive (HCC)  03/05/2018 Initial Diagnosis   status post right breast upper outer quadrant biopsy 02/07/2018 for a clinical T1c-T2 N0, stage IA-B invasive ductal carcinoma, grade 2,  estrogen receptor and progesterone receptor positive, HER-2 not amplified, with an MIB-1 of 10%   03/13/2018 Genetic Testing   The Common Hereditary Cancer Panel offered by [facility] includes sequencing and/or deletion duplication testing of the following 47 genes: APC, ATM, AXIN2, BARD1, BMPR1A, BRCA1, BRCA2, BRIP1, CDH1, CDKN2A (p14ARF), CDKN2A (p16INK4a), CKD4, CHEK2, CTNNA1, DICER1, EPCAM (Deletion/duplication testing only), GREM1 (promoter region deletion/duplication testing only), KIT, MEN1, MLH1, MSH2, MSH3, MSH6, MUTYH, NBN, NF1, NHTL1, PALB2, PDGFRA, PMS2, POLD1, POLE, PTEN, RAD50, RAD51C, RAD51D, SDHB, SDHC, SDHD, SMAD4, SMARCA4. STK11, TP53, TSC1, TSC2, and VHL.  The following genes were evaluated for sequence changes only: SDHA and HOXB13 c.251G>A variant only.  Results: No pathogenic variants identified. A variant of uncertain significance (VUS) in the gene MSH3 was also identified c.1027+4T>C (Intronic).  VUS's should not be used to impact medical management.  The date of this test report is 03/13/2018.    03/18/2018 Surgery   status post right lumpectomy for a pT1c pN0, stage IA invasive ductal carcinoma, grade 2, estrogen and progesterone receptor positive, with negative margins.  A total of 1 lymph node was removed  Mammaprint showed high risk, indicating that with chemotherapy and hormone therapy, the patient would have a nearly 95% chance of having no distant metastases within the next 5 years   06/04/2018 - 09/13/2018 Adjuvant Chemotherapy   chemotherapy consisting of doxorubicin and cyclophosphamide in dose dense fashion x4 started 06/04/2018, completed 07/23/2018 followed by weekly paclitaxel started 08/09/2018, discontinued 09/13/2018             (a) baseline echocardiogram on 05/23/2018 showed an ejection fraction in the 65-75% range             (  b) adjuvant paclitaxel discontinued after 6 cycles because of neuropathy   10/16/2018 - 12/03/2018 Radiation Therapy   Site/dose: The patient  initially received a dose of 50.4 Gy in 28 fractions to the right breast using whole-breast tangent fields. This was delivered using a 3-D conformal technique. The patient then received a boost to the seroma. This delivered an additional 10 Gy in 5 fractions using 6x photons with a Complex Isodose technique. The total dose was 60.4 Gy.     12/2018 -  Anti-estrogen oral therapy   Tamoxifen daily switched to anastrozole when she developed blood clot July 2022     REVIEW OF SYSTEMS:   Constitutional: Denies fevers, chills or abnormal weight loss All other systems were reviewed with the patient and are negative. Observations/Objective:     Assessment Plan:  Malignant neoplasm of upper-outer quadrant of right breast in female, estrogen receptor positive (HCC) 02/07/2018 for a clinical T1c-T2 N0, stage IA-B invasive ductal carcinoma, grade 2, estrogen receptor and progesterone receptor positive, HER-2 Neg, Ki 67: 1-10% 03/18/2018: Right lumpectomy: 1.5 cm grade 2 IDC, ER 100%, PR: 20%, Her 2 K Neg, Ki 67: 10% Mammaprint: High Risk 06/04/2018- 07/23/2018; AC-T (6 cycles of Taxol) 10/16/2018 - 12/03/2018: adjuvant XRT Genetics: Neg, A variant of uncertain significance (VUS) in the gene MSH3 was also identified c.1027+4T>C (Intronic   Current Treatment: Tamoxifen started Jan 2020, (Rt LE DVT) switched to Anastrozole 06/10/21 on hold since September 2023 (because of facial rash), started letrozole 05/31/2023   Rash on the face: ANA negative.  She went to see rheumatology who performed extensive workup and did not find any cause.    Letrozole toxicities: Tolerating it significantly better with hardly significant joint pains are worsening hot flashes.   Breast Cancer Surveillance: Bil Mamm: 12/12/2022: Benign breast density category C   We are also ordering a bone density test. Return to clinic in 1 year for follow-up     Hormone Therapy for Breast Cancer Tolerating Letrozole well since switch in  June. No new side effects reported. -Continue Letrozole as prescribed.  Dermatologic Concerns Rash managed with non-steroidal cream as prescribed by dermatologist. -Continue using cream as needed for irritation.  Bone Health Last bone density scan in 2022 showed excellent bone health. Next scan due in 2024 but not yet completed. -Request scheduling of bone density scan within the next six months.  Breast Health Annual mammogram due in January. -Continue with scheduled mammogram.  Follow-up No immediate concerns. -Next in-person appointment in one year.          I discussed the assessment and treatment plan with the patient. The patient was provided an opportunity to ask questions and all were answered. The patient agreed with the plan and demonstrated an understanding of the instructions. The patient was advised to call back or seek an in-person evaluation if the symptoms worsen or if the condition fails to improve as anticipated.   I provided 12 minutes of non-face-to-face time during this encounter.  This includes time for charting and coordination of care   Tamsen Meek, MD

## 2023-12-10 ENCOUNTER — Telehealth: Payer: Self-pay | Admitting: Sports Medicine

## 2023-12-10 NOTE — Telephone Encounter (Signed)
Pt would like to be re run for Anna Conrad, last done in July. Pt will have new [facility], informed her to upload new card.

## 2023-12-13 ENCOUNTER — Encounter: Payer: Self-pay | Admitting: Sports Medicine

## 2023-12-13 NOTE — Telephone Encounter (Signed)
 Ran through flexforward portal. Waiting for insurance response

## 2023-12-13 NOTE — Telephone Encounter (Signed)
 [facility] 2025 card scanned in.

## 2023-12-17 NOTE — Telephone Encounter (Signed)
 Ran patient need to fill out a PA form today. Then waiting on insurance to respond

## 2023-12-26 NOTE — Telephone Encounter (Signed)
 Patient called to follow up on the status

## 2024-01-08 ENCOUNTER — Telehealth: Payer: Self-pay

## 2024-01-08 NOTE — Telephone Encounter (Signed)
Per [name]:  Received a fax stating no PA is required for Zilretta patient can go ahead and be scheduled when the medication is in.       Plan follows [employer] guidelines. Patient is responsible for a $80 copay for J3304 (Zilretta) with the remaining covered at 100% by the payer at contracted rate.  Patient is responsible for a $80 copay for CPT code 65784 with the remaining covered at 100% by the payer at contracted rate.   Deductibles do not apply to these services. Patient has a $80 copay whether or not an office visit is billed. Only one copay applies per date of service. Patient has an out of pocket maximum of $4890 and has accumulated $0. If out of pocket is met, copays will no longer apply. Patient has a calendar year policy starting from December 12, 2023 to December 10, 2024.

## 2024-01-08 NOTE — Telephone Encounter (Signed)
Received a fax stating no PA is required for Zilretta patient can go ahead and be scheduled when the medication is in.    Plan follows [employer] guidelines. Patient is responsible for a $80 copay for J3304 (Zilretta) with the remaining covered at 100% by the payer at contracted rate.  Patient is responsible for a $80 copay for CPT code 40981 with the remaining covered at 100% by the payer at contracted rate.  Deductibles do not apply to these services. Patient has a $80 copay whether or not an office visit is billed. Only one copay applies per date of service. Patient has an out of pocket maximum of $4890 and has accumulated $0. If out of pocket is met, copays will no longer apply. Patient has a calendar year policy starting from December 12, 2023 to December 10, 2024.

## 2024-01-08 NOTE — Telephone Encounter (Signed)
Ordered. Holding for stock to arrive.

## 2024-01-09 NOTE — Telephone Encounter (Signed)
Left message for patient to call back to schedule. Okay to schedule Friday or after.

## 2024-01-11 NOTE — Progress Notes (Deleted)
   Rubin Payor, PhD, LAT, ATC acting as a scribe for Clementeen Graham, MD.  Anna Conrad is a 58 y.o. female who presents to [facility] at [facility] today for exacerbation of her L knee pain.    Pertinent review of systems: ***  Relevant historical information: ***   Exam:  LMP 04/15/2018  General: Well Developed, well nourished, and in no acute distress.   MSK: ***    Lab and Radiology Results No results found for this or any previous visit (from the past 72 hours). No results found.     Assessment and Plan: 58 y.o. female with ***   PDMP not reviewed this encounter. No orders of the defined types were placed in this encounter.  No orders of the defined types were placed in this encounter.    Discussed warning signs or symptoms. Please see discharge instructions. Patient expresses understanding.   ***

## 2024-01-11 NOTE — Progress Notes (Unsigned)
    [name] D[facility] [address] [place] [address] Phone: [phone]   Assessment and Plan:     There are no diagnoses linked to this encounter.  ***   Pertinent previous records reviewed include ***    Follow Up: ***     Subjective:   I, Phinehas Grounds, am serving as a Neurosurgeon for Doctor Richardean Sale   Chief Complaint: left knee pain    HPI:    08/31/2021 Batool Leanne Sisler is a 58 y.o. female who presents to [facility] at [facility] today for f/u of L knee pain.  She was last seen by Dr. Denyse Amass on 07/20/21 and had a L knee injection.  She was also referred to PT of which she completed one session and was d/c.  Today, pt reports L knee started hurting again yesterday, but overall it's been feeling a lot better. No swelling noted.    Diagnostic testing: L knee XR- 07/20/21   Pertinent review of systems: No fevers or chills   Relevant historical information: Breast cancer history   03/08/2023 Patient states she was given CSI and 3 gel shots in August ,that worked fine until dec when the pain returned she got another CSI in January , had a 3 series of gel shots in the beginning of march , the pain has returned the shots did not help , her knee feel like it has fluid on it   03/15/2023 Patient presents for Zilretta injection.   04/12/2023 Patient states that she is great    05/29/2023 Patient states that she is starting to have some flares when she puts pressure on it    06/19/2023 Patient states that she is okay . Pain when she is getting up from a seated position and up and down steps .    08/09/2023 Patient states that she is great   01/14/2024 Patient states  Relevant Historical Information: History of breast cancer  Additional pertinent review of systems negative.   Current Outpatient Medications:    desonide (DESOWEN) 0.05 % ointment, Apply 1 Application topically 2 (two) times daily., Disp: , Rfl:     letrozole (FEMARA) 2.5 MG tablet, Take 1 tablet (2.5 mg total) by mouth daily., Disp: 90 tablet, Rfl: 3   lisinopril-hydrochlorothiazide (PRINZIDE,ZESTORETIC) 10-12.5 MG tablet, Take 1 tablet by mouth daily., Disp: , Rfl:    meloxicam (MOBIC) 15 MG tablet, Take 1 tablet (15 mg total) by mouth daily., Disp: 30 tablet, Rfl: 0   valsartan (DIOVAN) 40 MG tablet, Take 40 mg by mouth daily., Disp: , Rfl:    Objective:     There were no vitals filed for this visit.    There is no height or weight on file to calculate BMI.    Physical Exam:    ***   Electronically signed by:  [name] [facility] 3:16 PM 01/11/24

## 2024-01-14 ENCOUNTER — Ambulatory Visit: Payer: 59 | Admitting: Family Medicine

## 2024-01-14 ENCOUNTER — Ambulatory Visit (INDEPENDENT_AMBULATORY_CARE_PROVIDER_SITE_OTHER): Payer: 59 | Admitting: Sports Medicine

## 2024-01-14 VITALS — BP 132/90 | HR 88 | Ht 62.0 in | Wt 195.0 lb

## 2024-01-14 DIAGNOSIS — G8929 Other chronic pain: Secondary | ICD-10-CM

## 2024-01-14 DIAGNOSIS — M25562 Pain in left knee: Secondary | ICD-10-CM | POA: Diagnosis not present

## 2024-01-14 DIAGNOSIS — M1712 Unilateral primary osteoarthritis, left knee: Secondary | ICD-10-CM

## 2024-01-14 MED ORDER — TRIAMCINOLONE ACETONIDE 32 MG IX SRER
32.0000 mg | Freq: Once | INTRA_ARTICULAR | Status: AC
  Administered 2024-01-14: 32 mg via INTRA_ARTICULAR

## 2024-01-21 ENCOUNTER — Other Ambulatory Visit: Payer: Self-pay | Admitting: Hematology and Oncology

## 2024-01-21 DIAGNOSIS — Z1231 Encounter for screening mammogram for malignant neoplasm of breast: Secondary | ICD-10-CM

## 2024-02-06 ENCOUNTER — Ambulatory Visit
Admission: RE | Admit: 2024-02-06 | Discharge: 2024-02-06 | Disposition: A | Discharge status: Home / Self Care | Payer: 59 | Source: Ambulatory Visit | Attending: Hematology and Oncology | Admitting: Hematology and Oncology

## 2024-02-06 DIAGNOSIS — Z1231 Encounter for screening mammogram for malignant neoplasm of breast: Secondary | ICD-10-CM

## 2024-03-24 ENCOUNTER — Telehealth: Payer: Self-pay | Admitting: Sports Medicine

## 2024-03-24 NOTE — Telephone Encounter (Signed)
 Patient scheduled for 04/21/24. Can you confirm authorization?

## 2024-03-24 NOTE — Telephone Encounter (Signed)
 Copied from CRM [id number]. Topic: Appointments - Scheduling Inquiry for Clinic >> Mar 24, 2024 11:09 AM Alethia Huxley E wrote: Reason for CRM: Patient called stating that she usually sees Dr. Ulysees Gander for a Zilretta injection on her left knee. Patient stated that her knee is bugging her again and would like to get this scheduled. Callback number for patient is [phone].

## 2024-04-18 NOTE — Progress Notes (Unsigned)
    Ben Jackson D.[facility] [address] [place] [address] Phone: [phone]   Assessment and Plan:     There are no diagnoses linked to this encounter.  ***   Pertinent previous records reviewed include ***    Follow Up: ***     Subjective:   I, Tiffany Talarico, am serving as a Neurosurgeon for Doctor Ulysees Gander   Chief Complaint: left knee pain    HPI:    08/31/2021 Anna Conrad is a 58 y.o. female who presents to [facility] at [facility] today for f/u of L knee pain.  She was last seen by Dr. Alease Hunter on 07/20/21 and had a L knee injection.  She was also referred to PT of which she completed one session and was d/c.  Today, pt reports L knee started hurting again yesterday, but overall it's been feeling a lot better. No swelling noted.    Diagnostic testing: L knee XR- 07/20/21   Pertinent review of systems: No fevers or chills   Relevant historical information: Breast cancer history   03/08/2023 Patient states she was given CSI and 3 gel shots in August ,that worked fine until dec when the pain returned she got another CSI in January , had a 3 series of gel shots in the beginning of march , the pain has returned the shots did not help , her knee feel like it has fluid on it   03/15/2023 Patient presents for Zilretta  injection.   04/12/2023 Patient states that she is great    05/29/2023 Patient states that she is starting to have some flares when she puts pressure on it    06/19/2023 Patient states that she is okay . Pain when she is getting up from a seated position and up and down steps .    08/09/2023 Patient states that she is great    01/14/2024 Patient states Left knee has hurt for months  04/21/2024 Patient states   Relevant Historical Information: History of breast cancer  Additional pertinent review of systems negative.   Current Outpatient Medications:    desonide (DESOWEN) 0.05 % ointment,  Apply 1 Application topically 2 (two) times daily., Disp: , Rfl:    letrozole  (FEMARA ) 2.5 MG tablet, Take 1 tablet (2.5 mg total) by mouth daily., Disp: 90 tablet, Rfl: 3   meloxicam  (MOBIC ) 15 MG tablet, Take 1 tablet (15 mg total) by mouth daily., Disp: 30 tablet, Rfl: 0   valsartan (DIOVAN) 40 MG tablet, Take 40 mg by mouth daily., Disp: , Rfl:    Objective:     There were no vitals filed for this visit.    There is no height or weight on file to calculate BMI.    Physical Exam:    ***   Electronically signed by:  Marshall Skeeter D.[facility] 11:37 AM 04/18/24

## 2024-04-21 ENCOUNTER — Ambulatory Visit: Admitting: Sports Medicine

## 2024-04-21 VITALS — HR 86 | Ht 62.0 in | Wt 198.0 lb

## 2024-04-21 DIAGNOSIS — M25562 Pain in left knee: Secondary | ICD-10-CM

## 2024-04-21 DIAGNOSIS — G8929 Other chronic pain: Secondary | ICD-10-CM

## 2024-04-21 DIAGNOSIS — M1712 Unilateral primary osteoarthritis, left knee: Secondary | ICD-10-CM | POA: Diagnosis not present

## 2024-04-21 MED ORDER — TRIAMCINOLONE ACETONIDE 32 MG IX SRER
32.0000 mg | Freq: Once | INTRA_ARTICULAR | Status: AC
Start: 2024-04-21 — End: 2024-04-21
  Administered 2024-04-21: 32 mg via INTRA_ARTICULAR

## 2024-04-21 NOTE — Patient Instructions (Signed)
 Recommend stationary bike, water aerobics, elliptical  Recommend tumeric  Recommend calling and asking for new zilretta  prior auth

## 2024-07-18 ENCOUNTER — Other Ambulatory Visit: Payer: Self-pay | Admitting: Hematology and Oncology

## 2024-07-22 ENCOUNTER — Telehealth: Payer: Self-pay | Admitting: Sports Medicine

## 2024-07-22 NOTE — Telephone Encounter (Signed)
 Patient called and states her knee is bothering her again it is swollen and painful and wanted to check to see if zilretta  has been authorized for her to have it again. Please advise.

## 2024-07-23 NOTE — Telephone Encounter (Signed)
 Patient re-ran for Zilretta for L knee on 07/23/2024. Case ID: [id number]. Pending approval.

## 2024-07-24 NOTE — Telephone Encounter (Signed)
 Availatiy states no authorization required for CPT code 79389. However, [id number] is clear whether or not it is covered by insurance. Completed a covermymeds medication authorization. KEY: [id number]. Pending approval.

## 2024-07-24 NOTE — Telephone Encounter (Signed)
 Fax received from patient's insurance ([employer]) stating denial for Zilretta .   It was denied because it wants patient to try 3 of the preferred drugs and prove that they were ineffective prior to giving approval for Zilretta .   Preferred drugs: Triamcinolone  acetonide injection Methylprednisolone  acetate injection Requires 3 in a class with 3 or more alternatives, 2 in a class with 2 alternatives, or 1 in a class with only 1 alternative.   Copy of denial with be placed in media.

## 2024-07-25 NOTE — Telephone Encounter (Signed)
 I looked into the billing side. When patient was given Zilretta  on 06/19/23, she had [employer] through the [facility]. For 2025, she has [employer] so benefits are different.   Date of service 01/14/24 has an insurance balance of $1,331.52 and date of service 04/21/24 has an insurance balance of $1,523.00 due to denied claims.  As of 7/31, billing notes state for DOS 01/14/24: As per eob found the claim got denied as disposition of clm/svc pend review. as per prior notes found Considering the high dollar and need to submit the second level appeal. Appeal received and is still in review. The finalization will be on 08/22/2024  As of 7/23 for DOS 04/21/24 billing states claim was denied and medical records are still under review.   We may need to proceed with preferred medication or try approval for gel to avoid another denied claim?

## 2024-07-25 NOTE — Telephone Encounter (Signed)
 Yes! Spoke to patient and explained. She is scheduled for 8/21 to discuss other options.

## 2024-07-30 NOTE — Progress Notes (Unsigned)
    [name] [address] [place] [address] Phone: [phone]   Assessment and Plan:     There are no diagnoses linked to this encounter.  ***   Pertinent previous records reviewed include ***    Follow Up: ***     Subjective:    Chief Complaint: ***  HPI:   03/08/2023 Patient states she was given CSI and 3 gel shots in August ,that worked fine until dec when the pain returned she got another CSI in January , had a 3 series of gel shots in the beginning of march , the pain has returned the shots did not help , her knee feel like it has fluid on it   03/15/2023 Patient presents for Zilretta  injection.   04/12/2023 Patient states that she is great    05/29/2023 Patient states that she is starting to have some flares when she puts pressure on it    06/19/2023 Patient states that she is okay . Pain when she is getting up from a seated position and up and down steps .    08/09/2023 Patient states that she is great    01/14/2024 Patient states Left knee has hurt for months  07/31/24 Patient is here to discuss other options since Zilretta  injection authorization was denied. Patient states   Relevant Historical Information: History of Breast Cancer  Additional pertinent review of systems negative.   Current Outpatient Medications:    desonide (DESOWEN) 0.05 % ointment, Apply 1 Application topically 2 (two) times daily., Disp: , Rfl:    letrozole  (FEMARA ) 2.5 MG tablet, TAKE 1 TABLET (2.5 MG TOTAL) BY MOUTH DAILY., Disp: 90 tablet, Rfl: 3   meloxicam  (MOBIC ) 15 MG tablet, Take 1 tablet (15 mg total) by mouth daily., Disp: 30 tablet, Rfl: 0   valsartan (DIOVAN) 40 MG tablet, Take 40 mg by mouth daily., Disp: , Rfl:    Objective:     There were no vitals filed for this visit.    There is no height or weight on file to calculate BMI.    Physical Exam:    ***   Electronically signed by:  Anna Conrad D.CLEMENTEEN [name] 5:01 PM 07/30/24

## 2024-07-31 ENCOUNTER — Ambulatory Visit: Admitting: Sports Medicine

## 2024-07-31 VITALS — BP 114/70 | HR 105 | Ht 62.0 in | Wt 199.4 lb

## 2024-07-31 DIAGNOSIS — G8929 Other chronic pain: Secondary | ICD-10-CM

## 2024-07-31 DIAGNOSIS — M25562 Pain in left knee: Secondary | ICD-10-CM | POA: Diagnosis not present

## 2024-07-31 DIAGNOSIS — M1712 Unilateral primary osteoarthritis, left knee: Secondary | ICD-10-CM

## 2024-07-31 NOTE — Patient Instructions (Addendum)
 Good to see you  Referred to GAE procedure look over pamphlet Left knee MRI ordered  Follow up with us  as needed

## 2024-08-04 ENCOUNTER — Encounter: Payer: Self-pay | Admitting: Sports Medicine

## 2024-08-12 ENCOUNTER — Other Ambulatory Visit

## 2024-08-26 ENCOUNTER — Other Ambulatory Visit

## 2024-08-26 NOTE — Progress Notes (Unsigned)
 [name] [address] [place] [address] Phone: [phone]   Assessment and Plan:     1. Primary osteoarthritis of left knee (Primary) 2. Chronic pain of left knee -Chronic with exacerbation, subsequent visit - Continued flare of left knee pain consistent with flare of osteoarthritis.  Mild improvement with conservative therapy including turmeric, bracing, activity modification, supplements - Patient was receiving significant relief from Zilretta  injections, however unfortunately, patient's insurance is now declining Zilretta  injections. - I feel patient could benefit from GAE procedure, however unfortunately patient's insurance is declining GAE procedure - Use Tylenol  500 to 1000 mg tablets 2-3 times a day for day-to-day pain relief - Use Celebrex  200 mg daily as needed for pain.  Recommend limiting chronic NSAIDs to 1-2 doses per week to prevent long-term side effects.  -Continue home exercise program, physical activity as tolerated, turmeric, weight loss journey    Pertinent previous records reviewed include none   Follow Up: As needed to discuss knee MRI versus repeat CSI   Subjective:   I, Anna Conrad, am serving as a Neurosurgeon for [employer]  Chief Complaint: Continued left knee pain   HPI:    03/08/2023 Patient states she was given CSI and 3 gel shots in August ,that worked fine until dec when the pain returned she got another CSI in January , had a 3 series of gel shots in the beginning of march , the pain has returned the shots did not help , her knee feel like it has fluid on it   03/15/2023 Patient presents for Zilretta  injection.   04/12/2023 Patient states that she is great    05/29/2023 Patient states that she is starting to have some flares when she puts pressure on it    06/19/2023 Patient states that she is okay . Pain when she is getting up from a seated position and up and down steps .     08/09/2023 Patient states that she is great    01/14/2024 Patient states Left knee has hurt for months   07/31/24 Patient is here to discuss other options since Zilretta  injection authorization was denied. Patient states that it is swollen right now. Ankles are swelling and causing pain. Hips are also giving her issues. Other knee gives her some issues as well but the hips and ankles are priority right now.   08/27/2024 Patient states here for other options other than ablation.    Relevant Historical Information: History of Breast Cancer  Additional pertinent review of systems negative.   Current Outpatient Medications:    desonide (DESOWEN) 0.05 % ointment, Apply 1 Application topically 2 (two) times daily., Disp: , Rfl:    letrozole  (FEMARA ) 2.5 MG tablet, TAKE 1 TABLET (2.5 MG TOTAL) BY MOUTH DAILY., Disp: 90 tablet, Rfl: 3   meloxicam  (MOBIC ) 15 MG tablet, Take 1 tablet (15 mg total) by mouth daily., Disp: 30 tablet, Rfl: 0   valsartan (DIOVAN) 40 MG tablet, Take 40 mg by mouth daily., Disp: , Rfl:    Objective:     Vitals:   08/27/24 1321  Pulse: 94  SpO2: 98%  Weight: 185 lb (83.9 kg)  Height: 5' 2 (1.575 m)      Body mass index is 33.84 kg/m.    Physical Exam:    General:  awake, alert oriented, no acute distress nontoxic Skin: no suspicious lesions or rashes Neuro:sensation intact and strength 5/5 with no deficits, no atrophy, normal muscle  tone Psych: No signs of anxiety, depression or other mood disorder   Left knee: Crepitus present Mild swelling No deformity Neg fluid wave, joint milking ROM Flex 110, Ext 0 TTP   medial joint line NTTP over the quad tendon, medial fem condyle, lat fem condyle,  , plica, patella tendon, tibial tuberostiy, fibular head, posterior fossa, pes anserine bursa, gerdy's tubercle,   lateral jt line    Gait normal       Electronically signed by:  Odis Mace D.CLEMENTEEN AM[facility] 1:38 PM 08/27/24

## 2024-08-27 ENCOUNTER — Ambulatory Visit (INDEPENDENT_AMBULATORY_CARE_PROVIDER_SITE_OTHER): Admitting: Sports Medicine

## 2024-08-27 ENCOUNTER — Other Ambulatory Visit

## 2024-08-27 VITALS — HR 94 | Ht 62.0 in | Wt 185.0 lb

## 2024-08-27 DIAGNOSIS — M1712 Unilateral primary osteoarthritis, left knee: Secondary | ICD-10-CM | POA: Diagnosis not present

## 2024-08-27 DIAGNOSIS — M25562 Pain in left knee: Secondary | ICD-10-CM | POA: Diagnosis not present

## 2024-08-27 DIAGNOSIS — G8929 Other chronic pain: Secondary | ICD-10-CM | POA: Diagnosis not present

## 2024-08-27 MED ORDER — CELECOXIB 200 MG PO CAPS: 200.0000 mg | ORAL_CAPSULE | Freq: Every day | ORAL | 1 refills | Status: AC

## 2024-08-27 NOTE — Patient Instructions (Addendum)
 Tylenol  [id number] mg 2-3 times a day for pain relief   - Start Celebrex  200 mg daily x2 weeks.  If still having pain after 2 weeks, complete 3rd-week of NSAID. May use remaining NSAID as needed once daily for pain control.  Do not to use additional over-the-counter NSAIDs (ibuprofen, naproxen, Advil, Aleve, etc.) while taking prescription NSAIDs.  May use Tylenol  [id number] mg 2 to 3 times a day for breakthrough pain.  As needed follow up if you want MRI or injections

## 2024-09-09 ENCOUNTER — Inpatient Hospital Stay: Payer: BC Managed Care – PPO | Attending: Hematology and Oncology | Admitting: Hematology and Oncology

## 2024-09-09 VITALS — BP 117/71 | HR 66 | Temp 97.8°F | Resp 18 | Ht 62.0 in | Wt 194.1 lb

## 2024-09-09 DIAGNOSIS — C50411 Malignant neoplasm of upper-outer quadrant of right female breast: Secondary | ICD-10-CM | POA: Diagnosis present

## 2024-09-09 DIAGNOSIS — Z170 Estrogen receptor positive status [ER+]: Secondary | ICD-10-CM | POA: Insufficient documentation

## 2024-09-09 DIAGNOSIS — Z780 Asymptomatic menopausal state: Secondary | ICD-10-CM

## 2024-09-09 DIAGNOSIS — M179 Osteoarthritis of knee, unspecified: Secondary | ICD-10-CM | POA: Insufficient documentation

## 2024-09-09 DIAGNOSIS — Z79811 Long term (current) use of aromatase inhibitors: Secondary | ICD-10-CM | POA: Insufficient documentation

## 2024-09-09 MED ORDER — HYDROXYCHLOROQUINE SULFATE 200 MG PO TABS: 200.0000 mg | ORAL_TABLET | Freq: Every day | ORAL | 0 refills | Status: DC

## 2024-09-09 NOTE — Progress Notes (Signed)
 Patient Care Team: Odean Potts, MD as PCP - General (Hematology and Oncology) Dewey Rush, MD as Consulting Physician (Radiation Oncology) Vernetta Berg, MD as Consulting Physician (General Surgery) Ozan, Jennifer, DO as Consulting Physician (Obstetrics and Gynecology) Joane Artist RAMAN, MD as Consulting Physician (Family Medicine)  DIAGNOSIS:  Encounter Diagnoses  Name Primary?   Malignant neoplasm of upper-outer quadrant of right breast in female, estrogen receptor positive (HCC) Yes   Postmenopausal     SUMMARY OF ONCOLOGIC HISTORY: Oncology History  Malignant neoplasm of upper-outer quadrant of right breast in female, estrogen receptor positive (HCC)  03/05/2018 Initial Diagnosis   status post right breast upper outer quadrant biopsy 02/07/2018 for a clinical T1c-T2 N0, stage IA-B invasive ductal carcinoma, grade 2, estrogen receptor and progesterone receptor positive, HER-2 not amplified, with an MIB-1 of 10%   03/13/2018 Genetic Testing   The Common Hereditary Cancer Panel offered by [facility] includes sequencing and/or deletion duplication testing of the following 47 genes: APC, ATM, AXIN2, BARD1, BMPR1A, BRCA1, BRCA2, BRIP1, CDH1, CDKN2A (p14ARF), CDKN2A (p16INK4a), CKD4, CHEK2, CTNNA1, DICER1, EPCAM (Deletion/duplication testing only), GREM1 (promoter region deletion/duplication testing only), KIT, MEN1, MLH1, MSH2, MSH3, MSH6, MUTYH, NBN, NF1, NHTL1, PALB2, PDGFRA, PMS2, POLD1, POLE, PTEN, RAD50, RAD51C, RAD51D, SDHB, SDHC, SDHD, SMAD4, SMARCA4. STK11, TP53, TSC1, TSC2, and VHL.  The following genes were evaluated for sequence changes only: SDHA and HOXB13 c.251G>A variant only.  Results: No pathogenic variants identified. A variant of uncertain significance (VUS) in the gene MSH3 was also identified c.1027+4T>C (Intronic).  VUS's should not be used to impact medical management.  The date of this test report is 03/13/2018.    03/18/2018 Surgery   status post right lumpectomy for a  pT1c pN0, stage IA invasive ductal carcinoma, grade 2, estrogen and progesterone receptor positive, with negative margins.  A total of 1 lymph node was removed  Mammaprint showed high risk, indicating that with chemotherapy and hormone therapy, the patient would have a nearly 95% chance of having no distant metastases within the next 5 years   06/04/2018 - 09/13/2018 Adjuvant Chemotherapy   chemotherapy consisting of doxorubicin  and cyclophosphamide  in dose dense fashion x4 started 06/04/2018, completed 07/23/2018 followed by weekly paclitaxel  started 08/09/2018, discontinued 09/13/2018             (a) baseline echocardiogram on 05/23/2018 showed an ejection fraction in the 65-75% range             (b) adjuvant paclitaxel  discontinued after 6 cycles because of neuropathy   10/16/2018 - 12/03/2018 Radiation Therapy   Site/dose: The patient initially received a dose of 50.4 Gy in 28 fractions to the right breast using whole-breast tangent fields. This was delivered using a 3-D conformal technique. The patient then received a boost to the seroma. This delivered an additional 10 Gy in 5 fractions using 6x photons with a Complex Isodose technique. The total dose was 60.4 Gy.     12/2018 -  Anti-estrogen oral therapy   Tamoxifen  daily switched to anastrozole  when she developed blood clot July 2022     CHIEF COMPLIANT: Follow-up on anastrozole  therapy  HISTORY OF PRESENT ILLNESS:   History of Present Illness Anna Conrad is a 58 year old female with a history of breast cancer who presents for follow-up of breast cancer and knee pain.  She completed radiation therapy for breast cancer in December 2019 and began letrozole  therapy in January 2020, nearing the end of a seven-year treatment plan. Her mammograms have been  satisfactory. She has not had a bone density test since July 2022 and plans to have it repeated.  She experiences ongoing knee pain primarily due to osteoarthritis. She uses a knee  brace and is exploring management options, including weight loss, supplements, and exercises, to avoid knee replacement surgery.     ALLERGIES:  is allergic to adhesive [tape], anastrozole , and latex.  MEDICATIONS:  Current Outpatient Medications  Medication Sig Dispense Refill   hydroxychloroquine (PLAQUENIL) 200 MG tablet Take 1 tablet (200 mg total) by mouth daily. 30 tablet 0   celecoxib  (CELEBREX ) 200 MG capsule Take 1 capsule (200 mg total) by mouth daily. 60 capsule 1   desonide (DESOWEN) 0.05 % ointment Apply 1 Application topically 2 (two) times daily.     letrozole  (FEMARA ) 2.5 MG tablet TAKE 1 TABLET (2.5 MG TOTAL) BY MOUTH DAILY. 90 tablet 3   meloxicam  (MOBIC ) 15 MG tablet Take 1 tablet (15 mg total) by mouth daily. 30 tablet 0   valsartan (DIOVAN) 40 MG tablet Take 40 mg by mouth daily.     No current facility-administered medications for this visit.    PHYSICAL EXAMINATION: ECOG PERFORMANCE STATUS: 1 - Symptomatic but completely ambulatory  Vitals:   09/09/24 1150  BP: 117/71  Pulse: 66  Resp: 18  Temp: 97.8 F (36.6 C)  SpO2: 100%   Filed Weights   09/09/24 1150  Weight: 194 lb 1.6 oz (88 kg)      LABORATORY DATA:  I have reviewed the data as listed    Latest Ref Rng & Units 05/30/2022    3:03 PM 10/12/2021    3:14 PM 10/26/2020   10:12 AM  CMP  Glucose 70 - 99 mg/dL 92  99  90   BUN 6 - 20 mg/dL 21  13  12    Creatinine 0.44 - 1.00 mg/dL 8.83  9.04  9.00   Sodium 135 - 145 mmol/L 139  139  141   Potassium 3.5 - 5.1 mmol/L 3.8  3.8  4.3   Chloride 98 - 111 mmol/L 104  107  106   CO2 22 - 32 mmol/L 29  25  28    Calcium 8.9 - 10.3 mg/dL 9.3  8.5  8.8   Total Protein 6.5 - 8.1 g/dL 7.0  6.7  6.9   Total Bilirubin 0.3 - 1.2 mg/dL 0.3  0.5  0.6   Alkaline Phos 38 - 126 U/L 52  59  46   AST 15 - 41 U/L 20  49  18   ALT 0 - 44 U/L 23  86  11     Lab Results  Component Value Date   WBC 7.7 09/18/2022   HGB 13.3 09/18/2022   HCT 37.9 09/18/2022    MCV 95.5 09/18/2022   PLT 281 09/18/2022   NEUTROABS 4.5 09/18/2022    ASSESSMENT & PLAN:  Malignant neoplasm of upper-outer quadrant of right breast in female, estrogen receptor positive (HCC) 02/07/2018 for a clinical T1c-T2 N0, stage IA-B invasive ductal carcinoma, grade 2, estrogen receptor and progesterone receptor positive, HER-2 Neg, Ki 67: 1-10% 03/18/2018: Right lumpectomy: 1.5 cm grade 2 IDC, ER 100%, PR: 20%, Her 2 K Neg, Ki 67: 10% Mammaprint: High Risk 06/04/2018- 07/23/2018; AC-T (6 cycles of Taxol ) 10/16/2018 - 12/03/2018: adjuvant XRT Genetics: Neg, A variant of uncertain significance (VUS) in the gene MSH3 was also identified c.1027+4T>C (Intronic   Current Treatment: Tamoxifen  started Jan 2020, (Rt LE DVT) switched to Anastrozole  06/10/21 on hold  since September 2023 (because of facial rash), started letrozole  05/31/2023   Rash on the face: ANA negative.  She went to see rheumatology who performed extensive workup and did not find any cause.    Letrozole  toxicities: Tolerating it significantly better with hardly significant joint pains are worsening hot flashes.   Breast Cancer Surveillance: Bil Mamm: 02/08/2024: Benign breast density category C   We need to order a bone density test. Return to clinic in 1 year for follow-up ------------------------------------- Assessment and Plan Assessment & Plan Estrogen receptor positive malignant neoplasm of upper-outer quadrant of right breast Five years into Letrozole  therapy post-radiation. Mammograms satisfactory. - Continue Letrozole  oral daily for two more years. - Provide brochure on Gardant blood test for circulating tumor DNA. - Discuss Gardant test; if interested, set up the test.  Osteoarthritis of knee Confirmed osteoarthritis. Previous injections not covered. Exploring non-surgical options. Agreed to trial hydroxychloroquine despite potential side effects. - Prescribe hydroxychloroquine 200 mg, one tablet daily for 30  days. - Arrange bone density test at [facility].      Orders Placed This Encounter  Procedures   DG Bone Density    Standing Status:   Future    Expected Date:   12/09/2024    Expiration Date:   09/09/2025    Reason for Exam (SYMPTOM  OR DIAGNOSIS REQUIRED):   post menopausal    Is patient pregnant?:   No    Preferred imaging location?:   [facility]    Release to patient:   Immediate   The patient has a good understanding of the overall plan. she agrees with it. she will call with any problems that may develop before the next visit here. Total time spent: 30 mins including face to face time and time spent for planning, charting and co-ordination of care   Viinay K Aston Lawhorn, MD 09/09/24

## 2024-09-09 NOTE — Assessment & Plan Note (Signed)
 02/07/2018 for a clinical T1c-T2 N0, stage IA-B invasive ductal carcinoma, grade 2, estrogen receptor and progesterone receptor positive, HER-2 Neg, Ki 67: 1-10% 03/18/2018: Right lumpectomy: 1.5 cm grade 2 IDC, ER 100%, PR: 20%, Her 2 K Neg, Ki 67: 10% Mammaprint: High Risk 06/04/2018- 07/23/2018; AC-T (6 cycles of Taxol ) 10/16/2018 - 12/03/2018: adjuvant XRT Genetics: Neg, A variant of uncertain significance (VUS) in the gene MSH3 was also identified c.1027+4T>C (Intronic   Current Treatment: Tamoxifen  started Jan 2020, (Rt LE DVT) switched to Anastrozole  06/10/21 on hold since September 2023 (because of facial rash), started letrozole  05/31/2023   Rash on the face: ANA negative.  She went to see rheumatology who performed extensive workup and did not find any cause.    Letrozole  toxicities: Tolerating it significantly better with hardly significant joint pains are worsening hot flashes.   Breast Cancer Surveillance: Bil Mamm: 02/08/2024: Benign breast density category C   We need to order a bone density test. Return to clinic in 1 year for follow-up

## 2024-10-08 ENCOUNTER — Other Ambulatory Visit: Payer: Self-pay | Admitting: *Deleted

## 2024-10-08 ENCOUNTER — Encounter: Payer: Self-pay | Admitting: Hematology and Oncology

## 2024-10-08 ENCOUNTER — Other Ambulatory Visit: Payer: Self-pay | Admitting: Hematology and Oncology

## 2024-10-08 MED ORDER — HYDROXYCHLOROQUINE SULFATE 200 MG PO TABS: 200.0000 mg | ORAL_TABLET | Freq: Every day | ORAL | 0 refills | Status: AC

## 2025-09-09 ENCOUNTER — Ambulatory Visit: Admitting: Hematology and Oncology
